# Patient Record
Sex: Female | Born: 1955 | Race: White | Hispanic: No | Marital: Married | Smoking: Former smoker
Health system: Southern US, Community
[De-identification: ages and names within clinical notes are randomized; demographics above are authoritative.]

## PROBLEM LIST (undated history)

## (undated) DIAGNOSIS — J309 Allergic rhinitis, unspecified: Secondary | ICD-10-CM

## (undated) DIAGNOSIS — J209 Acute bronchitis, unspecified: Secondary | ICD-10-CM

## (undated) DIAGNOSIS — I10 Essential (primary) hypertension: Secondary | ICD-10-CM

## (undated) DIAGNOSIS — F39 Unspecified mood [affective] disorder: Secondary | ICD-10-CM

## (undated) DIAGNOSIS — K430 Incisional hernia with obstruction, without gangrene: Secondary | ICD-10-CM

## (undated) DIAGNOSIS — L02219 Cutaneous abscess of trunk, unspecified: Secondary | ICD-10-CM

## (undated) DIAGNOSIS — R0602 Shortness of breath: Secondary | ICD-10-CM

## (undated) DIAGNOSIS — M79609 Pain in unspecified limb: Secondary | ICD-10-CM

## (undated) DIAGNOSIS — Z87891 Personal history of nicotine dependence: Secondary | ICD-10-CM

## (undated) DIAGNOSIS — E669 Obesity, unspecified: Secondary | ICD-10-CM

## (undated) DIAGNOSIS — G4700 Insomnia, unspecified: Secondary | ICD-10-CM

## (undated) DIAGNOSIS — K219 Gastro-esophageal reflux disease without esophagitis: Secondary | ICD-10-CM

## (undated) DIAGNOSIS — J019 Acute sinusitis, unspecified: Secondary | ICD-10-CM

## (undated) DIAGNOSIS — L03319 Cellulitis of trunk, unspecified: Secondary | ICD-10-CM

## (undated) DIAGNOSIS — R05 Cough: Secondary | ICD-10-CM

## (undated) HISTORY — DX: Unspecified mood (affective) disorder: F39

## (undated) HISTORY — DX: Personal history of nicotine dependence: Z87.891

## (undated) HISTORY — PX: CHOLECYSTECTOMY: SHX55

## (undated) HISTORY — DX: Cough: R05

## (undated) HISTORY — DX: Obesity, unspecified: E66.9

## (undated) HISTORY — DX: Gastro-esophageal reflux disease without esophagitis: K21.9

## (undated) HISTORY — DX: Acute bronchitis, unspecified: J20.9

## (undated) HISTORY — DX: Incisional hernia with obstruction, without gangrene: K43.0

## (undated) HISTORY — DX: Cellulitis of trunk, unspecified: L03.319

## (undated) HISTORY — DX: Shortness of breath: R06.02

## (undated) HISTORY — DX: Allergic rhinitis, unspecified: J30.9

## (undated) HISTORY — DX: Cutaneous abscess of trunk, unspecified: L02.219

## (undated) HISTORY — PX: ABDOMINAL HYSTERECTOMY: SHX81

## (undated) HISTORY — PX: ERCP: SHX60

## (undated) HISTORY — DX: Acute sinusitis, unspecified: J01.90

## (undated) HISTORY — DX: Essential (primary) hypertension: I10

## (undated) HISTORY — DX: Insomnia, unspecified: G47.00

## (undated) HISTORY — DX: Pain in unspecified limb: M79.609

---

## 1999-11-10 ENCOUNTER — Encounter (INDEPENDENT_AMBULATORY_CARE_PROVIDER_SITE_OTHER): Payer: Self-pay

## 1999-11-10 ENCOUNTER — Other Ambulatory Visit: Admission: RE | Admit: 1999-11-10 | Discharge: 1999-11-10 | Payer: Self-pay | Admitting: *Deleted

## 2001-07-18 ENCOUNTER — Encounter: Payer: Self-pay | Admitting: Obstetrics and Gynecology

## 2001-07-18 ENCOUNTER — Encounter: Admission: RE | Admit: 2001-07-18 | Discharge: 2001-07-18 | Payer: Self-pay | Admitting: Obstetrics and Gynecology

## 2004-08-24 ENCOUNTER — Ambulatory Visit: Payer: Self-pay | Admitting: Gastroenterology

## 2004-09-07 ENCOUNTER — Ambulatory Visit: Payer: Self-pay | Admitting: Gastroenterology

## 2004-09-10 ENCOUNTER — Ambulatory Visit: Payer: Self-pay | Admitting: Gastroenterology

## 2004-09-10 ENCOUNTER — Observation Stay (HOSPITAL_COMMUNITY): Admission: RE | Admit: 2004-09-10 | Discharge: 2004-09-11 | Payer: Self-pay | Admitting: Gastroenterology

## 2004-09-14 ENCOUNTER — Ambulatory Visit: Payer: Self-pay | Admitting: Gastroenterology

## 2004-09-14 ENCOUNTER — Ambulatory Visit (HOSPITAL_COMMUNITY): Admission: RE | Admit: 2004-09-14 | Discharge: 2004-09-14 | Payer: Self-pay | Admitting: Gastroenterology

## 2004-09-28 ENCOUNTER — Ambulatory Visit: Payer: Self-pay | Admitting: Gastroenterology

## 2004-11-30 ENCOUNTER — Inpatient Hospital Stay (HOSPITAL_COMMUNITY): Admission: EM | Admit: 2004-11-30 | Discharge: 2004-12-17 | Payer: Self-pay | Admitting: *Deleted

## 2004-11-30 ENCOUNTER — Ambulatory Visit (HOSPITAL_COMMUNITY): Admission: RE | Admit: 2004-11-30 | Discharge: 2004-11-30 | Payer: Self-pay | Admitting: Gastroenterology

## 2004-11-30 ENCOUNTER — Ambulatory Visit: Payer: Self-pay | Admitting: Gastroenterology

## 2004-12-04 ENCOUNTER — Ambulatory Visit: Payer: Self-pay | Admitting: Internal Medicine

## 2004-12-20 ENCOUNTER — Ambulatory Visit (HOSPITAL_COMMUNITY): Admission: RE | Admit: 2004-12-20 | Discharge: 2004-12-20 | Payer: Self-pay | Admitting: Gastroenterology

## 2004-12-22 ENCOUNTER — Ambulatory Visit: Payer: Self-pay | Admitting: Gastroenterology

## 2004-12-28 ENCOUNTER — Encounter: Admission: RE | Admit: 2004-12-28 | Discharge: 2004-12-28 | Payer: Self-pay | Admitting: Surgery

## 2005-01-05 ENCOUNTER — Inpatient Hospital Stay (HOSPITAL_COMMUNITY): Admission: EM | Admit: 2005-01-05 | Discharge: 2005-01-15 | Payer: Self-pay | Admitting: Emergency Medicine

## 2005-01-07 ENCOUNTER — Ambulatory Visit: Payer: Self-pay | Admitting: Internal Medicine

## 2005-01-19 ENCOUNTER — Ambulatory Visit (HOSPITAL_COMMUNITY): Admission: RE | Admit: 2005-01-19 | Discharge: 2005-01-19 | Payer: Self-pay

## 2005-01-21 ENCOUNTER — Ambulatory Visit (HOSPITAL_COMMUNITY): Admission: RE | Admit: 2005-01-21 | Discharge: 2005-01-21 | Payer: Self-pay

## 2005-01-28 ENCOUNTER — Ambulatory Visit (HOSPITAL_COMMUNITY): Admission: AD | Admit: 2005-01-28 | Discharge: 2005-01-29 | Payer: Self-pay | Admitting: *Deleted

## 2005-02-01 ENCOUNTER — Inpatient Hospital Stay (HOSPITAL_COMMUNITY): Admission: EM | Admit: 2005-02-01 | Discharge: 2005-02-10 | Payer: Self-pay | Admitting: Emergency Medicine

## 2005-02-01 ENCOUNTER — Ambulatory Visit: Payer: Self-pay | Admitting: Gastroenterology

## 2005-03-02 ENCOUNTER — Ambulatory Visit: Payer: Self-pay | Admitting: Gastroenterology

## 2005-03-18 ENCOUNTER — Ambulatory Visit: Payer: Self-pay | Admitting: Internal Medicine

## 2005-03-23 ENCOUNTER — Ambulatory Visit: Payer: Self-pay | Admitting: Internal Medicine

## 2005-03-25 ENCOUNTER — Ambulatory Visit: Payer: Self-pay | Admitting: Cardiology

## 2005-04-04 ENCOUNTER — Ambulatory Visit: Payer: Self-pay | Admitting: Gastroenterology

## 2005-04-06 ENCOUNTER — Ambulatory Visit: Payer: Self-pay | Admitting: Internal Medicine

## 2005-04-13 ENCOUNTER — Ambulatory Visit: Payer: Self-pay | Admitting: Internal Medicine

## 2005-04-18 ENCOUNTER — Ambulatory Visit (HOSPITAL_COMMUNITY): Admission: RE | Admit: 2005-04-18 | Discharge: 2005-04-19 | Payer: Self-pay | Admitting: *Deleted

## 2005-05-03 ENCOUNTER — Encounter: Admission: RE | Admit: 2005-05-03 | Discharge: 2005-05-03 | Payer: Self-pay | Admitting: General Surgery

## 2005-05-12 ENCOUNTER — Ambulatory Visit: Payer: Self-pay | Admitting: Gastroenterology

## 2005-06-13 ENCOUNTER — Ambulatory Visit: Payer: Self-pay | Admitting: Internal Medicine

## 2005-08-08 ENCOUNTER — Ambulatory Visit (HOSPITAL_COMMUNITY): Admission: RE | Admit: 2005-08-08 | Discharge: 2005-08-09 | Payer: Self-pay | Admitting: *Deleted

## 2005-09-08 ENCOUNTER — Encounter: Admission: RE | Admit: 2005-09-08 | Discharge: 2005-09-08 | Payer: Self-pay | Admitting: *Deleted

## 2005-09-09 ENCOUNTER — Ambulatory Visit (HOSPITAL_COMMUNITY): Admission: RE | Admit: 2005-09-09 | Discharge: 2005-09-09 | Payer: Self-pay | Admitting: *Deleted

## 2005-09-13 ENCOUNTER — Ambulatory Visit (HOSPITAL_COMMUNITY): Admission: RE | Admit: 2005-09-13 | Discharge: 2005-09-13 | Payer: Self-pay | Admitting: Interventional Radiology

## 2005-10-03 ENCOUNTER — Encounter: Admission: RE | Admit: 2005-10-03 | Discharge: 2005-10-03 | Payer: Self-pay | Admitting: *Deleted

## 2005-10-04 ENCOUNTER — Ambulatory Visit (HOSPITAL_COMMUNITY): Admission: RE | Admit: 2005-10-04 | Discharge: 2005-10-04 | Payer: Self-pay | Admitting: Interventional Radiology

## 2005-10-06 ENCOUNTER — Ambulatory Visit (HOSPITAL_COMMUNITY): Admission: RE | Admit: 2005-10-06 | Discharge: 2005-10-06 | Payer: Self-pay | Admitting: *Deleted

## 2005-10-06 IMAGING — CT CT ABDOMEN W/ CM
1 of 4 series · 13 of 32 positions shown, 18 images · IV contrast (omnipaque)
Comparison: 01/05/2005.

CLINICAL DATA: 48-year-old with abdominal abscess.    
ABDOMEN CT WITH CONTRAST:
TECHNIQUE: Multidetector CT imaging of the abdomen was performed following the standard protocol during bolus administration of intravenous contrast.
Contrast:  125 cc Omnipaque 300 and oral contrast.
TECHNIQUE: Multidetector CT imaging of the pelvis was performed following the standard protocol during bolus administration of intravenous contrast.  
The uterus is present.  There appears to have been incision and drainage of anterior abdominal wall collection which has now resolved.  Post-operative changes are seen in the lower abdominal midline.  Contiguous abscess in the right hemipelvis is now smaller, now measuring 2.6 x 7.5 cm.  Previously this measured 3.6 x 8.9 cm.  The uterus is present.  No adnexal mass identified.  Right abdominal and retroperitoneal abscess extend into the pelvis to the level of the symphysis.

[Series 2: abd_pel 5.0 b40f st · axial · 0.59mm/px · z∈[-420,-35]mm · 13 of 87 slices shown, 18 images]
[im 5/87  soft-tissue]
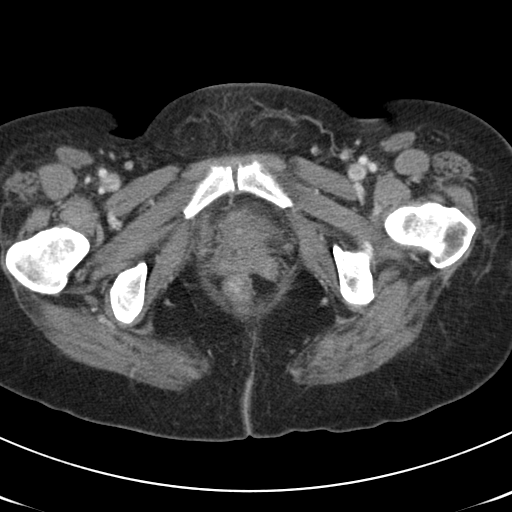
[im 5/87  bone]
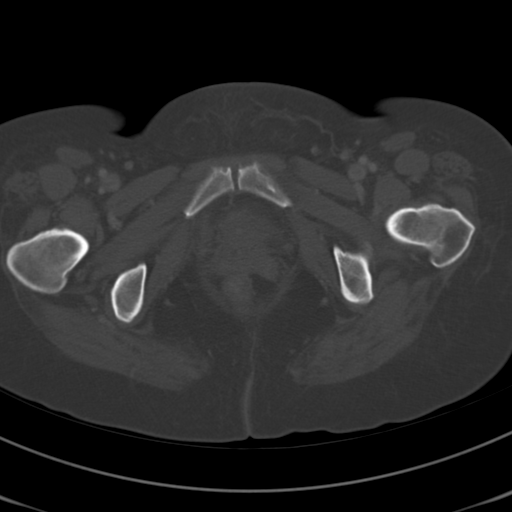
[im 15/87  soft-tissue]
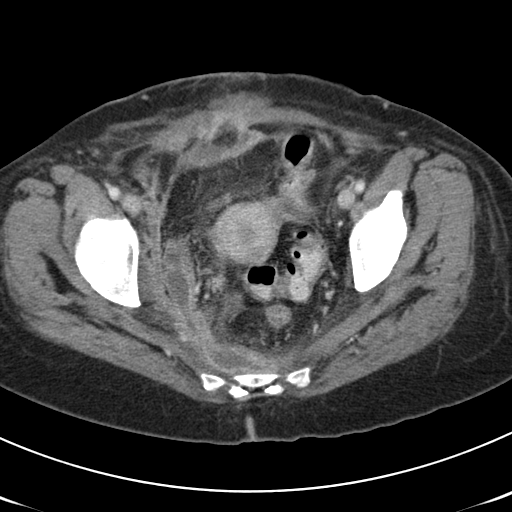
[im 20/87  soft-tissue]
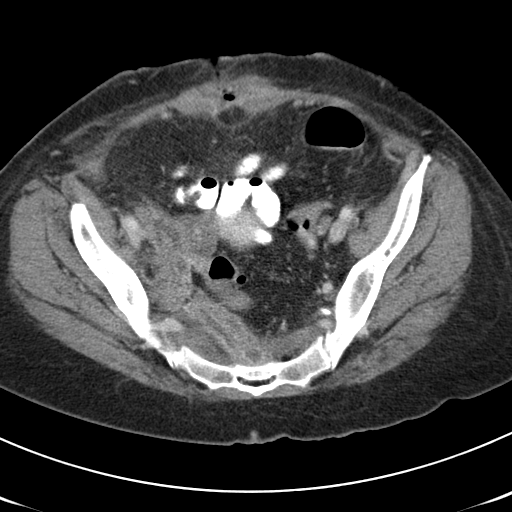
[im 24/87  soft-tissue]
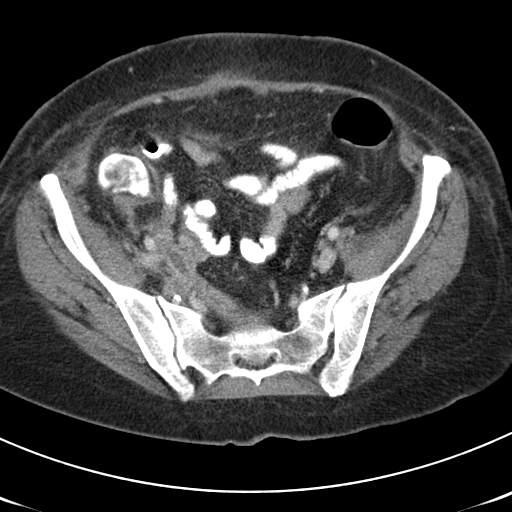
[im 34/87  soft-tissue]
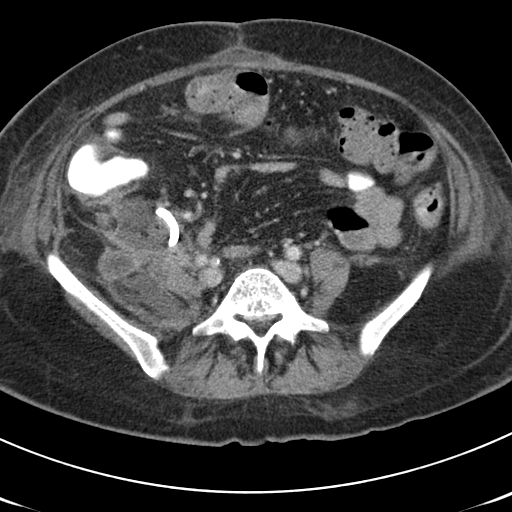
[im 39/87  soft-tissue]
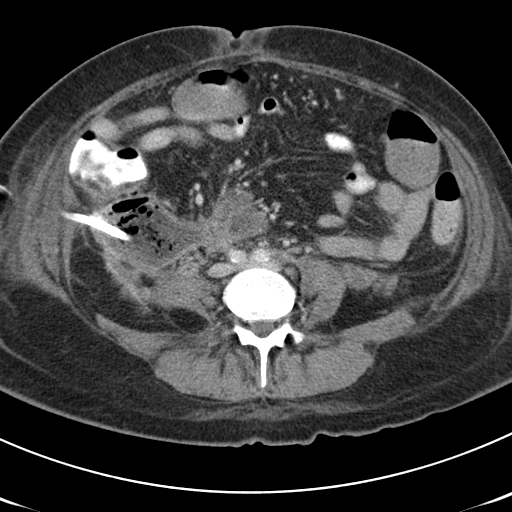
[im 48/87  soft-tissue]
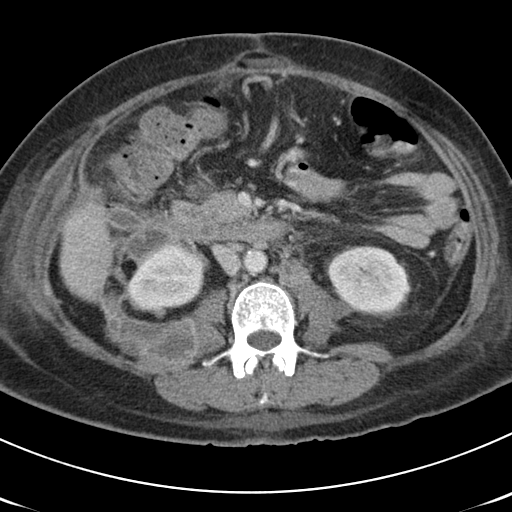
[im 53/87  soft-tissue]
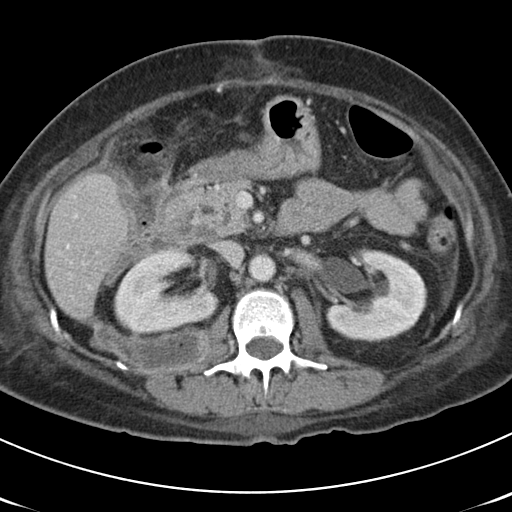
[im 63/87  soft-tissue]
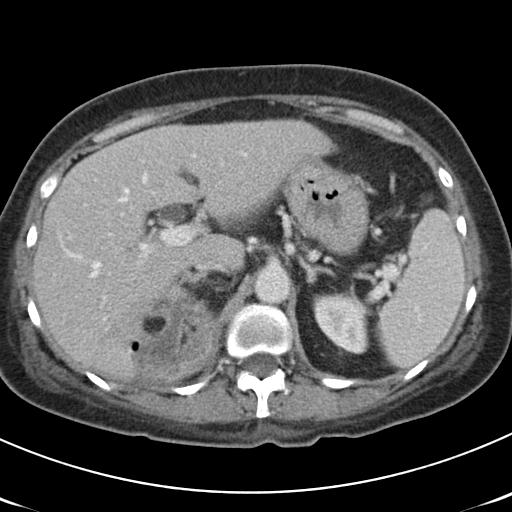
[im 63/87  bone]
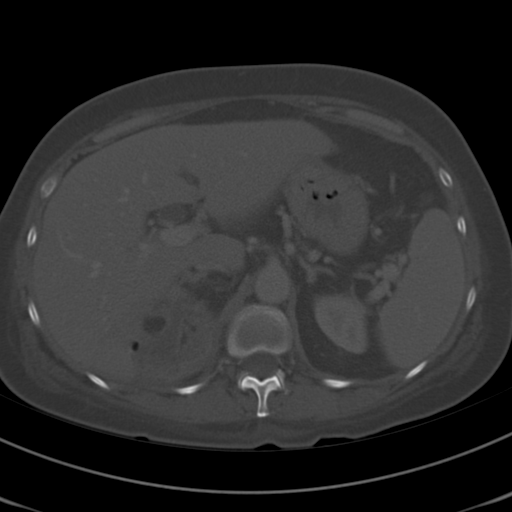
[im 67/87  soft-tissue]
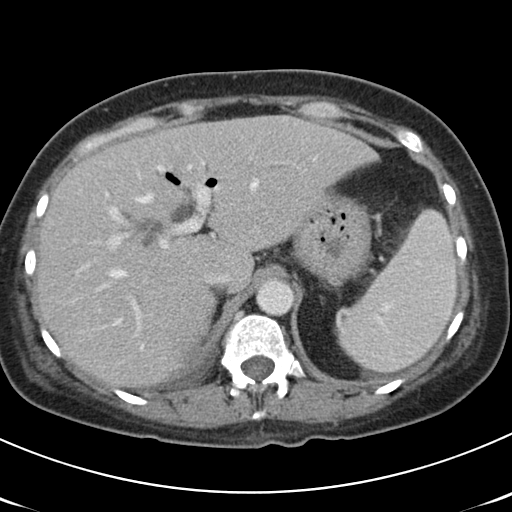
[im 67/87  lung]
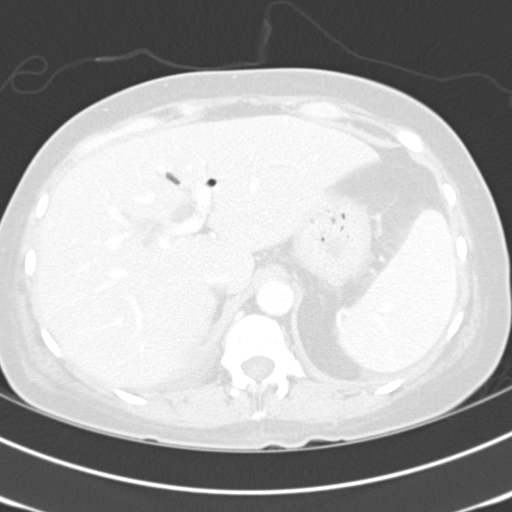
[im 72/87  soft-tissue]
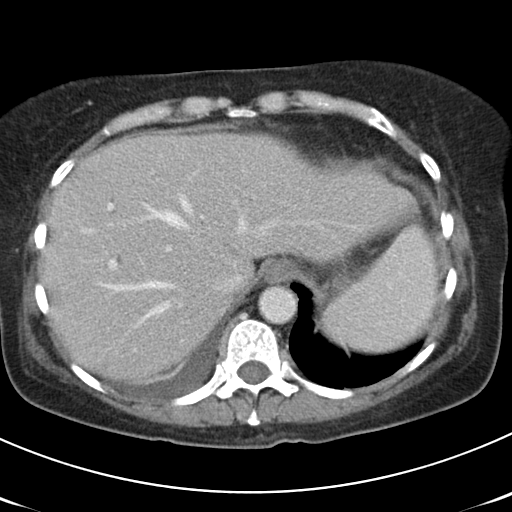
[im 72/87  lung]
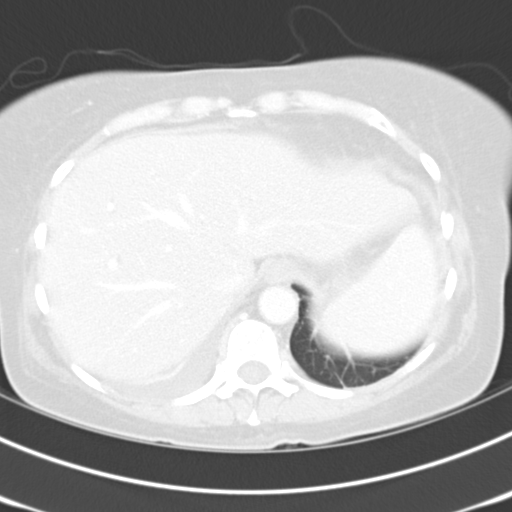
[im 77/87  lung]
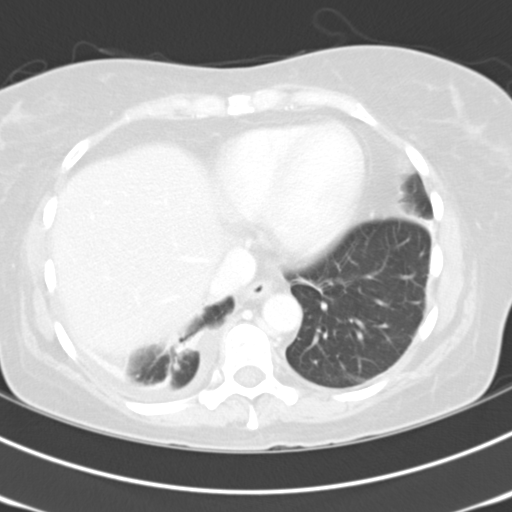
[im 82/87  soft-tissue]
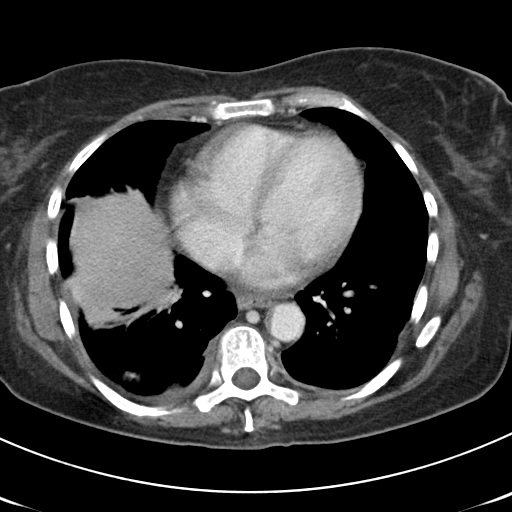
[im 82/87  lung]
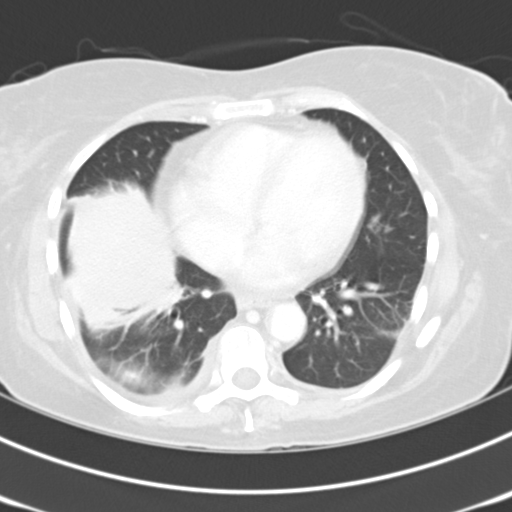

[13 of 32 positions shown; findings below may reference images not displayed]

Images of the lung bases show patchy areas of atelectasis bilaterally.  There continues to be dilatation of the common bile duct, status post cholecystectomy.  A small amount of air is seen in the biliary tract today.  There has been decrease in size of right mid-abdominal abscess.  This now measures 7.4 x 4.5 cm and contains a pigtail catheter.  There continues to be loculated air and fluid collection posterior to the right kidney, measuring a maximum diameter of 6.1 x 2.2 x 12 cm.  This is probably stable in size since the previous exam but does contain more air.  Scattered low density probable cysts are seen in the liver.   No focal abnormality is seen within the spleen, pancreas, or left adrenal gland.  The right adrenal gland is difficult to visualize.  Gas and fluid are seen within the gallbladder fossa region.
IMPRESSION: 1.  Air within the biliary tree, consistent with prior cholecystectomy.  
2.  Smaller size of right mid-abdominal abscess following placement of a pigtail catheter within this collection.  There continues to be significant retroperitoneal fluid, associated with gas especially posterior to the right kidney but also extending inferiorly into the right pelvis.   Some of these fluid collections are associated with more gas on the current study. 
PELVIS CT WITH CONTRAST:
IMPRESSION: Interval incision and drainage of abdominal wall fluid collection.  Post-operative changes are seen in this region.  There is a smaller pelvic component of retroperitoneal abscess.

## 2005-10-10 ENCOUNTER — Ambulatory Visit (HOSPITAL_COMMUNITY): Admission: RE | Admit: 2005-10-10 | Discharge: 2005-10-10 | Payer: Self-pay | Admitting: *Deleted

## 2005-11-01 ENCOUNTER — Ambulatory Visit (HOSPITAL_COMMUNITY): Admission: RE | Admit: 2005-11-01 | Discharge: 2005-11-01 | Payer: Self-pay | Admitting: *Deleted

## 2005-11-09 ENCOUNTER — Ambulatory Visit (HOSPITAL_COMMUNITY): Admission: RE | Admit: 2005-11-09 | Discharge: 2005-11-09 | Payer: Self-pay | Admitting: *Deleted

## 2005-12-07 ENCOUNTER — Encounter: Admission: RE | Admit: 2005-12-07 | Discharge: 2005-12-07 | Payer: Self-pay | Admitting: *Deleted

## 2006-02-22 ENCOUNTER — Ambulatory Visit (HOSPITAL_COMMUNITY): Admission: RE | Admit: 2006-02-22 | Discharge: 2006-02-23 | Payer: Self-pay | Admitting: *Deleted

## 2006-09-07 ENCOUNTER — Emergency Department (HOSPITAL_COMMUNITY): Admission: EM | Admit: 2006-09-07 | Discharge: 2006-09-07 | Payer: Self-pay | Admitting: Family Medicine

## 2006-10-16 ENCOUNTER — Ambulatory Visit: Payer: Self-pay | Admitting: Internal Medicine

## 2007-02-19 ENCOUNTER — Encounter: Payer: Self-pay | Admitting: Internal Medicine

## 2007-02-19 DIAGNOSIS — K430 Incisional hernia with obstruction, without gangrene: Secondary | ICD-10-CM | POA: Insufficient documentation

## 2007-02-19 DIAGNOSIS — L02219 Cutaneous abscess of trunk, unspecified: Secondary | ICD-10-CM

## 2007-02-19 DIAGNOSIS — K219 Gastro-esophageal reflux disease without esophagitis: Secondary | ICD-10-CM

## 2007-02-19 DIAGNOSIS — L03319 Cellulitis of trunk, unspecified: Secondary | ICD-10-CM | POA: Insufficient documentation

## 2007-02-19 HISTORY — DX: Cutaneous abscess of trunk, unspecified: L03.319

## 2007-02-19 HISTORY — DX: Incisional hernia with obstruction, without gangrene: K43.0

## 2007-02-19 HISTORY — DX: Gastro-esophageal reflux disease without esophagitis: K21.9

## 2007-02-19 HISTORY — DX: Cutaneous abscess of trunk, unspecified: L02.219

## 2008-02-15 ENCOUNTER — Ambulatory Visit: Payer: Self-pay | Admitting: Internal Medicine

## 2008-02-15 DIAGNOSIS — I10 Essential (primary) hypertension: Secondary | ICD-10-CM

## 2008-02-15 DIAGNOSIS — G4700 Insomnia, unspecified: Secondary | ICD-10-CM | POA: Insufficient documentation

## 2008-02-15 HISTORY — DX: Insomnia, unspecified: G47.00

## 2008-02-15 HISTORY — DX: Essential (primary) hypertension: I10

## 2008-02-19 LAB — CONVERTED CEMR LAB
ALT: 26 units/L (ref 0–35)
AST: 20 units/L (ref 0–37)
Albumin: 4.1 g/dL (ref 3.5–5.2)
Alkaline Phosphatase: 120 units/L — ABNORMAL HIGH (ref 39–117)
BUN: 16 mg/dL (ref 6–23)
Bilirubin Urine: NEGATIVE
Bilirubin, Direct: 0.1 mg/dL (ref 0.0–0.3)
CO2: 28 meq/L (ref 19–32)
Calcium: 9.7 mg/dL (ref 8.4–10.5)
Chloride: 109 meq/L (ref 96–112)
Creatinine, Ser: 0.8 mg/dL (ref 0.4–1.2)
GFR calc Af Amer: 98 mL/min
GFR calc non Af Amer: 81 mL/min
Glucose, Bld: 111 mg/dL — ABNORMAL HIGH (ref 70–99)
Ketones, ur: NEGATIVE mg/dL
Leukocytes, UA: NEGATIVE
Nitrite: NEGATIVE
Potassium: 3.8 meq/L (ref 3.5–5.1)
Sodium: 143 meq/L (ref 135–145)
Specific Gravity, Urine: 1.015 (ref 1.000–1.03)
TSH: 0.96 microintl units/mL (ref 0.35–5.50)
Total Bilirubin: 0.8 mg/dL (ref 0.3–1.2)
Total Protein, Urine: NEGATIVE mg/dL
Total Protein: 7.2 g/dL (ref 6.0–8.3)
Urine Glucose: NEGATIVE mg/dL
Urobilinogen, UA: 0.2 (ref 0.0–1.0)
Vitamin B-12: 346 pg/mL (ref 211–911)
pH: 6.5 (ref 5.0–8.0)

## 2008-02-21 LAB — CONVERTED CEMR LAB: Vit D, 1,25-Dihydroxy: 25 — ABNORMAL LOW (ref 30–89)

## 2008-06-13 ENCOUNTER — Ambulatory Visit: Payer: Self-pay | Admitting: Internal Medicine

## 2008-06-13 DIAGNOSIS — R059 Cough, unspecified: Secondary | ICD-10-CM | POA: Insufficient documentation

## 2008-06-13 DIAGNOSIS — J209 Acute bronchitis, unspecified: Secondary | ICD-10-CM

## 2008-06-13 DIAGNOSIS — R05 Cough: Secondary | ICD-10-CM

## 2008-06-13 HISTORY — DX: Cough, unspecified: R05.9

## 2008-06-13 HISTORY — DX: Acute bronchitis, unspecified: J20.9

## 2008-06-18 ENCOUNTER — Telehealth: Payer: Self-pay | Admitting: Internal Medicine

## 2008-07-02 ENCOUNTER — Telehealth (INDEPENDENT_AMBULATORY_CARE_PROVIDER_SITE_OTHER): Payer: Self-pay | Admitting: *Deleted

## 2008-07-09 ENCOUNTER — Telehealth: Payer: Self-pay | Admitting: Internal Medicine

## 2008-10-22 ENCOUNTER — Telehealth (INDEPENDENT_AMBULATORY_CARE_PROVIDER_SITE_OTHER): Payer: Self-pay | Admitting: *Deleted

## 2008-10-24 ENCOUNTER — Ambulatory Visit: Payer: Self-pay | Admitting: Internal Medicine

## 2008-10-24 DIAGNOSIS — J309 Allergic rhinitis, unspecified: Secondary | ICD-10-CM

## 2008-10-24 DIAGNOSIS — J019 Acute sinusitis, unspecified: Secondary | ICD-10-CM

## 2008-10-24 HISTORY — DX: Acute sinusitis, unspecified: J01.90

## 2008-10-24 HISTORY — DX: Allergic rhinitis, unspecified: J30.9

## 2008-10-29 ENCOUNTER — Telehealth: Payer: Self-pay | Admitting: Internal Medicine

## 2008-12-02 ENCOUNTER — Telehealth: Payer: Self-pay | Admitting: Internal Medicine

## 2008-12-17 ENCOUNTER — Telehealth: Payer: Self-pay | Admitting: Internal Medicine

## 2008-12-30 ENCOUNTER — Telehealth: Payer: Self-pay | Admitting: Internal Medicine

## 2009-02-13 ENCOUNTER — Ambulatory Visit: Payer: Self-pay | Admitting: Internal Medicine

## 2009-02-13 LAB — CONVERTED CEMR LAB
ALT: 19 units/L (ref 0–35)
AST: 21 units/L (ref 0–37)
Albumin: 4.1 g/dL (ref 3.5–5.2)
Alkaline Phosphatase: 106 units/L (ref 39–117)
BUN: 19 mg/dL (ref 6–23)
Bilirubin, Direct: 0.1 mg/dL (ref 0.0–0.3)
CO2: 27 meq/L (ref 19–32)
Calcium: 9.3 mg/dL (ref 8.4–10.5)
Chloride: 109 meq/L (ref 96–112)
Creatinine, Ser: 0.9 mg/dL (ref 0.4–1.2)
GFR calc non Af Amer: 69.89 mL/min (ref >60)
Glucose, Bld: 106 mg/dL — ABNORMAL HIGH (ref 70–99)
Potassium: 3.8 meq/L (ref 3.5–5.1)
Sodium: 144 meq/L (ref 135–145)
TSH: 1.55 microintl units/mL (ref 0.35–5.50)
Total Bilirubin: 0.8 mg/dL (ref 0.3–1.2)
Total Protein: 7.4 g/dL (ref 6.0–8.3)

## 2009-02-20 ENCOUNTER — Ambulatory Visit: Payer: Self-pay | Admitting: Internal Medicine

## 2009-02-20 DIAGNOSIS — E669 Obesity, unspecified: Secondary | ICD-10-CM

## 2009-02-20 DIAGNOSIS — F39 Unspecified mood [affective] disorder: Secondary | ICD-10-CM

## 2009-02-20 HISTORY — DX: Unspecified mood (affective) disorder: F39

## 2009-02-20 HISTORY — DX: Obesity, unspecified: E66.9

## 2009-09-07 ENCOUNTER — Ambulatory Visit: Payer: Self-pay | Admitting: Internal Medicine

## 2009-09-07 LAB — CONVERTED CEMR LAB
BUN: 20 mg/dL (ref 6–23)
CO2: 29 meq/L (ref 19–32)
Calcium: 9.7 mg/dL (ref 8.4–10.5)
Chloride: 103 meq/L (ref 96–112)
Creatinine, Ser: 0.9 mg/dL (ref 0.4–1.2)
GFR calc non Af Amer: 69.74 mL/min (ref >60)
Glucose, Bld: 101 mg/dL — ABNORMAL HIGH (ref 70–99)
Potassium: 3.7 meq/L (ref 3.5–5.1)
Sodium: 143 meq/L (ref 135–145)

## 2009-09-10 ENCOUNTER — Ambulatory Visit: Payer: Self-pay | Admitting: Internal Medicine

## 2009-09-10 DIAGNOSIS — Z87891 Personal history of nicotine dependence: Secondary | ICD-10-CM | POA: Insufficient documentation

## 2009-09-10 HISTORY — DX: Personal history of nicotine dependence: Z87.891

## 2009-10-29 ENCOUNTER — Telehealth: Payer: Self-pay | Admitting: Internal Medicine

## 2010-03-31 ENCOUNTER — Ambulatory Visit: Payer: Self-pay | Admitting: Internal Medicine

## 2010-03-31 LAB — CONVERTED CEMR LAB
ALT: 23 units/L (ref 0–35)
AST: 22 units/L (ref 0–37)
Albumin: 4.5 g/dL (ref 3.5–5.2)
Alkaline Phosphatase: 99 units/L (ref 39–117)
BUN: 17 mg/dL (ref 6–23)
Basophils Absolute: 0.1 10*3/uL (ref 0.0–0.1)
Basophils Relative: 0.8 % (ref 0.0–3.0)
Bilirubin Urine: NEGATIVE
Bilirubin, Direct: 0.1 mg/dL (ref 0.0–0.3)
CO2: 26 meq/L (ref 19–32)
Calcium: 10.1 mg/dL (ref 8.4–10.5)
Chloride: 105 meq/L (ref 96–112)
Creatinine, Ser: 0.9 mg/dL (ref 0.4–1.2)
Eosinophils Absolute: 0.4 10*3/uL (ref 0.0–0.7)
Eosinophils Relative: 3.6 % (ref 0.0–5.0)
GFR calc non Af Amer: 73.34 mL/min (ref >60)
Glucose, Bld: 94 mg/dL (ref 70–99)
HCT: 41.1 % (ref 36.0–46.0)
Hemoglobin: 14.2 g/dL (ref 12.0–15.0)
Ketones, ur: NEGATIVE mg/dL
Lymphocytes Relative: 27.3 % (ref 12.0–46.0)
Lymphs Abs: 2.7 10*3/uL (ref 0.7–4.0)
MCHC: 34.6 g/dL (ref 30.0–36.0)
MCV: 92.8 fL (ref 78.0–100.0)
Monocytes Absolute: 0.5 10*3/uL (ref 0.1–1.0)
Monocytes Relative: 5.3 % (ref 3.0–12.0)
Neutro Abs: 6.3 10*3/uL (ref 1.4–7.7)
Neutrophils Relative %: 63 % (ref 43.0–77.0)
Nitrite: NEGATIVE
Platelets: 329 10*3/uL (ref 150.0–400.0)
Potassium: 4.1 meq/L (ref 3.5–5.1)
RBC: 4.43 M/uL (ref 3.87–5.11)
RDW: 13.2 % (ref 11.5–14.6)
Sodium: 141 meq/L (ref 135–145)
Specific Gravity, Urine: 1.025 (ref 1.000–1.030)
TSH: 0.83 microintl units/mL (ref 0.35–5.50)
Total Bilirubin: 0.7 mg/dL (ref 0.3–1.2)
Total Protein, Urine: NEGATIVE mg/dL
Total Protein: 7.4 g/dL (ref 6.0–8.3)
Urine Glucose: NEGATIVE mg/dL
Urobilinogen, UA: 0.2 (ref 0.0–1.0)
WBC: 9.9 10*3/uL (ref 4.5–10.5)
pH: 6 (ref 5.0–8.0)

## 2010-04-02 ENCOUNTER — Telehealth: Payer: Self-pay | Admitting: Internal Medicine

## 2010-04-02 ENCOUNTER — Ambulatory Visit: Payer: Self-pay | Admitting: Internal Medicine

## 2010-04-02 DIAGNOSIS — M79609 Pain in unspecified limb: Secondary | ICD-10-CM

## 2010-04-02 HISTORY — DX: Pain in unspecified limb: M79.609

## 2010-04-12 ENCOUNTER — Telehealth: Payer: Self-pay | Admitting: Internal Medicine

## 2010-05-07 ENCOUNTER — Ambulatory Visit: Payer: Self-pay | Admitting: Internal Medicine

## 2010-05-07 DIAGNOSIS — R0602 Shortness of breath: Secondary | ICD-10-CM

## 2010-05-07 HISTORY — DX: Shortness of breath: R06.02

## 2010-06-08 ENCOUNTER — Ambulatory Visit: Payer: Self-pay | Admitting: Internal Medicine

## 2010-06-08 LAB — CONVERTED CEMR LAB
Cholesterol, target level: 200 mg/dL
HDL goal, serum: 40 mg/dL
LDL Goal: 160 mg/dL

## 2010-06-22 ENCOUNTER — Telehealth: Payer: Self-pay | Admitting: Internal Medicine

## 2010-06-23 ENCOUNTER — Telehealth: Payer: Self-pay | Admitting: Internal Medicine

## 2010-08-10 NOTE — Progress Notes (Signed)
Summary: ABX?  Phone Note Call from Patient Call back at Home Phone [phone]   Caller: Patient Summary of Call: pt called stating that she woke uo yesterday morning with severe ST and now has cough and ear pain. pt is requesting Rx for ABX, please advise Initial call taken by: Margaret Pyle, CMA,  October 29, 2009 9:57 AM  Follow-up for Phone Call        ok Zpac OV if sick Follow-up by: Tresa Garter MD,  October 29, 2009 1:11 PM  Additional Follow-up for Phone Call Additional follow up Details #1::        Pt informed  Additional Follow-up by: Lamar Sprinkles, CMA,  October 29, 2009 1:58 PM    New/Updated Medications: ZITHROMAX Z-PAK 250 MG TABS (AZITHROMYCIN) as dirrected Prescriptions: ZITHROMAX Z-PAK 250 MG TABS (AZITHROMYCIN) as dirrected  #1 x 0   Entered and Authorized by:   Tresa Garter MD   Signed by:   Lamar Sprinkles, CMA on 10/29/2009   Method used:   Electronically to        [facility] [id number] (retail)       [address]       [place], [place]  [address]       Ph: [phone]       Fax: [phone]   RxID:   [id number]

## 2010-08-10 NOTE — Progress Notes (Signed)
Summary: Throat problems  Phone Note Call from Patient Call back at Home Phone [phone]   Summary of Call: Pt c/o chills & body aches and pain around throat which increases w/coughing. She wants to know if this could be from bp med change. Or if from sinus infection. Initial call taken by: Lamar Sprinkles,  October 29, 2008 5:04 PM  Follow-up for Phone Call        It could be a left over from Benazepril or a URI. Use Ricola. Take Zpac if not better Follow-up by: Tresa Garter MD,  October 30, 2008 1:27 PM  Additional Follow-up for Phone Call Additional follow up Details #1::        Pt informed, She was previously on bp med which caused her to become depressed & teary. It seems that it was the same med that she is on now. C/o depressed mood and crying easily. Pt would like to change to different bp med.  Additional Follow-up by: Lamar Sprinkles,  October 30, 2008 5:49 PM   New Allergies: AMLODIPINE BESYLATE (AMLODIPINE BESYLATE) Additional Follow-up for Phone Call Additional follow up Details #2::    OK to stop Amlodipine Take spironolactone Follow-up by: Tresa Garter MD,  October 31, 2008 8:35 AM  Additional Follow-up for Phone Call Additional follow up Details #3:: Details for Additional Follow-up Action Taken: Pt informed  Additional Follow-up by: Lamar Sprinkles,  October 31, 2008 10:32 AM  New/Updated Medications: ZITHROMAX Z-PAK 250 MG  TABS (AZITHROMYCIN) Use as directed SPIRONOLACTONE 25 MG TABS (SPIRONOLACTONE) 1 by mouth qam New Allergies: AMLODIPINE BESYLATE (AMLODIPINE BESYLATE)  Prescriptions: SPIRONOLACTONE 25 MG TABS (SPIRONOLACTONE) 1 by mouth qam  #30 x 12   Entered and Authorized by:   Tresa Garter MD   Signed by:   Lamar Sprinkles on 10/31/2008   Method used:   Electronically to        [facility] [id number] (retail)       [address]       [place], [place]  [address]       Ph: [phone]       Fax: [phone]   RxID:   [id number]  ZITHROMAX Z-PAK 250 MG  TABS (AZITHROMYCIN) Use as directed  #1 x 0   Entered and Authorized by:   Tresa Garter MD   Signed by:   Lamar Sprinkles on 10/30/2008   Method used:   Electronically to        [facility] [id number] (retail)       [address]       [place], [place]  [address]       Ph: [phone]       Fax: [phone]   RxID:   [id number]

## 2010-08-10 NOTE — Progress Notes (Signed)
Summary: BP MEDS  Phone Note Call from Patient   Summary of Call: Pt wants to stop spironolactone and start the furosemide & potassium. BUT potassium is 78$, she is req cheaper rx.  Initial call taken by: Lamar Sprinkles,  December 30, 2008 2:17 PM  Follow-up for Phone Call        OK by me. Potass should be $4/mo at [facility] generic Follow-up by: Tresa Garter MD,  December 30, 2008 5:28 PM  Additional Follow-up for Phone Call Additional follow up Details #1::        Klorcon 10 is on [facility] list, EMR updated, this that rx ok to send? Additional Follow-up by: Lamar Sprinkles,  December 30, 2008 6:11 PM    Additional Follow-up for Phone Call Additional follow up Details #2::    pt rtc asking about the spironolatone med. making her have sob and heartburn, also that thefluid pill is 4 dollar at [facility], but the potassium is 68.00 and pt wanted to know what Dr. Posey Rea was going to do about her meds.  Follow-up by: Tora Perches,  December 31, 2008 12:14 PM  Additional Follow-up for Phone Call Additional follow up Details #3:: Details for Additional Follow-up Action Taken: Take OTC potassium 2 a day. Stop Spironolact. OV next months with BMET 401.1  Additional Follow-up by: Tresa Garter MD,  December 31, 2008 2:42 PM  New/Updated Medications: KLOR-CON 10 10 MEQ CR-TABS (POTASSIUM CHLORIDE) 1 once daily  Called pt back no ansew LMOM what md advise her to do. also entered labs in comp/LMB

## 2010-08-10 NOTE — Progress Notes (Signed)
Summary: Sinus Problem  Phone Note Call from Patient   Summary of Call: Pt c/o sinus congestion/pressure, worse in am. Patient is requesting to know what dr suggests. She would like rx.  Initial call taken by: Lamar Sprinkles,  October 22, 2008 5:15 PM  Follow-up for Phone Call        OV w/me or Dr Elbert Ewings Follow-up by: Tresa Garter MD,  October 23, 2008 9:28 AM    GAVE PT 10/24/08 @ 10A W/DR PLOTNIKOV-PHONE

## 2010-08-10 NOTE — Progress Notes (Signed)
Summary: ?Side effects  Phone Note Call from Patient Call back at Home Phone [phone]   Caller: Patient Call For: Tresa Garter MD Summary of Call: Pt called and left message c/o headaches and stomach cramps 30 minutes after taking the new BP medication Spironolactone 25mg  Dr. Posey Rea put her on 6 days ago. Wants to know if she needs to change medications or continue to take med and it will go away. Initial call taken by: Zackery Barefoot CMA,  Dec 02, 2008 2:07 PM  Follow-up for Phone Call        If it happened several times after she took her med - stop it and let me know - we will use smthg else Follow-up by: Tresa Garter MD,  Dec 02, 2008 5:14 PM  Additional Follow-up for Phone Call Additional follow up Details #1::        What else can she try? Additional Follow-up by: Lamar Sprinkles,  Dec 03, 2008 10:01 AM   New Allergies: SPIRONOLACTONE (SPIRONOLACTONE) Additional Follow-up for Phone Call Additional follow up Details #2::    Furosemide and KCL Follow-up by: Tresa Garter MD,  Dec 03, 2008 5:21 PM  Additional Follow-up for Phone Call Additional follow up Details #3:: Details for Additional Follow-up Action Taken: Patient is requesting to continue med. Says she feels better today. She will keep taking spironolactone. She is concerned about the side effects, especially one stating could cause tumors. Pt will change to new meds if symptoms persist or return......................Marland KitchenLamar Sprinkles  Dec 03, 2008 5:50 PM     Noted. Thx! Georgina Quint Khandi Kernes MD  Dec 04, 2008 7:37 AM   New/Updated Medications: FUROSEMIDE 20 MG TABS (FUROSEMIDE) Take 1 tab by mouth every day POTASSIUM CHLORIDE CR 10 MEQ  TBCR (POTASSIUM CHLORIDE) 1 by mouth qd New Allergies: SPIRONOLACTONE (SPIRONOLACTONE)  Prescriptions: POTASSIUM CHLORIDE CR 10 MEQ  TBCR (POTASSIUM CHLORIDE) 1 by mouth qd  #90 x 3   Entered and Authorized by:   Tresa Garter MD   Signed by:    Lamar Sprinkles on 12/03/2008   Method used:   Electronically to        [facility] [id number] (retail)       [address]       [place], [place]  [address]       Ph: [phone]       Fax: [phone]   RxID:   [id number] FUROSEMIDE 20 MG TABS (FUROSEMIDE) Take 1 tab by mouth every day  #90 x 3   Entered and Authorized by:   Tresa Garter MD   Signed by:   Lamar Sprinkles on 12/03/2008   Method used:   Electronically to        [facility] [id number] (retail)       [address]       [place], [place]  [address]       Ph: [phone]       Fax: [phone]   RxID:   [id number]

## 2010-08-10 NOTE — Progress Notes (Signed)
Summary: Spironolactone  Phone Note Call from Patient   Summary of Call: 1.Patient having side effects from Spironolactone 25mg : SOB, but this has stopped; Hacking cough daily; Hickups daily. Patient has had issues with this med before (read last phone note). Pls. advise   2.Patient also wants something cheaper than Rx for Potassium. Initial call taken by: Beola Cord, CMA,  December 17, 2008 10:56 AM  Follow-up for Phone Call        she needs to be seen for lytes, blood sugar, renal function testing Follow-up by: Etta Grandchild MD,  December 17, 2008 11:14 AM  Additional Follow-up for Phone Call Additional follow up Details #1::        Patient notified. Patient to schedule own appointment. Additional Follow-up by: Beola Cord, CMA,  December 17, 2008 3:49 PM

## 2010-08-10 NOTE — Progress Notes (Signed)
Summary: ALT BP MED  Phone Note Call from Patient Call back at Home Phone [phone]   Summary of Call: Patient can not use the [employer] discount card - she has medicare. She is req alt rx for generic med.  Initial call taken by: Lamar Sprinkles, CMA,  April 02, 2010 4:47 PM  Follow-up for Phone Call        ok Cozaar Follow-up by: Tresa Garter MD,  April 02, 2010 5:53 PM  Additional Follow-up for Phone Call Additional follow up Details #1::        Pt informed  Additional Follow-up by: Lamar Sprinkles, CMA,  April 02, 2010 6:32 PM    New/Updated Medications: COZAAR 100 MG TABS (LOSARTAN POTASSIUM) 1 by mouth qd Prescriptions: COZAAR 100 MG TABS (LOSARTAN POTASSIUM) 1 by mouth qd  #90 x 3   Entered and Authorized by:   Tresa Garter MD   Signed by:   Lamar Sprinkles, CMA on 04/02/2010   Method used:   Electronically to        [facility] [id number] (retail)       [address]       [place], [place]  [address]       Ph: [phone]       Fax: [phone]   RxID:   [id number]

## 2010-08-10 NOTE — Assessment & Plan Note (Signed)
Summary: 1 MO ROV /NWS  #   Vital Signs:  Patient profile:   55 year old female Height:      62 inches Weight:      154 pounds BMI:     28.27 Temp:     97.5 degrees F oral Pulse rate:   80 / minute Pulse rhythm:   regular Resp:     16 per minute BP sitting:   150 / 100  (left arm) Cuff size:   regular  Vitals Entered By: Lanier Prude, CMA(AAMA) (June 08, 2010 1:23 PM) CC: 1 mo f/u  c/o increased anxiety and irritability/mood swings, Lipid Management Is Patient Diabetic? No   CC:  1 mo f/u  c/o increased anxiety and irritability/mood swings and Lipid Management.  History of Present Illness: F/u HTN C/o anxiety, mood swings  Lipid Management History:      Positive NCEP/ATP III risk factors include hypertension.  Negative NCEP/ATP III risk factors include female age less than 62 years old and non-tobacco-user status.    Current Medications (verified): 1)  Vitamin D3 1000 Unit  Tabs (Cholecalciferol) .Marland Kitchen.. 1 By Mouth Daily 2)  Loratadine 10 Mg  Tabs (Loratadine) .... Once Daily As Needed Allergies 3)  Fluticasone Propionate 50 Mcg/act  Susp (Fluticasone Propionate) .... 2 Sprays Each Nostril Once Daily 4)  Klor-Con 10 10 Meq Cr-Tabs (Potassium Chloride) .Marland Kitchen.. 1 Once Daily 5)  Fluocinolone Acetonide 0.01 % Soln (Fluocinolone Acetonide) .... Use Once Daily On A Scalp  Rash 6)  Pennsaid 1.5 % Soln (Diclofenac Sodium) .... 2-3 Gtt On Skin Three Times A Day For Pain 7)  Cozaar 100 Mg Tabs (Losartan Potassium) .Marland Kitchen.. 1 By Mouth Qd 8)  Edarbi 80 Mg Tabs (Azilsartan Medoxomil) .Marland Kitchen.. 1 By Mouth Once Daily For Blood Pressure 9)  Furosemide 20 Mg Tabs (Furosemide) .... Take 1 Tab By Mouth Every Day 10)  Verapamil Hcl Cr 120 Mg Cr-Tabs (Verapamil Hcl) .Marland Kitchen.. 1 By Mouth Once Daily For Blood Pressure  Allergies (verified): 1)  ! Sulfa 2)  ! Hydrochlorothiazide 3)  Benazepril Hcl (Benazepril Hcl) 4)  Amlodipine Besylate (Amlodipine Besylate) 5)  Spironolactone (Spironolactone) 6)  *  Benicar 7)  Coreg (Carvedilol)  Past History:  Past Medical History: Last updated: 02/20/2009 GERD Hypertension Allergic rhinitis Psoriasis scalp  Social History: Last updated: 02/15/2008 Occupation: on disab. for incis. hernias Married Former Smoker Regular exercise-no  Physical Exam  General:  NAD Mouth:  Erythematous throat mucosa and intranasal erythema.  Lungs:  CTA Heart:  RRR Abdomen:  Bowel sounds positive,abdomen soft and non-tender without masses, organomegaly or hernias noted. Msk:  No deformity or scoliosis noted of thoracic or lumbar spine.   Neurologic:  No cranial nerve deficits noted. Station and gait are normal. Plantar reflexes are down-going bilaterally. DTRs are symmetrical throughout. Sensory, motor and coordinative functions appear intact. Skin:  Intact without suspicious lesions or rashes Psych:  Cognition and judgment appear intact. Alert and cooperative with normal attention span and concentration. No apparent delusions, illusions, hallucinations Not depressed   Impression & Recommendations:  Problem # 1:  HYPERTENSION (ICD-401.9) Assessment Improved  The following medications were removed from the medication list:    Cozaar 100 Mg Tabs (Losartan potassium) .Marland Kitchen... 1 by mouth qd    Verapamil Hcl Cr 120 Mg Cr-tabs (Verapamil hcl) .Marland Kitchen... 1 by mouth once daily for blood pressure Her updated medication list for this problem includes:    Edarbi 80 Mg Tabs (Azilsartan medoxomil) .Marland Kitchen... 1 by mouth once daily  for blood pressure    Furosemide 20 Mg Tabs (Furosemide) .Marland Kitchen... Take 1 tab by mouth every day not taking  Problem # 2:  MOOD DISORDER (ICD-296.90) Assessment: Deteriorated Try Fluoxetine  Complete Medication List: 1)  Vitamin D3 1000 Unit Tabs (Cholecalciferol) .Marland Kitchen.. 1 by mouth daily 2)  Loratadine 10 Mg Tabs (Loratadine) .... Once daily as needed allergies 3)  Fluticasone Propionate 50 Mcg/act Susp (Fluticasone propionate) .... 2 sprays each  nostril once daily 4)  Klor-con 10 10 Meq Cr-tabs (Potassium chloride) .Marland Kitchen.. 1 once daily 5)  Fluocinolone Acetonide 0.01 % Soln (Fluocinolone acetonide) .... Use once daily on a scalp  rash 6)  Pennsaid 1.5 % Soln (Diclofenac sodium) .... 2-3 gtt on skin three times a day for pain 7)  Edarbi 80 Mg Tabs (Azilsartan medoxomil) .Marland Kitchen.. 1 by mouth once daily for blood pressure 8)  Furosemide 20 Mg Tabs (Furosemide) .... Take 1 tab by mouth every day 9)  Fluoxetine Hcl 10 Mg Tabs (Fluoxetine hcl) .Marland Kitchen.. 1 by mouth once daily  Lipid Assessment/Plan:      Based on NCEP/ATP III, the patient's risk factor category is "0-1 risk factors".  The patient's lipid goals are as follows: Total cholesterol goal is 200; LDL cholesterol goal is 160; HDL cholesterol goal is 40; Triglyceride goal is 150.     Patient Instructions: 1)  Please schedule a follow-up appointment in 3 months. 2)  BMP prior to visit, ICD-9:401.1 Prescriptions: FLUOXETINE HCL 10 MG TABS (FLUOXETINE HCL) 1 by mouth once daily  #30 x 6   Entered and Authorized by:   Tresa Garter MD   Signed by:   Tresa Garter MD on 06/08/2010   Method used:   Print then Give to Patient   RxID:   [id number]    Orders Added: 1)  Est. Patient Level III [56213]

## 2010-08-10 NOTE — Progress Notes (Signed)
Summary: BP MED  Phone Note Call from Patient Call back at Home Phone [phone]   Summary of Call: pt called states new BP med is making her extremely tired so would like to know if you can pls put her on a different med....bc she can not function on current med Initial call taken by: Windell Norfolk,  July 02, 2008 4:03 PM  Follow-up for Phone Call        OK. Try  Benazepril Follow-up by: Tresa Garter MD,  July 02, 2008 5:08 PM  Additional Follow-up for Phone Call Additional follow up Details #1::        informed pt spoke with pt made aware rx was sent Additional Follow-up by: Shelbie Proctor,  July 03, 2008 8:49 AM   New Allergies: COREG (CARVEDILOL) New/Updated Medications: BENAZEPRIL HCL 5 MG TABS (BENAZEPRIL HCL) 1 tablet by mouth daily New Allergies: COREG (CARVEDILOL)  Prescriptions: BENAZEPRIL HCL 5 MG TABS (BENAZEPRIL HCL) 1 tablet by mouth daily  #30 x 12   Entered and Authorized by:   Tresa Garter MD   Signed by:   Shelbie Proctor on 07/03/2008   Method used:   Electronically to        [facility] [id number] (retail)       [address]       [place], [place]  [address]       Ph: [phone]       Fax: [phone]   RxID:   [id number]

## 2010-08-10 NOTE — Assessment & Plan Note (Signed)
Summary: 2wk f/u new bp med/SD   Vital Signs:  Patient profile:   55 year old female Height:      62 inches Weight:      154 pounds BMI:     28.27 Temp:     98.1 degrees F oral Pulse rate:   76 / minute Pulse rhythm:   regular Resp:     16 per minute BP sitting:   160 / 110  (left arm) Cuff size:   regular  Vitals Entered By: Lanier Prude, CMA(AAMA) (May 07, 2010 11:18 AM) CC: 2 wk f/u  c/o SOB since starting Cozaar Is Patient Diabetic? No Comments pt is not taking Loratadine, vit d, fluticasone, klor con, fluocinolone or pennsaid    CC:  2 wk f/u  c/o SOB since starting Cozaar.  History of Present Illness: F/u HTN - had side effects w/many meds C/o Losartan costing to much. She thinks it is giving her SOB  Current Medications (verified): 1)  Vitamin D3 1000 Unit  Tabs (Cholecalciferol) .Marland Kitchen.. 1 By Mouth Daily 2)  Loratadine 10 Mg  Tabs (Loratadine) .... Once Daily As Needed Allergies 3)  Fluticasone Propionate 50 Mcg/act  Susp (Fluticasone Propionate) .... 2 Sprays Each Nostril Once Daily 4)  Furosemide 20 Mg Tabs (Furosemide) .... Take 1 Tab By Mouth Every Day 5)  Klor-Con 10 10 Meq Cr-Tabs (Potassium Chloride) .Marland Kitchen.. 1 Once Daily 6)  Fluocinolone Acetonide 0.01 % Soln (Fluocinolone Acetonide) .... Use Once Daily On A Scalp  Rash 7)  Pennsaid 1.5 % Soln (Diclofenac Sodium) .... 2-3 Gtt On Skin Three Times A Day For Pain 8)  Cozaar 100 Mg Tabs (Losartan Potassium) .Marland Kitchen.. 1 By Mouth Qd  Allergies (verified): 1)  ! Sulfa 2)  ! Hydrochlorothiazide 3)  Benazepril Hcl (Benazepril Hcl) 4)  Amlodipine Besylate (Amlodipine Besylate) 5)  Spironolactone (Spironolactone) 6)  * Benicar 7)  Coreg (Carvedilol)  Past History:  Past Medical History: Last updated: 02/20/2009 GERD Hypertension Allergic rhinitis Psoriasis scalp  Social History: Last updated: 02/15/2008 Occupation: on disab. for incis. hernias Married Former Smoker Regular exercise-no  Review of  Systems       The patient complains of dyspnea on exertion.  The patient denies chest pain, prolonged cough, and abdominal pain.    Physical Exam  General:  NAD Nose:  Erythematous throat mucosa and intranasal erythema.  Mouth:  Erythematous throat mucosa and intranasal erythema.  Neck:  No deformities, masses, or tenderness noted. Lungs:  CTA Heart:  RRR Abdomen:  Bowel sounds positive,abdomen soft and non-tender without masses, organomegaly or hernias noted. Msk:  No deformity or scoliosis noted of thoracic or lumbar spine.   Neurologic:  No cranial nerve deficits noted. Station and gait are normal. Plantar reflexes are down-going bilaterally. DTRs are symmetrical throughout. Sensory, motor and coordinative functions appear intact. Skin:  Intact without suspicious lesions or rashes Psych:  Cognition and judgment appear intact. Alert and cooperative with normal attention span and concentration. No apparent delusions, illusions, hallucinations   Impression & Recommendations:  Problem # 1:  HYPERTENSION (ICD-401.9) Assessment Improved  See if Raynelle Chary is less expensive Her updated medication list for this problem includes:    Cozaar 100 Mg Tabs (Losartan potassium) .Marland Kitchen... 1 by mouth once daily $75    Edarbi 80 Mg Tabs (Azilsartan medoxomil) .Marland Kitchen... 1 by mouth once daily for blood pressure    Furosemide 20 Mg Tabs (Furosemide) .Marland Kitchen... Take 1 tab by mouth every day BP is better at home BP today:  160/110 Prior BP: 170/110 (04/02/2010)  Labs Reviewed: K+: 4.1 (03/31/2010) Creat: : 0.9 (03/31/2010)     Problem # 2:  DYSPNEA (ICD-786.05) - mild - poss due to Losartan Assessment: New Try 1/2 two times a day See "Patient Instructions".   Problem # 3:  OBESITY (ICD-278.00) Assessment: Unchanged Loose wt  Problem # 4:  ALLERGIC RHINITIS (ICD-477.9) Assessment: Unchanged  Her updated medication list for this problem includes:    Loratadine 10 Mg Tabs (Loratadine) ..... Once daily as  needed allergies    Fluticasone Propionate 50 Mcg/act Susp (Fluticasone propionate) .Marland Kitchen... 2 sprays each nostril once daily  Complete Medication List: 1)  Vitamin D3 1000 Unit Tabs (Cholecalciferol) .Marland Kitchen.. 1 by mouth daily 2)  Loratadine 10 Mg Tabs (Loratadine) .... Once daily as needed allergies 3)  Fluticasone Propionate 50 Mcg/act Susp (Fluticasone propionate) .... 2 sprays each nostril once daily 4)  Klor-con 10 10 Meq Cr-tabs (Potassium chloride) .Marland Kitchen.. 1 once daily 5)  Fluocinolone Acetonide 0.01 % Soln (Fluocinolone acetonide) .... Use once daily on a scalp  rash 6)  Pennsaid 1.5 % Soln (Diclofenac sodium) .... 2-3 gtt on skin three times a day for pain 7)  Cozaar 100 Mg Tabs (Losartan potassium) .Marland Kitchen.. 1 by mouth qd 8)  Edarbi 80 Mg Tabs (Azilsartan medoxomil) .Marland Kitchen.. 1 by mouth once daily for blood pressure 9)  Furosemide 20 Mg Tabs (Furosemide) .... Take 1 tab by mouth every day 10)  Verapamil Hcl Cr 120 Mg Cr-tabs (Verapamil hcl) .Marland Kitchen.. 1 by mouth once daily for blood pressure  Patient Instructions: 1)  See if you can get Edarbi for $25 a month 2)  Take either Edarbi or Losartan, not two of them 3)  Try to add Verapamil if BP is up >140/90 4)  Please schedule a follow-up appointment in 1 month. Prescriptions: VERAPAMIL HCL CR 120 MG CR-TABS (VERAPAMIL HCL) 1 by mouth once daily for blood pressure  #30 x 11   Entered and Authorized by:   Tresa Garter MD   Signed by:   Tresa Garter MD on 05/07/2010   Method used:   Print then Give to Patient   RxID:   [id number] EDARBI 80 MG TABS (AZILSARTAN MEDOXOMIL) 1 by mouth once daily for blood pressure  #30 x 11   Entered and Authorized by:   Tresa Garter MD   Signed by:   Tresa Garter MD on 05/07/2010   Method used:   Print then Give to Patient   RxID:   [id number]    Orders Added: 1)  Est. Patient Level IV [95284]

## 2010-08-10 NOTE — Assessment & Plan Note (Signed)
Summary: 6 MO ROV /NWS $50   Vital Signs:  Patient profile:   55 year old female Weight:      150 pounds Temp:     97.1 degrees F oral Pulse rate:   103 / minute BP sitting:   124 / 94  (left arm)  Vitals Entered By: Tora Perches (February 20, 2009 2:36 PM) CC: f/u Is Patient Diabetic? No   CC:  f/u.  History of Present Illness: The patient presents for a follow up of hypertension, moodineess, allergies   Current Medications (verified): 1)  Vitamin D3 1000 Unit  Tabs (Cholecalciferol) .Marland Kitchen.. 1 By Mouth Daily 2)  Loratadine 10 Mg  Tabs (Loratadine) .... Once Daily As Needed Allergies 3)  Fluticasone Propionate 50 Mcg/act  Susp (Fluticasone Propionate) .... 2 Sprays Each Nostril Once Daily 4)  Furosemide 20 Mg Tabs (Furosemide) .... Take 1 Tab By Mouth Every Day 5)  Klor-Con 10 10 Meq Cr-Tabs (Potassium Chloride) .Marland Kitchen.. 1 Once Daily  Allergies: 1)  ! Sulfa 2)  ! Hydrochlorothiazide 3)  Benazepril Hcl (Benazepril Hcl) 4)  Amlodipine Besylate (Amlodipine Besylate) 5)  Spironolactone (Spironolactone) 6)  Coreg (Carvedilol)  Past History:  Past Surgical History: Last updated: 02/19/2007 Cholecystectomy ERCP Hysterectomy  Family History: Last updated: 02/15/2008 Family History Hypertension  Social History: Last updated: 02/15/2008 Occupation: on disab. for incis. hernias Married Former Smoker Regular exercise-no  Past Medical History: GERD Hypertension Allergic rhinitis Psoriasis scalp  Physical Exam  General:  NAD Nose:  Erythematous throat mucosa and intranasal erythema.  Mouth:  Erythematous throat mucosa and intranasal erythema.  Neck:  No deformities, masses, or tenderness noted. Lungs:  CTA Heart:  RRR Abdomen:  Bowel sounds positive,abdomen soft and non-tender without masses, organomegaly or hernias noted. Msk:  No deformity or scoliosis noted of thoracic or lumbar spine.   Neurologic:  No cranial nerve deficits noted. Station and gait are normal.  Plantar reflexes are down-going bilaterally. DTRs are symmetrical throughout. Sensory, motor and coordinative functions appear intact. Skin:  Intact without suspicious lesions or rashes Psych:  Cognition and judgment appear intact. Alert and cooperative with normal attention span and concentration. No apparent delusions, illusions, hallucinations   Impression & Recommendations:  Problem # 1:  HYPERTENSION (ICD-401.9) Assessment Improved The labs were reviewed with the patient - nl Her updated medication list for this problem includes:    Furosemide 20 Mg Tabs (Furosemide) .Marland Kitchen... Take 1 tab by mouth every day  BP today: 124/94 Prior BP: 170/102 (10/24/2008)  Labs Reviewed: K+: 3.8 (02/13/2009) Creat: : 0.9 (02/13/2009)     Problem # 2:  OBESITY (ICD-278.00) Assessment: Unchanged The office visit took longer than 20 min with patient councelling for more than 50% of the 20 min    Problem # 3:  ALLERGIC RHINITIS (ICD-477.9) Assessment: Comment Only  Her updated medication list for this problem includes:    Loratadine 10 Mg Tabs (Loratadine) ..... Once daily as needed allergies    Fluticasone Propionate 50 Mcg/act Susp (Fluticasone propionate) .Marland Kitchen... 2 sprays each nostril once daily  Problem # 4:  MOOD DISORDER (ICD-296.90) Assessment: Improved  Complete Medication List: 1)  Vitamin D3 1000 Unit Tabs (Cholecalciferol) .Marland Kitchen.. 1 by mouth daily 2)  Loratadine 10 Mg Tabs (Loratadine) .... Once daily as needed allergies 3)  Fluticasone Propionate 50 Mcg/act Susp (Fluticasone propionate) .... 2 sprays each nostril once daily 4)  Furosemide 20 Mg Tabs (Furosemide) .... Take 1 tab by mouth every day 5)  Klor-con 10 10 Meq Cr-tabs (  Potassium chloride) .Marland Kitchen.. 1 once daily 6)  Fluocinolone Acetonide 0.01 % Soln (Fluocinolone acetonide) .... Use once daily on a scalp  rash  Patient Instructions: 1)  Start taking a yoga class 2)  Try to eat more raw plant food, fresh and dry fruit, raw almonds,  leafy vegetables, whole foods and less red meat, less animal fat. Poultry and fish is better for you than pork and beef. Avoid processed foods (canned soups, hot dogs, sausage, bacon , frozen dinners). Avoid corn syrup, high fructose syrup or aspartam and Splenda  containing drinks. Honey, Agave and Stevia are better sweeteners. Make your own  dressing with olive oil, wine vinegar, lemon juce, garlic etc. for your salads. 3)  Please schedule a follow-up appointment in 6 months. 4)  BMP prior to visit, ICD-9: 401.1 Prescriptions: FLUOCINOLONE ACETONIDE 0.01 % SOLN (FLUOCINOLONE ACETONIDE) use once daily on a scalp  rash  #120 ml x 3   Entered and Authorized by:   Tresa Garter MD   Signed by:   Tresa Garter MD on 02/20/2009   Method used:   Electronically to        [facility] [id number] (retail)       [address]       [place], [place]  [address]       Ph: [phone]       Fax: [phone]   RxID:   [id number]

## 2010-08-10 NOTE — Progress Notes (Signed)
Summary: pnuemonia ???  Phone Note Call from Patient Call back at Home Phone [phone]   Complaint: Abdominal Pain Summary of Call: pt called and stated she had pneumonia a couple weeks ago and the same symptoms are returning again wants to know can antibiotics be called in to pharm...Marland KitchenMarland KitchenMarland Kitchen so she does not have to ride to [facility] Initial call taken by: Windell Norfolk,  July 09, 2008 9:59 AM  Follow-up for Phone Call        called pt back and she stated she wants anything except doxycycline  because that makes her extremely  Follow-up by: Windell Norfolk,  July 09, 2008 9:59 AM  Additional Follow-up for Phone Call Additional follow up Details #1::        If the patient believes she is developing pneumonia again she needs to be seen here or at a location that she can get to. Additional Follow-up by: Jacques Navy MD,  July 09, 2008 10:09 AM    Additional Follow-up for Phone Call Additional follow up Details #2::    called pt and made her aware of physcian instructions.....she just said ok and wanted to get off of phone  Follow-up by: Windell Norfolk,  July 09, 2008 11:37 AM

## 2010-08-10 NOTE — Progress Notes (Signed)
Summary: Danielle [name]  Phone Note Call from Patient   Caller: [name] Summary of Call: PT STATES rx WAS GIVEN TO TAKE TO [facility]   med: Amolodipine---- BUT STATES MED IS TO EXSPENSIVE, THEREFORE SHE IS ASKING YOU TO CALL SOMETHING IN THAT IS CHEAPER, TO [facility] [place]. Initial call taken by: Tora Perches,  June 18, 2008 11:05 AM  Follow-up for Phone Call        Try Coreg Follow-up by: Tresa Garter MD,  June 18, 2008 11:26 AM    New/Updated Medications: COREG 6.25 MG TABS (CARVEDILOL) 1 by mouth bid   Prescriptions: COREG 6.25 MG TABS (CARVEDILOL) 1 by mouth bid  #60 x 12   Entered by:   Lamar Sprinkles   Authorized by:   Tresa Garter MD   Signed by:   Lamar Sprinkles on 06/19/2008   Method used:   Electronically to        [facility] [id number] (retail)       [address] [place], [place]  [address]       Ph: [phone]       Fax: [phone]   RxID:   [id number]

## 2010-08-10 NOTE — Assessment & Plan Note (Signed)
Summary: 6 mo rov /nws  #   Vital Signs:  Patient profile:   55 year old female Height:      62 inches Weight:      154 pounds BMI:     28.27 Temp:     97.3 degrees F oral Pulse rate:   72 / minute Pulse rhythm:   regular Resp:     16 per minute BP sitting:   170 / 110  (left arm) Cuff size:   regular  Vitals Entered By: Lanier Prude, CMA(AAMA) (April 02, 2010 11:12 AM) CC: 6 mo f/u  c/o Rt heel pain Is Patient Diabetic? No Comments pt is not taking Vit D, Loratadine, Fluticasone, Klor Con, Flucinolone or Benicar   CC:  6 mo f/u  c/o Rt heel pain.  History of Present Illness: C/o R heel pain x months F/u HTN  - she quit Benicar F/u allergies  Current Medications (verified): 1)  Vitamin D3 1000 Unit  Tabs (Cholecalciferol) .Marland Kitchen.. 1 By Mouth Daily 2)  Loratadine 10 Mg  Tabs (Loratadine) .... Once Daily As Needed Allergies 3)  Fluticasone Propionate 50 Mcg/act  Susp (Fluticasone Propionate) .... 2 Sprays Each Nostril Once Daily 4)  Furosemide 20 Mg Tabs (Furosemide) .... Take 1 Tab By Mouth Every Day 5)  Klor-Con 10 10 Meq Cr-Tabs (Potassium Chloride) .Marland Kitchen.. 1 Once Daily 6)  Fluocinolone Acetonide 0.01 % Soln (Fluocinolone Acetonide) .... Use Once Daily On A Scalp  Rash 7)  Benicar 20 Mg Tabs (Olmesartan Medoxomil) .Marland Kitchen.. 1 By Mouth Qd  Allergies (verified): 1)  ! Sulfa 2)  ! Hydrochlorothiazide 3)  Benazepril Hcl (Benazepril Hcl) 4)  Amlodipine Besylate (Amlodipine Besylate) 5)  Spironolactone (Spironolactone) 6)  * Benicar 7)  Coreg (Carvedilol)  Past History:  Past Medical History: Last updated: 02/20/2009 GERD Hypertension Allergic rhinitis Psoriasis scalp  Social History: Last updated: 02/15/2008 Occupation: on disab. for incis. hernias Married Former Smoker Regular exercise-no  Family History: Reviewed history from 02/15/2008 and no changes required. Family History Hypertension  Social History: Reviewed history from 02/15/2008 and no changes  required. Occupation: on disab. for incis. hernias Married Former Smoker Regular exercise-no  Physical Exam  General:  NAD Mouth:  Erythematous throat mucosa and intranasal erythema.  Neck:  No deformities, masses, or tenderness noted. Lungs:  CTA Heart:  RRR Abdomen:  Bowel sounds positive,abdomen soft and non-tender without masses, organomegaly or hernias noted. Msk:  No deformity or scoliosis noted of thoracic or lumbar spine.   Neurologic:  No cranial nerve deficits noted. Station and gait are normal. Plantar reflexes are down-going bilaterally. DTRs are symmetrical throughout. Sensory, motor and coordinative functions appear intact. Skin:  Intact without suspicious lesions or rashes Psych:  Cognition and judgment appear intact. Alert and cooperative with normal attention span and concentration. No apparent delusions, illusions, hallucinations   Impression & Recommendations:  Problem # 1:  FOOT PAIN (ICD-729.5) R plantar fasciitis Assessment New  Problem # 2:  HYPERTENSION (ICD-401.9) Assessment: Deteriorated Not taking meds. Risks of noncompliance with treatment discussed. Compliance encouraged.  The following medications were removed from the medication list:    Benicar 20 Mg Tabs (Olmesartan medoxomil) .Marland Kitchen... 1 by mouth qd Her updated medication list for this problem includes:    Furosemide 20 Mg Tabs (Furosemide) .Marland Kitchen... Take 1 tab by mouth every day    Diovan 160 Mg Tabs (Valsartan) .Marland Kitchen... 1 by mouth once daily for blood pressure  Problem # 3:  ALLERGIC RHINITIS (ICD-477.9) Assessment: Unchanged  Her  updated medication list for this problem includes:    Loratadine 10 Mg Tabs (Loratadine) ..... Once daily as needed allergies    Fluticasone Propionate 50 Mcg/act Susp (Fluticasone propionate) .Marland Kitchen... 2 sprays each nostril once daily  Problem # 4:  GERD (ICD-530.81) Assessment: Improved  Complete Medication List: 1)  Vitamin D3 1000 Unit Tabs (Cholecalciferol) .Marland Kitchen.. 1 by  mouth daily 2)  Loratadine 10 Mg Tabs (Loratadine) .... Once daily as needed allergies 3)  Fluticasone Propionate 50 Mcg/act Susp (Fluticasone propionate) .... 2 sprays each nostril once daily 4)  Furosemide 20 Mg Tabs (Furosemide) .... Take 1 tab by mouth every day 5)  Klor-con 10 10 Meq Cr-tabs (Potassium chloride) .Marland Kitchen.. 1 once daily 6)  Fluocinolone Acetonide 0.01 % Soln (Fluocinolone acetonide) .... Use once daily on a scalp  rash 7)  Diovan 160 Mg Tabs (Valsartan) .Marland Kitchen.. 1 by mouth once daily for blood pressure 8)  Pennsaid 1.5 % Soln (Diclofenac sodium) .... 2-3 gtt on skin three times a day for pain  Other Orders: Flu Vaccine 64yrs + MEDICARE PATIENTS (Z6109) Administration Flu vaccine - MCR (U0454)  Patient Instructions: 1)  Please schedule a follow-up appointment in 1 month. Prescriptions: PENNSAID 1.5 % SOLN (DICLOFENAC SODIUM) 2-3 gtt on skin three times a day for pain  #1 x 3   Entered and Authorized by:   Tresa Garter MD   Signed by:   Tresa Garter MD on 04/02/2010   Method used:   Print then Give to Patient   RxID:   [id number] DIOVAN 160 MG TABS (VALSARTAN) 1 by mouth once daily for blood pressure  #30 x 11   Entered and Authorized by:   Tresa Garter MD   Signed by:   Tresa Garter MD on 04/02/2010   Method used:   Print then Give to Patient   RxID:   [id number]    .[email or web address]   Flu Vaccine Consent Questions     Do you have a history of severe allergic reactions to this vaccine? no    Any prior history of allergic reactions to egg and/or gelatin? no    Do you have a sensitivity to the preservative Thimersol? no    Do you have a past history of Guillan-Barre Syndrome? no    Do you currently have an acute febrile illness? no    Have you ever had a severe reaction to latex? no    Vaccine information given and explained to patient? yes    Are you currently pregnant? no    Lot Number:[id number]   Exp Date:01/08/2011   Site Given  Left  Deltoid IM Lanier Prude, [facility])  April 02, 2010 2:46 PM

## 2010-08-10 NOTE — Progress Notes (Signed)
Summary: BP MED?   Phone Note Call from Patient Call back at [facility] Phone [phone]   Summary of Call: Pt was given generic rx, cozaar. She has been on med x 9 days. Bp yesterday and then this am was 136/96 and 157/111. Should she keep taking med? Change dose? or change back to benicar?  Initial call taken by: Lamar Sprinkles, CMA,  April 12, 2010 10:59 AM  Follow-up for Phone Call        keep taking Losartan Sch return office visit in 2 wks w/BP record Follow-up by: Tresa Garter MD,  April 12, 2010 1:24 PM  Additional Follow-up for Phone Call Additional follow up Details #1::        Pt informed, scheduled for f/u  Additional Follow-up by: Lamar Sprinkles, CMA,  April 12, 2010 1:35 PM

## 2010-08-10 NOTE — Assessment & Plan Note (Signed)
Summary: cough x 3 wks SD   Vital Signs:  Patient Profile:   55 Years Old Female Weight:      150 pounds Temp:     97 degrees F oral Pulse rate:   76 / minute BP sitting:   170 / 110  (left arm)  Vitals Entered By: Tora Perches (June 13, 2008 11:24 AM)                 Chief Complaint:  Multiple medical problems or concerns  (Pt states she is not taking any  of her meds and never got them..  History of Present Illness: The patient presents with complaints of sore throat, fever, cough, sinus congestion and drainge of 3 wks duration. Not better with OTC meds. Chest hurts with coughing. Can't sleep due to cough.  The mucus is colored.     Current Allergies (reviewed today): ! SULFA ! HYDROCHLOROTHIAZIDE  Past Medical History:    Reviewed history from 02/15/2008 and no changes required:       GERD       Hypertension   Family History:    Reviewed history from 02/15/2008 and no changes required:       Family History Hypertension  Social History:    Reviewed history from 02/15/2008 and no changes required:       Occupation: on disab. for incis. hernias       Married       Former Smoker       Regular exercise-no     Physical Exam  General:     NAD Eyes:     No corneal or conjunctival inflammation noted. EOMI. Perrla. Funduscopic exam benign, without hemorrhages, exudates or papilledema. Vision grossly normal. Ears:     External ear exam shows no significant lesions or deformities.  Otoscopic examination reveals clear canals, tympanic membranes are intact bilaterally without bulging, retraction, inflammation or discharge. Hearing is grossly normal bilaterally. Mouth:     Erythematous throat mucosa and intranasal erythema.  Neck:     No deformities, masses, or tenderness noted. Lungs:     L base rhonchi Heart:     RRR Abdomen:     Bowel sounds positive,abdomen soft and non-tender without masses, organomegaly or hernias noted.    Impression &  Recommendations:  Problem # 1:  COUGH (ICD-786.2) Assessment: New Prom/Cod Orders: T-2 View CXR, Same Day (71020.5TC)   Problem # 2:  BRONCHITIS, ACUTE (ICD-466.0) - poss LLL pneumonia Assessment: New  Her updated medication list for this problem includes:    Doxycycline Hyclate 100 Mg Caps (Doxycycline hyclate) .Marland Kitchen... Take 1 tab twice a day    Promethazine-codeine 6.25-10 Mg/42ml Syrp (Promethazine-codeine) .Marland Kitchen... Take 5-68mls by mouth every 4-6 hours as needed for cough   Problem # 3:  HYPERTENSION (ICD-401.9) Assessment: Unchanged  Her updated medication list for this problem includes:    Amlodipine Besylate 5 Mg Tabs (Amlodipine besylate) .Marland Kitchen... 1 by mouth once daily restart Risks of noncompliance with treatment discussed. Compliance encouraged.   Her updated medication list for this problem includes:    Amlodipine Besylate 5 Mg Tabs (Amlodipine besylate) .Marland Kitchen... 1 by mouth once daily   Complete Medication List: 1)  Amlodipine Besylate 5 Mg Tabs (Amlodipine besylate) .Marland Kitchen.. 1 by mouth once daily 2)  Zolpidem Tartrate 10 Mg Tabs (Zolpidem tartrate) .... 1/2 or 1 by mouth at hs prn 3)  Vitamin D3 1000 Unit Tabs (Cholecalciferol) .Marland Kitchen.. 1 by mouth daily 4)  Ranitidine Hcl 150 Mg  Caps (Ranitidine hcl) .... Two times a day prn 5)  Doxycycline Hyclate 100 Mg Caps (Doxycycline hyclate) .... Take 1 tab twice a day 6)  Promethazine-codeine 6.25-10 Mg/82ml Syrp (Promethazine-codeine) .... Take 5-82mls by mouth every 4-6 hours as needed for cough   Patient Instructions: 1)  Call if you are not better in a reasonable ammount of time or if worse. Go to ER if feeling really bad! 2)  Please schedule a follow-up appointment in 6 months.   Prescriptions: AMLODIPINE BESYLATE 5 MG  TABS (AMLODIPINE BESYLATE) 1 by mouth once daily  #30 x 12   Entered and Authorized by:   Tresa Garter MD   Signed by:   Tresa Garter MD on 06/13/2008   Method used:   Print then Give to Patient   RxID:    [id number] PROMETHAZINE-CODEINE 6.25-10 MG/5ML  SYRP (PROMETHAZINE-CODEINE) Take 5-24mls by mouth every 4-6 hours as needed for cough  #339mls x 0   Entered and Authorized by:   Tresa Garter MD   Signed by:   Tresa Garter MD on 06/13/2008   Method used:   Print then Give to Patient   RxID:   [id number] DOXYCYCLINE HYCLATE 100 MG CAPS (DOXYCYCLINE HYCLATE) Take 1 tab twice a day  #20 x 0   Entered and Authorized by:   Tresa Garter MD   Signed by:   Tresa Garter MD on 06/13/2008   Method used:   Print then Give to Patient   RxID:   [id number]  ]

## 2010-08-10 NOTE — Assessment & Plan Note (Signed)
Summary: FU ON MEDS/BP/ NWS $50   Vital Signs:  Patient Profile:   55 Years Old Female Weight:      150 pounds Temp:     98.6 degrees F oral Pulse rate:   101 / minute BP sitting:   129 / 94  (left arm)  Vitals Entered By: Tora Perches (February 15, 2008 2:50 PM)                 Chief Complaint:  Multiple medical problems or concerns.  History of Present Illness: The patient presents for a follow up of hypertension, GERD, insomnia. C/o neck pain     Current Allergies (reviewed today): ! SULFA ! HYDROCHLOROTHIAZIDE  Past Medical History:    Reviewed history from 02/19/2007 and no changes required:       GERD       Hypertension   Family History:    Family History Hypertension  Social History:    Occupation: on disab. for incis. hernias    Married    Former Smoker    Regular exercise-no   Risk Factors:  Tobacco use:  quit Exercise:  no   Review of Systems       The patient complains of weight gain.         The rest of the 18 point ROS is negative except for noted above.    Physical Exam  General:     overweight-appearing.   Head:     Normocephalic and atraumatic without obvious abnormalities. No apparent alopecia or balding. Ears:     External ear exam shows no significant lesions or deformities.  Otoscopic examination reveals clear canals, tympanic membranes are intact bilaterally without bulging, retraction, inflammation or discharge. Hearing is grossly normal bilaterally. Nose:     External nasal examination shows no deformity or inflammation. Nasal mucosa are pink and moist without lesions or exudates. Mouth:     Oral mucosa and oropharynx without lesions or exudates.  Teeth in good repair. Neck:     No deformities, masses, or tenderness noted. Lungs:     Normal respiratory effort, chest expands symmetrically. Lungs are clear to auscultation, no crackles or wheezes. Heart:     Normal rate and regular rhythm. S1 and S2 normal without gallop,  murmur, click, rub or other extra sounds. Abdomen:     Bowel sounds positive,abdomen soft and non-tender without masses, organomegaly or hernias noted. Msk:     No deformity or scoliosis noted of thoracic or lumbar spine.   Pulses:     R and L carotid,radial,femoral,dorsalis pedis and posterior tibial pulses are full and equal bilaterally Extremities:     No clubbing, cyanosis, edema, or deformity noted with normal full range of motion of all joints.   Neurologic:     No cranial nerve deficits noted. Station and gait are normal. Plantar reflexes are down-going bilaterally. DTRs are symmetrical throughout. Sensory, motor and coordinative functions appear intact. Skin:     Intact without suspicious lesions or rashes Psych:     Cognition and judgment appear intact. Alert and cooperative with normal attention span and concentration. No apparent delusions, illusions, hallucinations    Impression & Recommendations:  Problem # 1:  HYPERTENSION (ICD-401.9) Assessment: Improved  The following medications were removed from the medication list:    Spironolactone 50 Mg Tabs (Spironolactone) ..... Once daily  Her updated medication list for this problem includes:    Amlodipine Besylate 5 Mg Tabs (Amlodipine besylate) .Marland Kitchen... 1 by mouth once daily  Orders: TLB-BMP (Basic Metabolic Panel-BMET) (80048-METABOL) TLB-B12, Serum-Total ONLY (16109-U04) TLB-CBC Platelet - w/Differential (85025-CBCD) TLB-TSH (Thyroid Stimulating Hormone) (84443-TSH) T-Vitamin D (25-Hydroxy) (54098-11914) TLB-Udip ONLY (81003-UDIP) TLB-Hepatic/Liver Function Pnl (80076-HEPATIC)   Problem # 2:  GERD (ICD-530.81) Assessment: Improved  Her updated medication list for this problem includes:    Ranitidine Hcl 150 Mg Caps (Ranitidine hcl) .Marland Kitchen..Marland Kitchen Two times a day prn   Problem # 3:  INSOMNIA, PERSISTENT (ICD-307.42) Assessment: New Zolpidem  Problem # 4:  HERNIA, 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 1 by mouth once daily 2)  Zolpidem Tartrate 10 Mg Tabs (Zolpidem tartrate) .... 1/2 or 1 by mouth at hs prn 3)  Vitamin D3 1000 Unit Tabs (Cholecalciferol) .Marland Kitchen.. 1 by mouth daily 4)  Ranitidine Hcl 150 Mg Caps (Ranitidine hcl) .... Two times a day prn   Patient Instructions: 1)  Please schedule a follow-up appointment in 6 months. 2)  Melatonin or Valerian root for sleep 3)  Contour pillow   Prescriptions: RANITIDINE HCL 150 MG CAPS (RANITIDINE HCL) two times a day prn  #60 x 6   Entered and Authorized by:   Tresa Garter MD   Signed by:   Tresa Garter MD on 02/15/2008   Method used:   Print then Give to Patient   RxID:   [id number] ZOLPIDEM TARTRATE 10 MG  TABS (ZOLPIDEM TARTRATE) 1/2 or 1 by mouth at hs prn  #30 x 6   Entered and Authorized by:   Tresa Garter MD   Signed by:   Tresa Garter MD on 02/15/2008   Method used:   Print then Give to Patient   RxID:   [id number] AMLODIPINE BESYLATE 5 MG  TABS (AMLODIPINE BESYLATE) 1 by mouth once daily  #30 x 12   Entered and Authorized by:   Tresa Garter MD   Signed by:   Tresa Garter MD on 02/15/2008   Method used:   Print then Give to Patient   RxID:   [id number]  ]

## 2010-08-10 NOTE — Assessment & Plan Note (Signed)
Summary: 6 mo rov Natale Milch $50  RS'D PER PT /NWS   Vital Signs:  Patient profile:   55 year old female Weight:      153 pounds BMI:     28.09 Temp:     98.4 degrees F oral Pulse rate:   78 / minute BP sitting:   134 / 100  (left arm)  Vitals Entered By: Tora Perches (September 10, 2009 9:14 AM) CC: f/u Is Patient Diabetic? No   CC:  f/u.  History of Present Illness: F/u HTN, GERD, allergies  Preventive Screening-Counseling & Management  Alcohol-Tobacco     Smoking Status: quit  Current Medications (verified): 1)  Vitamin D3 1000 Unit  Tabs (Cholecalciferol) .Marland Kitchen.. 1 By Mouth Daily 2)  Loratadine 10 Mg  Tabs (Loratadine) .... Once Daily As Needed Allergies 3)  Fluticasone Propionate 50 Mcg/act  Susp (Fluticasone Propionate) .... 2 Sprays Each Nostril Once Daily 4)  Furosemide 20 Mg Tabs (Furosemide) .... Take 1 Tab By Mouth Every Day 5)  Klor-Con 10 10 Meq Cr-Tabs (Potassium Chloride) .Marland Kitchen.. 1 Once Daily 6)  Fluocinolone Acetonide 0.01 % Soln (Fluocinolone Acetonide) .... Use Once Daily On A Scalp  Rash  Allergies: 1)  ! Sulfa 2)  ! Hydrochlorothiazide 3)  Benazepril Hcl (Benazepril Hcl) 4)  Amlodipine Besylate (Amlodipine Besylate) 5)  Spironolactone (Spironolactone) 6)  Coreg (Carvedilol)  Past History:  Past Medical History: Last updated: 02/20/2009 GERD Hypertension Allergic rhinitis Psoriasis scalp  Social History: Last updated: 02/15/2008 Occupation: on disab. for incis. hernias Married Former Smoker Regular exercise-no  Review of Systems  The patient denies chest pain, syncope, and dyspnea on exertion.    Physical Exam  General:  NAD Nose:  Erythematous throat mucosa and intranasal erythema.  Mouth:  Erythematous throat mucosa and intranasal erythema.  Lungs:  CTA Heart:  RRR Abdomen:  Bowel sounds positive,abdomen soft and non-tender without masses, organomegaly or hernias noted. Msk:  No deformity or scoliosis noted of thoracic or lumbar spine.     Neurologic:  No cranial nerve deficits noted. Station and gait are normal. Plantar reflexes are down-going bilaterally. DTRs are symmetrical throughout. Sensory, motor and coordinative functions appear intact. Skin:  Intact without suspicious lesions or rashes Psych:  Cognition and judgment appear intact. Alert and cooperative with normal attention span and concentration. No apparent delusions, illusions, hallucinations   Impression & Recommendations:  Problem # 1:  HYPERTENSION (ICD-401.9) Assessment Deteriorated Not willing to try new meds Her updated medication list for this problem includes:    Furosemide 20 Mg Tabs (Furosemide) .Marland Kitchen... Take 1 tab by mouth every day    Benicar 20 Mg Tabs (Olmesartan medoxomil) .Marland Kitchen... 1 by mouth qd  Problem # 2:  OBESITY (ICD-278.00) Started Wt watchers  Problem # 3:  ALLERGIC RHINITIS (ICD-477.9) Assessment: Unchanged  Her updated medication list for this problem includes:    Loratadine 10 Mg Tabs (Loratadine) ..... Once daily as needed allergies    Fluticasone Propionate 50 Mcg/act Susp (Fluticasone propionate) .Marland Kitchen... 2 sprays each nostril once daily  Problem # 4:  GERD (ICD-530.81) Assessment: Unchanged  Complete Medication List: 1)  Vitamin D3 1000 Unit Tabs (Cholecalciferol) .Marland Kitchen.. 1 by mouth daily 2)  Loratadine 10 Mg Tabs (Loratadine) .... Once daily as needed allergies 3)  Fluticasone Propionate 50 Mcg/act Susp (Fluticasone propionate) .... 2 sprays each nostril once daily 4)  Furosemide 20 Mg Tabs (Furosemide) .... Take 1 tab by mouth every day 5)  Klor-con 10 10 Meq Cr-tabs (Potassium chloride) .Marland KitchenMarland KitchenMarland Kitchen  1 once daily 6)  Fluocinolone Acetonide 0.01 % Soln (Fluocinolone acetonide) .... Use once daily on a scalp  rash 7)  Benicar 20 Mg Tabs (Olmesartan medoxomil) .Marland Kitchen.. 1 by mouth qd  Other Orders: Tdap => 74yrs IM (29562) Admin 1st Vaccine (13086) Admin 1st Vaccine [facility]) [phone])  Patient Instructions: 1)  Please schedule a follow-up  appointment in 6 months. 2)  BMP prior to visit, ICD-9: 3)  Hepatic Panel prior to visit, ICD-9: 4)  TSH prior to visit, ICD-9: 5)  CBC w/ Diff prior to visit, ICD-9:401.1 995.20 6)  Urine-dip prior to visit, ICD-9:  Contraindications/Deferment of Procedures/Staging:    Test/Procedure: FLU VAX    Reason for deferment: patient declined  Prescriptions: BENICAR 20 MG TABS (OLMESARTAN MEDOXOMIL) 1 by mouth qd  #90 x 3   Entered and Authorized by:   Tresa Garter MD   Signed by:   Tresa Garter MD on 09/10/2009   Method used:   Print then Give to Patient   RxID:   [id number]    Tetanus/Td Vaccine    Vaccine Type: Tdap    Site: left deltoid    Mfr: GlaxoSmithKline    Dose: 0.5 ml    Route: IM    Given by: Tora Perches    Exp. Date: 10/03/2011    Lot #: [id number]    VIS given: 05/29/07 version given September 10, 2009.

## 2010-08-10 NOTE — Assessment & Plan Note (Signed)
Summary: PER PHONE NOTE SCHED-SINUS PROBLEM-PHONE-D/T-$50-STC   Vital Signs:  Patient profile:   55 year old female Height:      62 inches Weight:      150 pounds BMI:     27.53 Temp:     97.2 degrees F oral Pulse rate:   85 / minute BP sitting:   170 / 102  (right arm)  Vitals Entered By: Tora Perches (October 24, 2008 10:27 AM) CC: sinus problems Is Patient Diabetic? No   CC:  sinus problems.  History of Present Illness: The patient presents with complaints of sore throat, fever, cough, sinus congestion and drainge of several days duration. Not better with OTC meds.t.  The mucus is colored. F/u HTN  Current Medications (verified): 1)  Vitamin D3 1000 Unit  Tabs (Cholecalciferol) .Marland Kitchen.. 1 By Mouth Daily 2)  Benazepril Hcl 5 Mg Tabs (Benazepril Hcl) .Marland Kitchen.. 1 Tablet By Mouth Daily  Allergies: 1)  ! Sulfa 2)  ! Hydrochlorothiazide 3)  Benazepril Hcl (Benazepril Hcl) 4)  Coreg (Carvedilol)  Past History:  Past Medical History:    GERD    Hypertension    Allergic rhinitis PMH-FH-SH reviewed-no changes except otherwise noted  Family History:    Reviewed history from 02/15/2008 and no changes required:       Family History Hypertension  Social History:    Reviewed history from 02/15/2008 and no changes required:       Occupation: on disab. for incis. hernias       Married       Former Smoker       Regular exercise-no  Review of Systems       The patient complains of prolonged cough.    Physical Exam  General:  NAD Head:  Normocephalic and atraumatic without obvious abnormalities. No apparent alopecia or balding. Ears:  External ear exam shows no significant lesions or deformities.  Otoscopic examination reveals clear canals, tympanic membranes are intact bilaterally without bulging, retraction, inflammation or discharge. Hearing is grossly normal bilaterally. Nose:  Erythematous throat mucosa and intranasal erythema.  Lungs:  CTA Heart:  RRR Abdomen:  Bowel  sounds positive,abdomen soft and non-tender without masses, organomegaly or hernias noted.   Impression & Recommendations:  Problem # 1:  COUGH (ICD-786.2) Assessment Deteriorated D/c ACE  Problem # 2:  ALLERGIC RHINITIS (ICD-477.9) Assessment: Deteriorated  Her updated medication list for this problem includes:    Loratadine 10 Mg Tabs (Loratadine) ..... Once daily as needed allergies    Fluticasone Propionate 50 Mcg/act Susp (Fluticasone propionate) .Marland Kitchen... 2 sprays each nostril once daily  Problem # 3:  ACUTE SINUSITIS, UNSPECIFIED (ICD-461.9)  The following medications were removed from the medication list:    Doxycycline Hyclate 100 Mg Caps (Doxycycline hyclate) .Marland Kitchen... Take 1 tab twice a day Her updated medication list for this problem includes:    Fluticasone Propionate 50 Mcg/act Susp (Fluticasone propionate) .Marland Kitchen... 2 sprays each nostril once daily    Zithromax Z-pak 250 Mg Tabs (Azithromycin) ..... Use as directed  Problem # 4:  HYPERTENSION (ICD-401.9) Assessment: Comment Only  The following medications were removed from the medication list:    Benazepril Hcl 5 Mg Tabs (Benazepril hcl) .Marland Kitchen... 1 tablet by mouth daily Her updated medication list for this problem includes:    Amlodipine Besylate 5 Mg Tabs (Amlodipine besylate) .Marland Kitchen... 1 by mouth every day  Complete Medication List: 1)  Vitamin D3 1000 Unit Tabs (Cholecalciferol) .Marland Kitchen.. 1 by mouth daily 2)  Amlodipine Besylate 5  Mg Tabs (Amlodipine besylate) .Marland Kitchen.. 1 by mouth every day 3)  Loratadine 10 Mg Tabs (Loratadine) .... Once daily as needed allergies 4)  Fluticasone Propionate 50 Mcg/act Susp (Fluticasone propionate) .... 2 sprays each nostril once daily 5)  Zithromax Z-pak 250 Mg Tabs (Azithromycin) .... Use as directed  Patient Instructions: 1)  Please schedule a follow-up appointment in 2 months. 2)  BMP prior to visit, ICD-9: 3)  Hepatic Panel prior to visit, ICD-9:401.1  995.20 4)  TSH prior to visit, ICD-9:  Prescriptions: ZITHROMAX Z-PAK 250 MG  TABS (AZITHROMYCIN) Use as directed  #1 x 0   Entered and Authorized by:   Tresa Garter MD   Signed by:   Tresa Garter MD on 10/24/2008   Method used:   Print then Give to Patient   RxID:   [id number] FLUTICASONE PROPIONATE 50 MCG/ACT  SUSP (FLUTICASONE PROPIONATE) 2 sprays each nostril once daily  #1 vial x 3   Entered and Authorized by:   Tresa Garter MD   Signed by:   Tresa Garter MD on 10/24/2008   Method used:   Print then Give to Patient   RxID:   [id number] LORATADINE 10 MG  TABS (LORATADINE) once daily as needed allergies  #30 x 12   Entered and Authorized by:   Tresa Garter MD   Signed by:   Tresa Garter MD on 10/24/2008   Method used:   Print then Give to Patient   RxID:   [id number] AMLODIPINE BESYLATE 5 MG  TABS (AMLODIPINE BESYLATE) 1 by mouth every day  #30 x 12   Entered and Authorized by:   Tresa Garter MD   Signed by:   Tresa Garter MD on 10/24/2008   Method used:   Print then Give to Patient   RxID:   [id number]

## 2010-08-12 NOTE — Progress Notes (Signed)
Summary: ELEVATED BP  Phone Note Call from Patient Call back at [facility] Phone [phone]   Summary of Call: Pt's bp has been running 149/103. She was told to take new med if bp remained high. Should she start the verapamil 120mg ?  Initial call taken by: Lamar Sprinkles, CMA,  June 22, 2010 12:28 PM  Follow-up for Phone Call        yes, please Follow-up by: Tresa Garter MD,  June 22, 2010 4:15 PM  Additional Follow-up for Phone Call Additional follow up Details #1::        Pt informed  Additional Follow-up by: Lamar Sprinkles, CMA,  June 22, 2010 4:23 PM    New/Updated Medications: VERAPAMIL HCL CR 120 MG XR24H-CAP (VERAPAMIL HCL) 1 once daily

## 2010-08-12 NOTE — Progress Notes (Signed)
Summary: Verapamil  Phone Note Call from Patient Call back at [phone]   Summary of Call: Patient is requesting rx for verapamil to be changed from CR to reg. [facility] has veramamil 80mg  on 4$ list. Is this an option?  Initial call taken by: Lamar Sprinkles, CMA,  June 23, 2010 3:41 PM  Follow-up for Phone Call        OK Follow-up by: Tresa Garter MD,  June 23, 2010 5:33 PM  Additional Follow-up for Phone Call Additional follow up Details #1::        Pt informed  Additional Follow-up by: Lamar Sprinkles, CMA,  June 23, 2010 5:38 PM    New/Updated Medications: VERAPAMIL HCL 80 MG TABS (VERAPAMIL HCL) 1 by mouth bid Prescriptions: VERAPAMIL HCL 80 MG TABS (VERAPAMIL HCL) 1 by mouth bid  #180 x 3   Entered by:   Lamar Sprinkles, CMA   Authorized by:   Tresa Garter MD   Signed by:   Lamar Sprinkles, CMA on 06/23/2010   Method used:   Electronically to        [facility] [id number] (retail)       [address]       [place], [place]  [address]       Ph: [phone]       Fax: [phone]   RxID:   [id number]

## 2010-08-30 ENCOUNTER — Telehealth (INDEPENDENT_AMBULATORY_CARE_PROVIDER_SITE_OTHER): Payer: Self-pay | Admitting: *Deleted

## 2010-09-03 ENCOUNTER — Other Ambulatory Visit: Payer: Self-pay

## 2010-09-10 ENCOUNTER — Encounter: Payer: Self-pay | Admitting: Internal Medicine

## 2010-09-10 ENCOUNTER — Ambulatory Visit (INDEPENDENT_AMBULATORY_CARE_PROVIDER_SITE_OTHER): Payer: Medicare Other | Admitting: Internal Medicine

## 2010-09-10 ENCOUNTER — Other Ambulatory Visit: Payer: Medicare Other

## 2010-09-10 ENCOUNTER — Other Ambulatory Visit: Payer: Self-pay | Admitting: Internal Medicine

## 2010-09-10 DIAGNOSIS — E669 Obesity, unspecified: Secondary | ICD-10-CM

## 2010-09-10 DIAGNOSIS — R05 Cough: Secondary | ICD-10-CM

## 2010-09-10 DIAGNOSIS — R059 Cough, unspecified: Secondary | ICD-10-CM

## 2010-09-10 DIAGNOSIS — F39 Unspecified mood [affective] disorder: Secondary | ICD-10-CM

## 2010-09-10 DIAGNOSIS — I10 Essential (primary) hypertension: Secondary | ICD-10-CM

## 2010-09-10 LAB — BASIC METABOLIC PANEL
BUN: 23 mg/dL (ref 6–23)
CO2: 24 mEq/L (ref 19–32)
Calcium: 9.7 mg/dL (ref 8.4–10.5)
Chloride: 106 mEq/L (ref 96–112)
Creatinine, Ser: 0.9 mg/dL (ref 0.4–1.2)
GFR: 69.47 mL/min (ref >60.00)
Glucose, Bld: 117 mg/dL — ABNORMAL HIGH (ref 70–99)
Potassium: 4 mEq/L (ref 3.5–5.1)
Sodium: 140 mEq/L (ref 135–145)

## 2010-09-16 NOTE — Assessment & Plan Note (Signed)
Summary: 3 MO FU/NWS#   Vital Signs:  Patient profile:   55 year old female Height:      62 inches Weight:      154 pounds BMI:     28.27 Temp:     98.3 degrees F oral Pulse rate:   92 / minute Pulse rhythm:   regular Resp:     16 per minute BP sitting:   120 / 90  (left arm) Cuff size:   regular  Vitals Entered By: Lanier Prude, CMA(AAMA) (September 10, 2010 1:34 PM) CC: 3 mo f/u  Is Patient Diabetic? No   CC:  3 mo f/u .  History of Present Illness: F/u HTN, depression C/o wt gain and obesity  Current Medications (verified): 1)  Vitamin D3 1000 Unit  Tabs (Cholecalciferol) .Marland Kitchen.. 1 By Mouth Daily 2)  Loratadine 10 Mg  Tabs (Loratadine) .... Once Daily As Needed Allergies 3)  Fluticasone Propionate 50 Mcg/act  Susp (Fluticasone Propionate) .... 2 Sprays Each Nostril Once Daily 4)  Klor-Con 10 10 Meq Cr-Tabs (Potassium Chloride) .Marland Kitchen.. 1 Once Daily 5)  Fluocinolone Acetonide 0.01 % Soln (Fluocinolone Acetonide) .... Use Once Daily On A Scalp  Rash 6)  Pennsaid 1.5 % Soln (Diclofenac Sodium) .... 2-3 Gtt On Skin Three Times A Day For Pain 7)  Edarbi 80 Mg Tabs (Azilsartan Medoxomil) .Marland Kitchen.. 1 By Mouth Once Daily For Blood Pressure 8)  Furosemide 20 Mg Tabs (Furosemide) .... Take 1 Tab By Mouth Every Day 9)  Fluoxetine Hcl 10 Mg Tabs (Fluoxetine Hcl) .Marland Kitchen.. 1 By Mouth Once Daily 10)  Verapamil Hcl 80 Mg Tabs (Verapamil Hcl) .Marland Kitchen.. 1 By Mouth Bid  Allergies (verified): 1)  ! Sulfa 2)  ! Hydrochlorothiazide 3)  Benazepril Hcl (Benazepril Hcl) 4)  Amlodipine Besylate (Amlodipine Besylate) 5)  Spironolactone (Spironolactone) 6)  * Benicar 7)  Coreg (Carvedilol)  Physical Exam  General:  NAD Nose:  Erythematous throat mucosa and intranasal erythema.  Mouth:  Erythematous throat mucosa and intranasal erythema.  Neck:  No deformities, masses, or tenderness noted. Lungs:  CTA Heart:  RRR Abdomen:  Bowel sounds positive,abdomen soft and non-tender without masses, organomegaly or  hernias noted. Msk:  No deformity or scoliosis noted of thoracic or lumbar spine.   Neurologic:  No cranial nerve deficits noted. Station and gait are normal. Plantar reflexes are down-going bilaterally. DTRs are symmetrical throughout. Sensory, motor and coordinative functions appear intact. Skin:  Intact without suspicious lesions or rashes Psych:  Cognition and judgment appear intact. Alert and cooperative with normal attention span and concentration. No apparent delusions, illusions, hallucinations Not depressed   Impression & Recommendations:  Problem # 1:  HYPERTENSION (ICD-401.9)  Her updated medication list for this problem includes:    Edarbi 80 Mg Tabs (Azilsartan medoxomil) .Marland Kitchen... 1 by mouth once daily for blood pressure    Furosemide 20 Mg Tabs (Furosemide) .Marland Kitchen... Take 1 tab by mouth every day    Verapamil Hcl 80 Mg Tabs (Verapamil hcl) .Marland Kitchen... 1 by mouth bid  Orders: TLB-BMP (Basic Metabolic Panel-BMET) (80048-METABOL)  Problem # 2:  OBESITY (ICD-278.00) Assessment: Unchanged Risks vs benefits and controversies of a long term Phentermine use were discussed.   Problem # 3:  MOOD DISORDER (ICD-296.90)  Problem # 4:  INSOMNIA, PERSISTENT (ICD-307.42) Assessment: Improved  Problem # 5:  COUGH (ICD-786.2)/URI Assessment: Improved  Complete Medication List: 1)  Vitamin D3 1000 Unit Tabs (Cholecalciferol) .Marland Kitchen.. 1 by mouth daily 2)  Loratadine 10 Mg Tabs (Loratadine) .Marland KitchenMarland KitchenMarland Kitchen  Once daily as needed allergies 3)  Fluticasone Propionate 50 Mcg/act Susp (Fluticasone propionate) .... 2 sprays each nostril once daily 4)  Klor-con 10 10 Meq Cr-tabs (Potassium chloride) .Marland Kitchen.. 1 once daily 5)  Fluocinolone Acetonide 0.01 % Soln (Fluocinolone acetonide) .... Use once daily on a scalp  rash 6)  Pennsaid 1.5 % Soln (Diclofenac sodium) .... 2-3 gtt on skin three times a day for pain 7)  Edarbi 80 Mg Tabs (Azilsartan medoxomil) .Marland Kitchen.. 1 by mouth once daily for blood pressure 8)  Furosemide 20 Mg  Tabs (Furosemide) .... Take 1 tab by mouth every day 9)  Fluoxetine Hcl 10 Mg Tabs (Fluoxetine hcl) .Marland Kitchen.. 1 by mouth once daily 10)  Verapamil Hcl 80 Mg Tabs (Verapamil hcl) .Marland Kitchen.. 1 by mouth bid 11)  Phentermine Hcl 37.5 Mg Tabs (Phentermine hcl) .Marland Kitchen.. 1 by mouth qam  Patient Instructions: 1)  Please schedule a follow-up appointment in 4 months. 2)  Use over-the-counter medicines for "cold": Tylenol  650mg  or Advil 400mg  every 6 hours  for fever; Delsym or Robutussin for cough. Mucinex  for congestion. Ricola or Halls for sore throat. Office visit if not better or if worse.  Prescriptions: PHENTERMINE HCL 37.5 MG TABS (PHENTERMINE HCL) 1 by mouth qam  #30 x 2   Entered and Authorized by:   Tresa Garter MD   Signed by:   Tresa Garter MD on 09/10/2010   Method used:   Print then Give to Patient   RxID:   [id number]    Orders Added: 1)  TLB-BMP (Basic Metabolic Panel-BMET) [80048-METABOL] 2)  Est. Patient Level IV [14782]

## 2010-09-16 NOTE — Progress Notes (Signed)
Summary: WORK IN APT  Phone Note Call from Patient Call back at Home Phone [phone]   Summary of Call: Patient is requesting work in apt wed pm - has had a cough x 1 mth and wants MD to eval.  Initial call taken by: Lamar Sprinkles, CMA,  August 30, 2010 3:31 PM  Follow-up for Phone Call        ?? 11:15 am Thank you!  Follow-up by: Tresa Garter MD,  August 30, 2010 6:46 PM  Additional Follow-up for Phone Call Additional follow up Details #1::        Please help with above Additional Follow-up by: Lamar Sprinkles, CMA,  August 31, 2010 9:16 AM    Additional Follow-up for Phone Call Additional follow up Details #2::    appt made in [facility] for March 2nd Follow-up by: Verdell Face,  September 10, 2010 1:17 PM

## 2010-11-15 ENCOUNTER — Encounter: Payer: Self-pay | Admitting: Internal Medicine

## 2010-11-15 ENCOUNTER — Telehealth: Payer: Self-pay | Admitting: *Deleted

## 2010-11-15 NOTE — Telephone Encounter (Signed)
Pt c/o eye redness, swelling and yellow-milky drainage. She wanted advice - I transferred her to scheduler for OV w/ MD tomorrow (could not come in today) I advised her to stop using the abx drops that were her grandson's, use warm compresses to help remove crust/puss buildup, use otc saline eyedrops for lubrication and wash hands & pillowcases frequently to help not spread to her other eye or other people.

## 2010-11-16 ENCOUNTER — Encounter: Payer: Self-pay | Admitting: Internal Medicine

## 2010-11-16 ENCOUNTER — Ambulatory Visit (INDEPENDENT_AMBULATORY_CARE_PROVIDER_SITE_OTHER): Payer: Medicare Other | Admitting: Internal Medicine

## 2010-11-16 VITALS — BP 110/80 | HR 80 | Temp 98.6°F | Ht 62.0 in | Wt 146.4 lb

## 2010-11-16 DIAGNOSIS — J309 Allergic rhinitis, unspecified: Secondary | ICD-10-CM

## 2010-11-16 DIAGNOSIS — H109 Unspecified conjunctivitis: Secondary | ICD-10-CM | POA: Insufficient documentation

## 2010-11-16 DIAGNOSIS — I10 Essential (primary) hypertension: Secondary | ICD-10-CM

## 2010-11-16 MED ORDER — TOBRAMYCIN 0.3 % OP SOLN: 1.0000 [drp] | Freq: Four times a day (QID) | OPHTHALMIC | Status: AC

## 2010-11-16 NOTE — Assessment & Plan Note (Signed)
stable overall by hx and exam, most recent lab reviewed with pt, and pt to continue medical treatment as before le  BP Readings from Last 3 Encounters:  11/16/10 110/80  09/10/10 120/90  06/08/10 150/100

## 2010-11-16 NOTE — Assessment & Plan Note (Signed)
stable overall by hx and exam, most recent lab reviewed with pt, and pt to continue medical treatment as before 

## 2010-11-16 NOTE — Assessment & Plan Note (Signed)
Mild to mod, for antibx course,  to f/u any worsening symptoms or concerns 

## 2010-11-16 NOTE — Patient Instructions (Signed)
Take all new medications as prescribed Continue all other medications as before  

## 2010-11-16 NOTE — Progress Notes (Signed)
Subjective:    Patient ID: Danielle [name], female    DOB: 1955/08/08, 55 y.o.   MRN: [id number]  HPI  Here with 3 days acute onset fever, left eye redness and d/c, general weakness and malaise,  with slight ST, but little to no cough and Pt denies chest pain, increased sob or doe, wheezing, orthopnea, PND, increased LE swelling, palpitations, dizziness or syncope.  Pt denies new neurological symptoms such as new headache, or facial or extremity weakness or numbness   Pt denies polydipsia, polyuria.  Does have several wks ongoing nasal allergy symptoms with clear congestion, itch and sneeze, without fever, pain, ST, cough or wheezing, and has not been using her meds consistently Past Medical History  Diagnosis Date  . OBESITY 02/20/2009  . MOOD DISORDER 02/20/2009  . INSOMNIA, PERSISTENT 02/15/2008  . HYPERTENSION 02/15/2008  . Acute sinusitis, unspecified 10/24/2008  . BRONCHITIS, ACUTE 06/13/2008  . ALLERGIC RHINITIS 10/24/2008  . GERD 02/19/2007  . 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 INTRAABDOMINAL ABSCESS 02/19/2007  . FOOT PAIN 04/02/2010  . DYSPNEA 05/07/2010  . Cough 06/13/2008  . TOBACCO USE, QUIT 09/10/2009   Past Surgical History  Procedure Date  . Abdominal hysterectomy   . Cholecystectomy   . Ercp     reports that she has quit smoking. She does not have any smokeless tobacco history on file. Her alcohol and drug histories not on file. family history includes Hypertension in her other. Allergies  Allergen Reactions  . Amlodipine Besylate     REACTION: depressed and teary  . Benazepril Hcl     REACTION: cough  . Carvedilol     REACTION: fatigue  . Hydrochlorothiazide   . Olmesartan Medoxomil     REACTION: sinus congestion  . Spironolactone     REACTION: abd pain  . Sulfonamide Derivatives    Current Outpatient Prescriptions on File Prior to Visit  Medication Sig Dispense Refill  . Azilsartan Medoxomil (EDARBI) 80 MG TABS Take by mouth daily.        .  Cholecalciferol (VITAMIN D3) 1000 UNITS CAPS Take by mouth daily.        Marland Kitchen FLUoxetine (PROZAC) 10 MG tablet Take 10 mg by mouth daily.        . furosemide (LASIX) 20 MG tablet Take 20 mg by mouth daily.        . verapamil (CALAN) 80 MG tablet Take 80 mg by mouth 2 (two) times daily.        . Diclofenac Sodium 1.5 % SOLN 2-3 drops on skin three times a day for pain       . fluocinolone (VANOS) 0.01 % cream Use once daily on scalp rash       . fluticasone (FLONASE) 50 MCG/ACT nasal spray 2 sprays by Nasal route daily.        . Loratadine 10 MG CAPS Take by mouth daily.        . phentermine (ADIPEX-P) 37.5 MG tablet Take 37.5 mg by mouth daily before breakfast.        . Potassium Chloride (KLOR-CON 10 PO) Take by mouth daily.         Review of Systems All otherwise neg per pt     Objective:   Physical Exam BP 110/80  Pulse 80  Temp(Src) 98.6 F (37 C) (Oral)  Ht 5\' 2"  (1.575 m)  Wt 146 lb 6 oz (66.395 kg)  BMI 26.77 kg/m2  SpO2 93% Physical Exam  VS  noted Constitutional: Pt appears well-developed and well-nourished.  HENT: Head: Normocephalic.  Right Ear: External ear normal.  Left Ear: External ear normal.  Eyes: Conjunctivae and EOM are normal except for left eye conjuctival erythema and mild d/c. Pupils are equal, round, and reactive to light.  Neck: Normal range of motion. Neck supple.  Cardiovascular: Normal rate and regular rhythm.   Pulmonary/Chest: Effort normal and breath sounds normal.  Abd:  Soft, NT, non-distended, + BS Neurological: Pt is alert. No cranial nerve deficit.  Skin: Skin is warm. No erythema.  Psychiatric: Pt behavior is normal. Thought content normal. 1+ nervous        Assessment & Plan:

## 2010-11-26 NOTE — Op Note (Signed)
NAME:  Danielle [name], Danielle [name]                 ACCOUNT NO.:  [id number]   MEDICAL RECORD NO.:  [id number]          PATIENT TYPE:  AMB   LOCATION:  DAY                          FACILITY:  [facility]   PHYSICIAN:  Vikki Ports, MDDATE OF BIRTH:  20-Jul-1955   DATE OF PROCEDURE:  04/18/2005  DATE OF DISCHARGE:                                 OPERATIVE REPORT   PREOPERATIVE DIAGNOSIS:  Ventral hernia.   POSTOPERATIVE DIAGNOSIS:  Ventral hernia.   PROCEDURE:  Ventral hernia repair with mesh.   SURGEON:  Vikki Ports, M.D.   ANESTHESIA:  General.   DESCRIPTION OF PROCEDURE:  The patient was taken to the operating room and  placed in the supine position. After adequate general anesthesia was induced  using endotracheal tube, the abdomen was prepped and draped in the normal  sterile fashion. Using a lower vertical midline incision through the  previous scar, I dissected down into a hernia sac. Fascial edges were freed  up from the hernia sac and the hernia sac was resected. The bowel was  protected and after the entire fascial defect, which measured about 6 inches  in length, had been freed up, flaps were created anterior to this and the  fascial defect was closed with running #1 Novofil. A piece of 6 x 6 onlay  Parietex mesh was then placed over the closure and tacked in the periphery  under the flaps with a running 2-0 Prolene. A 19 Blake drain was placed over  the mesh and the skin was closed with staples. The patient tolerated the  procedure well and went to PACU in good condition.      Vikki Ports, MD  Electronically Signed     KRH/MEDQ  D:  04/18/2005  T:  04/18/2005  Job:  [id number]

## 2010-11-26 NOTE — Op Note (Signed)
NAME:  Danielle [name], Danielle [name]                 ACCOUNT NO.:  [id number]   MEDICAL RECORD NO.:  [id number]          PATIENT TYPE:  OBV   LOCATION:  0473                         FACILITY:  [facility]   PHYSICIAN:  Amy Esterwood, P.A.-C. LHCDATE OF BIRTH:  1956/04/28   DATE OF PROCEDURE:  DATE OF DISCHARGE:                                 OPERATIVE REPORT   CHIEF COMPLAINT:  Recent right upper quadrant pain.   HISTORY:  Danielle [name] is a pleasant 55 year old [name] female who was self referred  to Dr. Russella Dar for evaluation of apparent common bile duct stones.  The  patient is status post cholecystectomy which was done in 1994 and had a  recent episode in February of 2006 with severe right upper quadrant pain  associated with nausea and vomiting. She was evaluated at the [facility] emergency room, underwent abdominal ultrasound which showed  evidence for distal common bile duct stones and a dilated common bile duct  at 12 mm. Labs were unremarkable with normal LFT's and CBC.  When she was  seen by Dr. Russella Dar on February 28, she was really asymptomatic and at that  time set up for ERCP.  Prior to that time, she had been seen by Dr. Russella Dar  for reflux symptoms and had had one episode of epigastric pain I believe in  January 2006. At this time, she has undergone ERCP today which revealed five  distal common bile duct stones, sphincterotomy was done.  One stone was  extracted and the other larger stones were felt to be impacted and were not  able to be removed. Therefore a stent was placed which she tolerated well.  The plan at this time is for overnight observation and then repeat ERCP in  approximately four weeks with stent removal and stone extraction, possible  stone lithotripsy.   CURRENT MEDICATIONS:  Protonix 40 p.o. q.d.   ALLERGIES:  SULFA.   PAST MEDICAL HISTORY:  Pertinent for cholecystectomy in 1994 and recently  diagnosed hyperlipidemia which is being managed with diet.   FAMILY HISTORY:   Noncontributory.   SOCIAL HISTORY:  The patient is married, she does have children, she is an  exsmoker, no regular ETOH.   REVIEW OF SYMPTOMS:  CARDIOVASCULAR:  Denies any chest pain or anginal  symptoms. PULMONARY:  Negative for cough, shortness of breath or sputum  production. GENITOURINARY:  Negative for dysuria, urgency or frequency. GI:  As above.   PHYSICAL EXAMINATION:  GENERAL:  Well-developed [name] female in no acute  distress.  VITAL SIGNS:  Temperature is 98, blood pressure 144/90, pulse in the 80's.  HEENT:  Normocephalic, atraumatic. EOMI. PERRLA. Sclera anicteric.  CARDIOVASCULAR:  Regular rate and rhythm with S1 and S2.  PULMONARY:  Clear to A & P.  ABDOMEN:  Soft, basically nontender. There is no mass or hepatosplenomegaly.  Bowel sounds are active.  RECTAL:  Not done at this time.  EXTREMITIES:  Without cyanosis, clubbing or edema.  NEUROLOGIC:  Nonfocal.   IMPRESSION:  1.  A 55 year old [name] female with choledocholithiasis status post ERCP,      sphincterotomy,  partial stone extraction and stone placement today.  2.  Status post cholecystectomy in 1994.  3.  Hyperlipidemia.   PLAN:  The patient is admitted to the hospital for overnight observation  post ERCP. She will kept at bowel rest, placed on empiric antibiotics and  will plan to discharge her in the a.m. September 11, 2004 if she is pain free.  She will be scheduled for repeat ERCP in approximately four weeks. For  details, please see the orders.      AE/MEDQ  D:  09/10/2004  T:  09/10/2004  Job:  [id number]

## 2010-11-26 NOTE — Consult Note (Signed)
NAME:  Danielle [name], Danielle [name]                 ACCOUNT NO.:  [id number]   MEDICAL RECORD NO.:  [id number]          PATIENT TYPE:  INP   LOCATION:  0153                         [facility]AC[facility]   PHYSICIAN:  Vikki Ports, MDDATE OF BIRTH:  [birth year]   DATE OF CONSULTATION:  12/06/2004  DATE OF DISCHARGE:                                   CONSULTATION   REASON FOR CONSULTATION:  Abdominal pain.   HISTORY OF PRESENT ILLNESS:  The patient is a 55 year old Danielle [name] female who  underwent laparoscopic cholecystectomy in 1993.  The patient then presented  with primary common duct stones 6 weeks ago.  She underwent ERCP,  sphincterotomy, and placement of a stent at that time.  She was seen back 6  days ago for repeat ERCP, stone extraction, and removal of the stent.  Postoperatively, the patient went home and presented back with obvious  pancreatitis.  She has been watched here in the hospital, but began having  increasing tachypnea over the last 24-36 hours.  CT scan showed free fluid  in the peritoneum and in the pelvis, and more fluid in retroperitoneum  extending along the right perirenal space and the right pericolic gutter.  HIDA scan was negative for any evidence of biliary extravasation.  The  patient was moved to the intensive care unit today.   CURRENT MEDICATIONS:  1. Protonix.  2. Morphine.  3. Primaxin.     PAST MEDICAL HISTORY:  Significant only for above.   PAST SURGICAL HISTORY:  Significant for above and abdominal hysterectomy.   PHYSICAL EXAMINATION:  GENERAL:  She is an age-appropriate Danielle [name] female in  moderate distress.  She is somewhat tachypneic with a respiratory rate in  the low 30s.  She claims to be feeling no different than she has the last  couple or three days.  She did feel somewhat better yesterday.  VITAL SIGNS:  Her temperature is 100.4, blood pressure 122/70, heart rate  125.  HEENT:  Normocephalic and atraumatic.  There is no evidence of icterus.  LUNGS:  Clear to auscultation and percussion.  HEART:  Tachycardic but with regular rhythm.  ABDOMEN:  Slightly distended and tender in bilateral lower quadrants.  Minimally tender in the upper abdomen.   IMPRESSION:  History of pancreatitis, now with normal pancreatic enzymes,  stable Danielle [name] count of 14,000, and a slightly elevated alkaline phosphatase  of 144.  I feel this fluid may be secondary to pancreatitis.  Gastrografin  swallow and upper GI this morning shows no evidence of a leak from the  duodenum, and there is no air in the retroperitoneum.  This may all be  secondary to pancreatitis, but I think we need to examine the fluid that is  in the abdomen and make sure is it more consistent with pancreatic  sympathetic peritoneal effusion versus bile or succus.   PLAN:  CT-guided aspiration of the fluid tomorrow, and pending those  results, she may or may not need operative intervention.      KRH/MEDQ  D:  12/06/2004  T:  12/06/2004  Job:  [id number]  cc:   Judie Petit T. Russella Dar, M.D. [facility]

## 2010-11-26 NOTE — Discharge Summary (Signed)
NAME:  [name], Danielle                 ACCOUNT NO.:  [id number]   MEDICAL RECORD NO.:  [id number]          PATIENT TYPE:  INP   LOCATION:  1417                         FACILITY:  [facility]   PHYSICIAN:  Vikki Ports, MDDATE OF BIRTH:  06-16-1956   DATE OF ADMISSION:  02/01/2005  DATE OF DISCHARGE:  02/10/2005                                 DISCHARGE SUMMARY   ADMISSION DIAGNOSIS:  Abdominal abscess.   DISCHARGE DIAGNOSIS:  Abdominal abscess.   CONDITION ON DISCHARGE:  Good and improved.   FINAL DIAGNOSIS:  Intra-abdominal or retroperitoneal abscess.   Discharge to home. Follow up with me 1 week after discharge.   HOSPITAL COURSE:  The patient was admitted with nausea, vomiting and  evidence of intra-abdominal abscess. She was started on TNA and antibiotics.  CT scan showed recurrent retroperitoneal abscess. She was taken to the  operating room and after CT-guided placement of drain she had open drainage  in the operating room under general anesthesia. Over the next 2-3 days she  felt much better. Her hemoglobin did drop to 7.6 on postoperative day #3 and  she was transfused 2 units of blood. The wounds were draining nicely. She  fell much better, was tolerating p.o., and was discharged home.      Vikki Ports, MD  Electronically Signed     KRH/MEDQ  D:  02/17/2005  T:  02/17/2005  Job:  [id number]

## 2010-11-26 NOTE — Op Note (Signed)
NAME:  Danielle [name], Danielle [name]                 ACCOUNT NO.:  [id number]   MEDICAL RECORD NO.:  [id number]          PATIENT TYPE:  AMB   LOCATION:  DAY                          FACILITY:  [facility]   PHYSICIAN:  Alfonse Ras, MD   DATE OF BIRTH:  May 23, 1956   DATE OF PROCEDURE:  02/22/2006  DATE OF DISCHARGE:                                 OPERATIVE REPORT   PREOPERATIVE DIAGNOSIS:  Ventral hernia.   POSTOPERATIVE DIAGNOSIS:  Ventral hernia.   PROCEDURE:  Open ventral hernia repair with allograft MTS mesh.   SURGEON:  Alfonse Ras, MD   ASSISTANT:  Thornton Park. Daphine Deutscher, MD.   DESCRIPTION:  The patient was taken to the operating placed and in the  supine position.  After adequate general anesthesia was induced using  endotracheal tube, the abdomen was prepped and draped in a normal sterile  fashion.  Using the previous upper vertical midline incision, I dissected  down onto the hernia sac.  It was dissected off the fascial edges, which  were quite attenuated.  Because of the significant amount of stress on the  fascial tissue, I opted to place an intra-abdominal piece of allograft MTF  mesh sutured to the periphery of the fascia with horizontal mattress 0  Prolene sutures.  This was done all around the periphery of the incision  after small flaps were created.  The fascia was then minimally brought back  together over top of the mesh with interrupted 0 Prolene sutures.  Adequate  hemostasis was assured.  Subcutaneous tissue was closed with a few  interrupted 2-0 Vicryls.  Skin was closed with staples.  Abdominal binder  was placed.  The patient tolerated the procedure well, went to PACU in good  condition.      Alfonse Ras, MD  Electronically Signed     KRE/MEDQ  D:  02/22/2006  T:  02/22/2006  Job:  [id number]

## 2010-11-26 NOTE — Op Note (Signed)
NAME:  Rayos, Stephaney                 ACCOUNT NO.:  [id number]   MEDICAL RECORD NO.:  [id number]          PATIENT TYPE:  INP   LOCATION:  1429                         [facility]AC[facility]   PHYSICIAN:  Lorre Munroe., M.D.DATE OF BIRTH:  02/22/1956   DATE OF PROCEDURE:  01/05/2005  DATE OF DISCHARGE:                                 OPERATIVE REPORT   PREOPERATIVE DIAGNOSIS:  Abdominal and subcutaneous abscess lower abdomen.   POSTOPERATIVE DIAGNOSIS:  Abdominal and subcutaneous abscess lower abdomen.   OPERATION:  Incision and drainage of abscess/.   SURGEON:  Lebron Conners, M.D.   ANESTHESIA:  Local.   DESCRIPTION OF PROCEDURE:  After the patient received morphine and had  preparation and skin of the lower abdomen at the bedside, I thoroughly  anesthetized the skin over the tender mass.  I made a midline incision about  4 cm in length and went down through the subcutaneous tissues and into a  large abscess cavity containing large amount of pus.  I drained pus and  cleansed the cavity somewhat and reached in with my finger to break up  ramifications of the abscess.  I did that as much as the patient could  tolerate.  Bleeding was not a problem.  I packed the cavity deeply with  gauze and applied a bulky bandage.  She tolerated it well.       WB/MEDQ  D:  01/05/2005  T:  01/06/2005  Job:  [id number]

## 2010-11-26 NOTE — Discharge Summary (Signed)
NAME:  [name], Danielle                 ACCOUNT NO.:  [id number]   MEDICAL RECORD NO.:  [id number]          PATIENT TYPE:  INP   LOCATION:  1429                         FACILITY:  [facility]   PHYSICIAN:  Vikki Ports, MDDATE OF BIRTH:  08-31-1955   DATE OF ADMISSION:  01/05/2005  DATE OF DISCHARGE:  01/15/2005                                 DISCHARGE SUMMARY   ADMISSION DIAGNOSES:  1.  Recurrent fevers.  2.  Intra-abdominal abscess status exploratory laparotomy.   DISCHARGE DIAGNOSES:  1.  Recurrent fevers.  2.  Intra-abdominal abscess status exploratory laparotomy.   CONDITION ON DISCHARGE:  Good and improved. Follow-up is with me in 2 weeks.   HOSPITAL COURSE:  The patient was admitted, underwent incision and drainage  of an abdominal wall abscess by Dr. Orson Slick, was started on IV antibiotics.  Underwent CT scan which also showed a right retroperitoneal abscess which  was percutaneously drained. The patient was maintained on IV antibiotics for  coverage of Staphylococcus aureus which grew out of the wound. She had a  repeat CT scan and was continued on antibiotics. She continued to complain  of some nausea and vomiting, remained in the hospital. We repeated another  CT and placed ab additional drain on January 12, 2005. Those were both draining  well. She was starting to feel better and by July 8 she remained afebrile;  [place] count was 13,000. Her hypokalemia had been repleted and she was ready  to be discharged home.      Vikki Ports, MD  Electronically Signed     KRH/MEDQ  D:  02/17/2005  T:  02/17/2005  Job:  [id number]

## 2010-11-26 NOTE — H&P (Signed)
NAME:  Danielle [name], Danielle [name]                 ACCOUNT NO.:  [id number]   MEDICAL RECORD NO.:  [id number]          PATIENT TYPE:   LOCATION:                                 FACILITY:   PHYSICIAN:  Malcolm T. Russella Dar, M.D. [facility]  DATE OF BIRTH:   DATE OF ADMISSION:  02/01/2005  DATE OF DISCHARGE:                                HISTORY & PHYSICAL   CHIEF COMPLAINT:  Nausea and vomiting for 1-1/2 weeks.   HISTORY:  Patient is a very nice 55 year old [place] female known to the GI  service and surgical service with recent complicated course. She had  initially undergone ERCP and stone extraction with Dr. Russella Dar in March of  2006 as well as biliary stent placement secondary to large common bile duct  stones. She had second ERCP with Dr. Arlyce Dice in May of 2006 with removal of  the stent and stone extraction.  She developed of pain post-procedure and  was initially felt to have of post-ERCP pancreatitis, but with further  diagnostic evaluation found to have a duodenal perforation which required  laparotomy to seal reinforcement of the lateral wall of the duodenum,  duodenorrhaphy and drainage of intra-abdominal abscesses.   She was readmitted to the hospital January 05, 2005 with fever and abdominal  pain and found on CT scan to have extensive retroperitoneal fluid collection  on the right; this was percutaneously drained. She was discharged home with  the right lower quadrant drain in place. She relates onset of nausea and  vomiting while hospitalized during that last day; but was, at that time,  able to keep down some p.o.'s. It was felt that her of nausea and vomiting,  multifactorial with the abscess antibiotics, etcetera.  Now, over the past 1-  1/2 weeks she has not been on any antibiotics, has been on Diflucan for  oropharyngeal Candidiasis and at home says that she began having nausea and  vomiting and has been unable to keep anything down for the past several days  including liquids. She says  everything she eats comes back up.  She does not  have any appetite; says that she has a bad metallic taste in her mouth. She  also says that when she vomits she has a thick mucous-like discharge with  her emesis.  She has minimal complaints of abdominal pain, has not been  using any pain medication and is not currently on any antibiotics. She  denies any recent fever or chills; had not had any bowel movement for 3 days  prior to admission, did have a small bowel movement on the day of admission.  The patient had been seen by Dr. Luan Pulling last week and had undergone  upper GI series on 01/28/2005 which was negative for any evidence of gastric  outlet obstruction. She was found to have a low potassium at that time which  was replaced IV as an outpatient with 5 bags. She also had abdominal CT  scan abdomen and pelvis on 07/21 which showed a persistent right  retroperitoneal fluid collection, decrease in size from previous, but still  measuring 4.5  x 2 cm; felt to be complex with thick loculated material.  Posterior to the right kidney, there was another collection 3.6 x 1.4 cm. At  this time she is seen in the emergency room and noted to have a potassium of  2.2; is admitted to the hospital for hydration, IV potassium replacement,  surgical consultation, and also will proceed with EGD to further evaluate  esophagus and duodenum.   CURRENT MEDICATIONS:  1.  Diflucan 100 mg daily.  2.  Protonix 40 p.o. q.d.   ALLERGIES:  SULFA.   PAST HISTORY:  Benign. She is status post cholecystectomy in 1993 and a  hysterectomy. Otherwise as outlined above.   FAMILY HISTORY:  See old records.   SOCIAL HISTORY:  The patient is married.  No EtOH and no tobacco.   REVIEW OF SYSTEMS:  CARDIOVASCULAR:  Denies any chest pain or anginal  symptoms. PULMONARY:  Negative for cough, shortness of breath or sputum  production. GENITOURINARY:  Negative for discharge, urgency, or frequency.  GI:  As above.    LABORATORY STUDIES:  Show a WBC of 12.9, hemoglobin 11.7, hematocrit of  33.7.  Albumin 2.4, sodium 131, potassium 2.2, creatinine 0.2. Liver  function studies normal. KUB on admission shows a question of right lower  lobe atelectasis versus pneumonia; bowel gas pattern unremarkable.   PHYSICAL EXAMINATION:  GENERAL:  A well-developed [place] female in no acute  distress. She is ill appearing and thin.  VITAL SIGNS:  Temperature is 99.2, blood pressure 153/108, pulse is 110. Her  weight is down from usual of 145 to 113  HEENT:  Buccal mucosa is dry, whitish.  There is no definite oral thrush.  CARDIOVASCULAR:  Regular rhythm with S1-S2.  PULMONARY:  Clear to A&P.  ABDOMEN:  Multiple incisional scars and drain scars.  She is tender in the  right lower quadrant.  There is no mass. She does has a midabdominal  dressing from abdominal wall abscess which had been drained.  The dressing  is dry.  RECTAL:  Not done at this time.  EXTREMITIES:  No clubbing, cyanosis or edema.   IMPRESSION:  1.  A 55 year old female with refractory nausea, vomiting, rule out duodenal      stricture secondary to recent perforation rule out peptic ulcer disease,      rule out candidiasis.  2.  Persistent intra-abdominal abscesses question need for further drainage      and possibly contributing to above symptoms.  3.  Severe hypokalemia.  4.  Duodenal perforation 11/2004 with ERCP and stone extraction complicated      by abdominal sepsis and multiple other intra-abdominal abscesses.  5.  Oropharyngeal Candidiasis treated with Diflucan.   PLAN:  The patient is admitted to the service of Dr. Claudette Head for IV  fluid hydration, intravenous potassium replacement.  Will plan on upper  endoscopy today; and then depending on EGD will need further drainage of the  right retroperitoneal fluid collection.  Will ask for a surgical consultation. She will be placed on round-the-clock antiemetics.  For  further details  please see the orders.       AE/MEDQ  D:  02/02/2005  T:  02/02/2005  Job:  [id number]   cc:   Venita Lick. Russella Dar, M.D. [facility]   Vikki Ports, MD  [address]  [place]  [place] [address]

## 2010-11-26 NOTE — Assessment & Plan Note (Signed)
[facility]                                 ON-CALL NOTE   NAME:[name], Danielle                          MRN:          [id number]  DATE:09/22/2007                            DOB:          1955/09/03    Phone number [phone].  Primary:  Danielle Quint. Plotnikov, MD.   SUBJECTIVE:  Danielle [name] has been exposed to influenza type A.  She was  calling wondering whether she needs to be treated with a preventative  medication or vaccine.   ASSESSMENT/PLAN:  Discussed the fact that the vaccine at this point in  time would not be helpful.  In addition, type A is not covered by the  vaccine as far as we know.  She would be a candidate for Relenza, which  seems to be treating type A per the CDC, as opposed to Tamiflu.  We  discussed this medication and she states that she cannot afford an  expensive medication at this time.  She will call back if she begins to  have symptoms.  She will avoid her husband with diabetes to avoid him  getting sick.     Kerby Nora, MD  Electronically Signed    AB/MedQ  DD: 09/22/2007  DT: 09/23/2007  Job #: [id number]

## 2010-11-26 NOTE — Discharge Summary (Signed)
NAME:  Danielle [name], Danielle [name]                 ACCOUNT NO.:  [id number]   MEDICAL RECORD NO.:  [id number]          PATIENT TYPE:  INP   LOCATION:  1430                         FACILITY:  [facility]   PHYSICIAN:  Barbette Hair. Arlyce Dice, M.D. [facility] OF BIRTH:  [birth year]   DATE OF ADMISSION:  11/30/2004  DATE OF DISCHARGE:  12/17/2004                                 DISCHARGE SUMMARY   ADMISSION DIAGNOSES:  32.  A 55 year old [place] female with acute post ERCP pancreatitis plus/minus      retained common bile duct stone.  2.  Choledocholithiasis with multiple common bile duct stones status post      ERCP, stone extraction and stent placement March 2006 and second      procedure on the day of this admission with multiple common bile duct      stones extracted, sphincterotomy done and stent removed.  3.  Status post remote cholecystectomy.  4.  Status post hysterectomy.  5.  Gastroesophageal reflux disease.   DISCHARGE DIAGNOSES:  1.  Resolved post ERCP pancreatitis.  2.  Day #12 status post laparotomy for intraabdominal abscess and contained      duodenal perforation.  3.  Klebsiella intraabdominal abscess secondary to duodenal perforation.  4.  Anemia multifactorial, stable.  5.  Hypoalbuminemia.  6.  __________, resolved.  7.  Atelectasis improving.   CONSULTATIONS:  1.  Dr. Luan Pulling, surgery.  2.  Dr. Sherene Sires, pulmonary.   PROCEDURE:  1.  Dec 07, 2004 with exploratory laparotomy, drainage of intraabdominal      abscess, pulse lavage of the retroperitoneum and Tisseel reinforcement      of the lateral wall of the duodenum and duodenorrhaphy. JP placement x2.  2.  Percutaneous drainage of right retroperitoneal abscess Dec 07, 2004, Dr.      Kirtland Bouchard. Jearld Lesch, M.D.  3.  CT scan abdomen x3.   HISTORY:  Danielle [name] is a pleasant generally healthy 54 year old [place] female  known to Dr. Russella Dar who is status post remote cholecystectomy in 1993, had  undergone ERCP with Dr. Russella Dar in March of 2006 at which time she  was found  to have a common bile duct stone so one stone was removed and a stent was  placed with 4-5 residual stones unable to be retrieved due to size. She did  well after this procedure and then was scheduled for followup ERCP which was  arranged with Dr. Arlyce Dice on May 23. She tolerated the procedure well, had 4-  5 discreet fairly large stones removed, sphincterotomy was done and the old  stent was removed as well. She felt fine after the procedure and was allowed  discharge to home. However, apparently on her way home, she developed some  mild epigastric discomfort which then progressed throughout the afternoon.  She called our office at about 5:15 p.m., stated she was having pain about  6/10 and was advised that she may need to be admitted for pain control for  probable pancreatitis. At that time, she had wanted to try to stay home, was  called in some Vicodin and asked to stay  on clear liquids. Apparently around  9 p.m., her pain had progressed to the point that she could not tolerate it  at home and was brought to the emergency room by her husband, seen and  evaluated by Dr. Marina Goodell and was found to be hemodynamically stable but with  evidence for acute post ERCP pancreatitis and was admitted for bowel rest,  volume replacement, IV antibiotics, and pain control.   LABORATORY DATA:  On Nov 30, 2004, WBC was 18.9, hemoglobin 13.7, hematocrit  of 40.1, MCV of 87.5, platelets 280. Sodium 142, potassium 3.6, glucose 145,  BUN 15, creatinine 1.0, albumin of 3.9, pro time 14.6, INR of 1.2. Total  bilirubin 1.4, alkaline phosphatase 196, ALT 86, AST 202, amylase of 533 and  lipase of 932. The following day on May 24, lipase was 1522 amylase of 1268  and on May 25, lipase was 1290, amylase 2720 and on May 27 lipase 59,  amylase of 401. On May 29, lipase of 16, amylase of 72. On May 25, WBC was  16.3, hemoglobin 13.4, hematocrit of 39.5. Postoperatively on May 30, WBC of  11.9, hemoglobin 8.0,  hematocrit of 23.7. Her [place] count peaked on June 3  at 28.5, hemoglobin 11, hematocrit of 32.5. On June 6, WBC of 18.1,  hemoglobin 9.6, hematocrit of 28.3 and on June 9, WBC 16, hemoglobin 10.3,  hematocrit of 30.9. Liver enzymes had normalized by June 1 with an alkaline  phosphatase of 135, ALT of 17, AST of 22. Triglycerides were checked and  were normal. Prealbumin bottomed out at 1.8 on June 1, followup on June 5  was 3.8. Body fluid analysis on May 30, amylase was 2946, Gram stain  abundant Gram negative rods and abscess cultures grew abundant Klebsiella  oxytoca which was resistant to ampicillin and intermedial to Unasyn,  sensitive to cephalosporins. C Dif toxin was checked on several occasions  and was negative.   X-RAY STUDIES:  CT scan of the abdomen and pelvis on May 28 showed the  pancreas to appear normal, pancreatic duct normal with a large amount of  retroperitoneal fluid lateral to the duodenum and extending around the  perirenal space and into the perirenal space in the retroperitoneum on the  right extending all the way down to the pelvis, bladder displaced to the  left by the retroperitoneal fluid. Question injury to the bowel duct or  duodenum. No significant ascites in the abdomen, there is a moderately large  pocket of ascites in the pelvis. HIDA scan on May 28, no evidence of bile  leak. Chest x-ray on May 29, bibasilar atelectasis, no significant change,  Gastrografin upper GI in May 29. Gastrografin passed through the stomach and  duodenum with no extravasation seen. The stomach, duodenal bulb and  remainder of the duodenum appeared normal. CT guided abscess drainage was  done on May 30 per interventional radiology with placement of a 10 French  pigtail drain. Followup CT of the abdomen and pelvis was done on June 2.  Little change in the fluid throughout the peritoneal cavity and right retroperitoneum with scattered air bubbles throughout the peritoneal  cavity  probably due to perforated duodenum and postoperative changes, no discreet  abscess seen, drains in the right upper quadrant, air in the biliary tree,  increase in effusions, question fluid overload, coiled tip of the abscess  catheter not visible. KUB on June 2 showed no retained percutaneous pigtail  catheter fragment in the peritoneum.   HOSPITAL COURSE:  The patient was admitted to the service of Dr. Yancey Flemings  who was covering the hospital. She was admitted with what appeared to be a  post ERCP pancreatitis, was kept at bowel rest, hydrated, covered with IV  Unasyn and given Demerol and Phenergan as needed for pain. The following  day, she continued to have a significant amount of pain not well controlled  with the Demerol. She was started on a morphine PCA. We continued volume  replacement and she remained hemodynamically stable. By May 25, she was  complaining of fairly diffuse abdominal pain persistent, was a bit  tachycardic. Liver enzymes had improved but amylase and lipase were up as  was her Cas[place] count. She was having some difficulty urinating, a Foley  catheter was placed and plain abdominal films were obtained which were  negative. Chest x-ray showed bibasilar atelectasis. By May 26, she was  having evidence of respiratory insufficiency with slow drift in her O2  saturations, was requiring supplemental oxygen and was also noted to be  tachycardic. Rogoff count up to 21,000, sodium 128. Her fluids were changed.  She was seen in consultation by pulmonary, felt to have evidence of  __________  and was transferred to the step down unit for careful  monitoring. She had improved from a respiratory standpoint by May 27 and was  transferred back to the floor, felt to have resolving __________ but  remained somewhat dyspneic. She was started on TNA on the 27th. CT of the  abdomen and pelvis was obtained on the 28th which then raised the concern of  an injury of the duodenum  or bowel duct with a large fluid collection  posterior to the duodenum. A HIDA scan was subsequently obtained to rule out  bile leak and this was negative. A surgical consult was obtained and she was  transferred back to the step down unit. Upper GI was also obtained on the  29th with no evidence of extravasation. She was seen by Dr. Luan Pulling. CT  guided drainage was suggested and this was done per Dr. Miles Costain on the 30th  with purulent looking exudate. There were abundant Gram negative rods on the  initial Gram stain and therefore a decision was made that same day to  proceed with surgery for drainage of the abscess. She was taken to the OR by  Dr. Luan Pulling, underwent laparotomy, drainage of the abscess, lavage and  reinforcement of the duodenal injury with Tisseel. Slow recovery  postoperatively, was covered with Primaxin and continued on TNA. She  gradually improved from a respiratory standpoint, required transfusion on the 2nd for a slow drift in her hemoglobin. By June 2, the NG tube was  removed and she was allowed a few sips of liquids. She still had a  significantly elevated [place] count. Followup CT scan was done and there was  no evidence of any new fluid collection and her drains were in place. There  was some question of a fracture of the radiologically placed catheter. This  was discussed with Dr. Luan Pulling and she relayed that she had removed the  internal portion of the catheter at the time of surgery. By June 5, she was  showing continued improvement, allowed a clear liquid diet, ambulation was  begun and she was transferred to step down status. She did have some  problems with diarrhea, multiple stools for C Dif were obtained all of which  were negative. The decision was made to treat her empirically with Flagyl  and Reglan was discontinued. We were gradually thereafter able to advance  her diet. She was very anxious to be discharged from the hospital after a  prolonged  hospitalization. She was eating solid food by the 7th, her JP's  were removed on June 8 per Dr. Daphine Deutscher and she was felt to be stable for  discharge on the 9th. Plans were made for a followup CT scan on June 12 at  9:30 a.m. at [facility]. She was discharged on Cipro 500 mg b.i.d., Flagyl  250 q.i.d. x7 days and may need additional antibiotics. Protonix 40 mg  q.a.m., Vicodin 5/500, 1 p.o. q.6 h p.r.n., K-Dur 20 mEq b.i.d. x4 days and  Mycostatin oral suspension 5 mL q.i.d. She was hypokalemic on the day of  discharge with a potassium of 3 and was given potassium runs prior to  discharge. This will need to be followed up on an outpatient basis as well.  She has office followup with Dr. Arlyce Dice, Wednesday June 14 at 11:30 a.m. She  was advised to call for any problems in the interim with increased abdominal  pain, fever, nausea, vomiting, etc. She also has followup with Dr. Daphine Deutscher in  Dr. Wyonia Hough absence on Friday, June 16 at 3:50 p.m. for wound check.  She will have home health nurse come to the home for dry dressing changes on  a daily basis in the interim.   CONDITION ON DISCHARGE:  Improved.       AE/MEDQ  D:  12/17/2004  T:  12/17/2004  Job:  [id number]   cc:   Vikki Ports, MD  [address]  [place]  [place] [address]

## 2010-11-26 NOTE — H&P (Signed)
NAME:  Danielle [name], Danielle [name]                 ACCOUNT NO.:  [id number]   MEDICAL RECORD NO.:  [id number]          PATIENT TYPE:  INP   LOCATION:  0252                         FACILITY:  [facility]   PHYSICIAN:  Wilhemina Bonito. Marina Goodell, M.D. [facility] OF BIRTH:  1956/06/27   DATE OF ADMISSION:  11/30/2004  DATE OF DISCHARGE:                                HISTORY & PHYSICAL   CHIEF COMPLAINT:  Acute severe abdominal pain, post-endoscopic retrograde  cholangiopancreatography.   HISTORY:  Katrinna is a pleasant 55 year old [place] female known to Dr. Russella Dar  who has a history of GERD and also choledocholithiasis.  She is status post  remote cholecystectomy in 1993 and had undergone ERCP with Dr. Russella Dar in  March 2006 at which time she was found to have a common bile duct stone with  one stone removed and then stent placement with 4-5 residual stones unable  to be retrieved.  She did well after that procedure and was then scheduled  on Nov 30, 2004, actually with Dr. Arlyce Dice to undergo repeat ERCP stent  retrieval and stone extraction.  She tolerated the procedure well.  She had  4-5 discrete fairly large stones measuring 10-15 mm removed with a balloon  and the stent was removed.  Sphincterotomy was done as well.  She felt fine  after the procedure and after observation was allowed discharge to home.  However on her way home, she developed some mild epigastric pain which then  progressed through the afternoon.  She called the office at about 5:15 and  stated that she was having pain about 6/10 and was advised that she may need  to be admitted to the hospital for pain control and probable pancreatitis.  At that time, she wanted to try to stay home, called in some Vicodin and  asked to stay on clear liquids and advised that should her progress she may  come to the emergency room for evaluation.  Apparently around 9 p.m., the  pain had progressed to the point that she could not tolerate this at home  and came in through the  emergency room and was seen and evaluated by Dr.  Marina Goodell.  She was found to be hemodynamically stable but with evidence for  acute post-ERCP pancreatitis.  She is admitted for pain control, bowel rest  and IV fluid volume replacement and will be covered with IV antibiotics.   CURRENT MEDICATIONS:  Protonix 40 mg daily.   ALLERGIES:  Sulfa drugs.   PAST MEDICAL HISTORY:  Pertinent for remote cholecystectomy in 1993, ERCP  and stent placement in March 2006 with one stone in the common bile duct  removed.  Status post hysterectomy.   FAMILY HISTORY:  Negative for GI disease.   SOCIAL HISTORY:  The patient is married.  She is a nonsmoker, drinks alcohol  socially.   REVIEW OF SYSTEMS:  CARDIOVASCULAR:  Denied any chest pain or anginal  symptoms.  PULMONARY:  Denies any cough, shortness of breath or sputum production.  GENITOURINARY:  Denies currently.  GI:  As above.   PHYSICAL EXAMINATION:  GENERAL:  Per Dr.  Marina Goodell, well-developed [place] female  uncomfortable secondary to abdominal pain, alert and oriented x3.  Temperature on admission is 98.9, blood pressure 113/77, pulse 76,  respirations 20.  HEENT:  Nontraumatic, normocephalic, Pupils equal round and reactive to  light and accommodation;  Sclerae is anicteric.  NECK:  Supple without nodes.  CARDIOVASCULAR:  Regular rate and rhythm with S1, S2.  PULMONARY:  Clear to A&P.  ABDOMEN:  Soft.  She is tender across the upper abdomen and especially in  the epigastrium.  Bowel sounds are present but hypoactive.  RECTAL:  Not done at this time.  EXTREMITIES:  Without clubbing, cyanosis or edema.  NEUROLOGIC:  Grossly nonfocal.   LABORATORY DATA:  On admission, WBC 18.9, hemoglobin 13.7, hematocrit 40.1,  potassium 142.  Sodium 142, potassium 3.6, glucose 145, bilirubin 1.4.  Alk  phos 196, SGOT 202, SGPT 86.  Amylase 533, lipase 932.  KUB has a mild  increase in colonic gas.   IMPRESSION:  1.  A 55 year old [place] female with acute  post-ERCP pancreatitis +/-      retained common bile duct stones.  2.  Choledocholithiasis with multiple common bile duct stones, status post      ERCP stone extraction and stent placement on March 2006, and second      procedure today on the date of admission with multiple common bile duct      stones extracted and sphincterotomy as well as stent removal.  3.  Remote cholecystectomy.  4.  Hysterectomy.  5.  Gastroesophageal reflux disease.   PLAN:  The patient is admitted to the service of Dr. Claudette Head for IV  fluid hydration and pain control to be covered with IV Unasyn, kept n.p.o.  and will follow serial laboratories.  For further details, please see the  orders.      AE/MEDQ  D:  12/01/2004  T:  12/01/2004  Job:  [id number]

## 2010-11-26 NOTE — H&P (Signed)
NAME:  Danielle [name], Danielle [name]                 ACCOUNT NO.:  [id number]   MEDICAL RECORD NO.:  [id number]          PATIENT TYPE:  INP   LOCATION:  1429                         FACILITY:  [facility]   PHYSICIAN:  Lorre Munroe., M.D.DATE OF BIRTH:  [birth year]   DATE OF ADMISSION:  01/05/2005  DATE OF DISCHARGE:                                HISTORY & PHYSICAL   CHIEF COMPLAINT:  Fever and abdominal pain.   HISTORY OF PRESENT ILLNESS:  Danielle [name] is a 55 year old [place] female who has  had a lot of trouble following ERCP for common duct stones in late May of  this year. This was a second ERCP done for this problem, the first being  done in March. A stent had been in place in the interim. She developed  pancreatitis and possibly a duodenal perforation following this and  underwent exploratory laparotomy, duodenal repair and drainage by Dr.  Luan Pulling on May 30. She went home on June 9. She had had some diarrhea and  some problem with low potassium prior to her discharge. She has continued to  have some diarrhea. She had been followed up in the office with CT scans  showing some abnormalities of the wound and the lower anterior abdomen and  the right lower quadrant area where infection had been present previously.  Today she had fever and was found to have a tender mass in the lower abdomen  and I saw her in the emergency room and brought her in for reevaluation. She  also has a potassium of 2.2 presumably due to her diarrhea. She has not been  on any diuretics or any potassium replacement. The patient has not noticed  jaundice, dark urine, light stools. She had her cholecystectomy done in  1993. Danielle [name] count in the emergency department was 16,900.   PAST MEDICAL HISTORY:  The patient has had a hysterectomy. No other  abdominal operations except those mentioned above. She is on Protonix and  Diflucan. I believe the Diflucan was for a yeast urinary tract flexion. She  is allergic to SULFA DRUGS. She  denies heart disease, lung disease and other  serious chronic ailments.   SOCIAL HISTORY:  Nonsmoker, a social drinker.   REVIEW OF SYSTEMS:  Except as mentioned above was negative.   PHYSICAL EXAM:  VITAL SIGNS:  Temperature was 100 and vital signs  unremarkable except for mild tachycardia as measured by the nursing staff.  GENERAL:  No acute distress.  HEENT:  Head, neck times ears, nose, throat unremarkable with no enlargement  of thyroid. No thyroid mass. Mucous membranes moist. No neck mass.  CHEST:  Clear to auscultation. No chest wall tenderness.  HEART:  Rate rapid, rhythm regular. No murmur or gallop.  BREASTS:  No masses or skin change.  ABDOMEN:  Slightly tender in the upper abdomen but soft. Good bowel sounds.  Quite tender mass in the lower midline about 7-8 cm in size. No skin redness  or edema present. There is no wound drainage noted. No other masses were  noted.  RECTAL/PELVIC:  Not done at this time.  EXTREMITIES:  No edema. Good pulses. No skin problems. NEUROLOGICAL:  Grossly normal.  LYMPH NODES:  Not enlarged in the neck, axilla or groin.   IMPRESSION:  Abdominal pain and fever, possible deep subcutaneous and  possible intra-abdominal abscesses.   PLAN:  Her last CT was about a week ago and I reviewed that with Dr. Purcell Mouton.  We will repeat her scan with p.o. contrast to be sure there is no evidence  of fistula. Start her on Primaxin. I am correcting her hypokalemia. We will  put her on telemetry and will watch her carefully. I will notify the [facility] that she is readmitted.       WB/MEDQ  D:  01/05/2005  T:  01/05/2005  Job:  [id number]

## 2010-11-26 NOTE — Op Note (Signed)
NAME:  [name], Danielle                 ACCOUNT NO.:  [id number]   MEDICAL RECORD NO.:  [id number]          PATIENT TYPE:  INP   LOCATION:  1417                         [facility]ACILITY:  [facility]   PHYSICIAN:  Vikki Ports, MDDATE OF BIRTH:  Oct 12, 1955   DATE OF PROCEDURE:  02/03/2005  DATE OF DISCHARGE:                                 OPERATIVE REPORT   PREOPERATIVE DIAGNOSIS:  Right retroperitoneal abscesses.   POSTOPERATIVE DIAGNOSIS:  Right retroperitoneal abscesses.   PROCEDURE:  Incision and drainage and packing of right retroperitoneal  abscess x2.   ANESTHESIA:  General.   SURGEON:  Vikki Ports, M.D.   DESCRIPTION OF PROCEDURE:  The patient was taken to the operating room,  placed in the supine position. After adequate general anesthesia was  induced, the patient was placed in the prone position. The right flank was  prepped and draped in the normal sterile fashion. Using the previously  placed percutaneous drains, I made incisions over both sites, followed them  down to approximately 7-10 cm in depth and encountered loculated fluid  collections which were manually broken up and aspirated. Cultures were taken  and the wounds were copiously irrigated with pulse lavage. A Penrose drain  was placed and all cavities sutured to the skin with 2-0 silk sutures.  Sterile dressings were applied. The patient tolerated the procedure well and  went to PACU in good condition.       KRH/MEDQ  D:  02/03/2005  T:  02/03/2005  Job:  [id number]

## 2010-11-26 NOTE — Op Note (Signed)
NAME:  Danielle [name], Danielle [name]                 ACCOUNT NO.:  [id number]   MEDICAL RECORD NO.:  [id number]           PATIENT TYPE:   LOCATION:                                 FACILITY:   PHYSICIAN:  Alfonse Ras, MD        DATE OF BIRTH:   DATE OF PROCEDURE:  08/08/2005  DATE OF DISCHARGE:                                 OPERATIVE REPORT   PREOPERATIVE DIAGNOSIS:  Ventral hernia.   POSTOPERATIVE DIAGNOSIS:  Ventral hernia.   PROCEDURE:  Ventral hernia repair with mesh.   SURGEON:  Alfonse Ras, MD   ANESTHESIA:  General.   DESCRIPTION:  The patient was taken to the operating room, placed in the  supine position after adequate general anesthesia was induced using  endotracheal tube; the abdomen was prepped and draped in the normal sterile  fashion. Using an elliptical incision around the previous widened scar in  the upper abdomen, I excised the skin and subcutaneous tissue. The abdomen  was then entered; and the edges of fascia in all directions were freed up  and flaps were made anterior to it. The defect was closed, after adhesions  were taken down from the anterior abdominal wall. It was closed with  interrupted #1 Novofil's. A piece of proceed mesh was then placed over the  repair and tacked in the periphery using a running 2-0 Prolene suture. A 19-  Blake drain was placed over the mesh in the subcutaneous space. The  subcutaneous tissue was closed with a running 3-0 Vicryl; and skin was  closed staples. The patient tolerated the procedure well and went to PACU in  good condition.      Alfonse Ras, MD  Electronically Signed     KRE/MEDQ  D:  08/08/2005  T:  08/09/2005  Job:  [id number]

## 2010-11-26 NOTE — Op Note (Signed)
NAME:  Danielle [name], Danielle [name]                 ACCOUNT NO.:  [id number]   MEDICAL RECORD NO.:  [id number]          PATIENT TYPE:  INP   LOCATION:  0153                         [facility]   PHYSICIAN:  Vikki Ports, MDDATE OF BIRTH:  1955/11/14   DATE OF PROCEDURE:  12/07/2004  DATE OF DISCHARGE:                                 OPERATIVE REPORT   PREOPERATIVE DIAGNOSIS:  Intra-abdominal sepsis.   POSTOPERATIVE DIAGNOSIS:  1.  Intra-abdominal sepsis.  2.  Intra-abdominal abscess.  3.  Probable contained duodenal perforation.   PROCEDURES:  Exploratory laparotomy and drainage of intra-abdominal abscess.  Pulse lavage of the retroperitoneum and Tisseal reinforcement of the lateral  wall of the duodenum.  Duodenorrhaphy.   SURGEON:  Vikki Ports, MD   ASSISTANT:  Thornton Park. Daphine Deutscher, MD   ANESTHESIA:  General.   DESCRIPTION OF PROCEDURE:  The patient was taken to the operating room and  placed in the supine position.  After adequate general anesthesia was  induced using endotracheal tube, the abdomen was prepped and draped in the  normal sterile fashion.  Using a vertical midline incision, I dissected down  to the fascia.  The fascia was opened vertically.  There was some murky free  fluid on entering the peritoneum, which was suctioned out.  There was  significant amount of exudate and fluid in the peritoneum.  CT findings have  shown significant retroperitoneal edema, and therefore the right colon was  mobilized; large area of edema and fluid collection in the right pericolic  gutter and retroperitoneum was aspirated.   I then turned my attention to the upper GI tract.  The duodenum was adhered  to the liver with exudate, and this was mobilized.  The lateral portion of  the duodenum was mobilized.  Small venous bleeding was controlled with  suture ligatures.  There was significant amount of exudate on the posterior  part of the second portion of the duodenum,  consistent with a contained  perforation.  The stomach was instilled with 300 cc of methylene blue, until  I got significant distention of that in the duodenum.  There was no evidence  of ongoing leak.  The exudate was meticulously removed and there was still  no evidence of leak.  It was reinforced with 5 cc of the Tisseal.  The  abdomen was then pulse lavaged and copiously irrigated.  Adequate hemostasis  was ensured.  Drains were placed near the lateral portion of the duodenum  x2.  The fascia was closed with running #1 Novofil and interrupted #5  Ethibond retention sutures.  The skin was closed with interrupted staples  and packed with wicks.  I also placed in vertical mattress 3-0 nylon sutures  for a delayed primary closure.  Sterile dressing was applied.  The patient  tolerated the procedure well and went to the intensive care unit in serious  condition.       KRH/MEDQ  D:  12/08/2004  T:  12/08/2004  Job:  [id number]

## 2010-11-26 NOTE — Assessment & Plan Note (Signed)
[facility]                                 ON-CALL NOTE   NAME:[name], Danielle                          MRN:          [id number]  DATE:09/23/2007                            DOB:          26-Jan-1956    PHONE NUMBER:  [phone].   PRIMARY:  [place].   SUBJECTIVE:  Danielle [name] is calling in regards to following up with her  call yesterday.  She had been exposed to the Flu virus.  She is now  having some mild congestion in her nose as well as her chest.  She  denies fever, chills, body aches.  The symptoms have come on gradually  and not suddenly.  She has no shortness of breath.   ASSESSMENT AND PLAN:  I discussed with the patient that this may be a  run-of-the-mill viral URI as opposed to Influenza A.  Given her  exposure, I offered to call in Relenza treatment, given that she is  within the 48 hours of her symptoms.  She still states she cannot afford  this medication at this time.  Instead, she will use symptomatic care  which was discussed.     Kerby Nora, MD  Electronically Signed    AB/MedQ  DD: 09/23/2007  DT: 09/23/2007  Job #: [id number]

## 2011-01-18 ENCOUNTER — Ambulatory Visit: Payer: Medicare Other | Admitting: Internal Medicine

## 2011-03-05 ENCOUNTER — Other Ambulatory Visit: Payer: Self-pay | Admitting: Internal Medicine

## 2011-03-07 ENCOUNTER — Telehealth: Payer: Self-pay | Admitting: *Deleted

## 2011-03-07 NOTE — Telephone Encounter (Signed)
Last OV in May for a different issue - needs OV pls Thx

## 2011-03-07 NOTE — Telephone Encounter (Signed)
Pt says she was seen recently for psoriasis in the back of there head. RX given did not help and she is req alt med from MD to help w/itching.

## 2011-03-08 NOTE — Telephone Encounter (Signed)
Patient informed, she already has apt 9/7, offered earlier OV, pt declined

## 2011-03-18 ENCOUNTER — Other Ambulatory Visit (INDEPENDENT_AMBULATORY_CARE_PROVIDER_SITE_OTHER): Payer: Medicare Other

## 2011-03-18 ENCOUNTER — Encounter: Payer: Self-pay | Admitting: Internal Medicine

## 2011-03-18 ENCOUNTER — Ambulatory Visit (INDEPENDENT_AMBULATORY_CARE_PROVIDER_SITE_OTHER): Payer: Medicare Other | Admitting: Internal Medicine

## 2011-03-18 VITALS — BP 100/72 | HR 76 | Temp 98.4°F | Resp 16 | Wt 149.0 lb

## 2011-03-18 DIAGNOSIS — E669 Obesity, unspecified: Secondary | ICD-10-CM

## 2011-03-18 DIAGNOSIS — I10 Essential (primary) hypertension: Secondary | ICD-10-CM

## 2011-03-18 DIAGNOSIS — R7309 Other abnormal glucose: Secondary | ICD-10-CM

## 2011-03-18 DIAGNOSIS — Z23 Encounter for immunization: Secondary | ICD-10-CM

## 2011-03-18 DIAGNOSIS — R739 Hyperglycemia, unspecified: Secondary | ICD-10-CM

## 2011-03-18 DIAGNOSIS — K219 Gastro-esophageal reflux disease without esophagitis: Secondary | ICD-10-CM

## 2011-03-18 DIAGNOSIS — E876 Hypokalemia: Secondary | ICD-10-CM

## 2011-03-18 LAB — GLUCOSE, POCT (MANUAL RESULT ENTRY): POC Glucose: 170

## 2011-03-18 LAB — COMPREHENSIVE METABOLIC PANEL
ALT: 23 U/L (ref 0–35)
AST: 27 U/L (ref 0–37)
Albumin: 3.9 g/dL (ref 3.5–5.2)
Alkaline Phosphatase: 94 U/L (ref 39–117)
BUN: 15 mg/dL (ref 6–23)
CO2: 24 mEq/L (ref 19–32)
Calcium: 9.3 mg/dL (ref 8.4–10.5)
Chloride: 106 mEq/L (ref 96–112)
Creatinine, Ser: 0.8 mg/dL (ref 0.4–1.2)
GFR: 79.43 mL/min (ref >60.00)
Glucose, Bld: 138 mg/dL — ABNORMAL HIGH (ref 70–99)
Potassium: 3.2 mEq/L — ABNORMAL LOW (ref 3.5–5.1)
Sodium: 140 mEq/L (ref 135–145)
Total Bilirubin: 0.6 mg/dL (ref 0.3–1.2)
Total Protein: 7.2 g/dL (ref 6.0–8.3)

## 2011-03-18 LAB — TSH: TSH: 1.16 u[IU]/mL (ref 0.35–5.50)

## 2011-03-18 LAB — HEMOGLOBIN A1C: Hgb A1c MFr Bld: 5.7 % (ref 4.6–6.5)

## 2011-03-18 MED ORDER — MOMETASONE FUROATE 0.1 % EX SOLN: CUTANEOUS | Status: DC

## 2011-03-18 NOTE — Assessment & Plan Note (Signed)
Continue with current prescription therapy as reflected on the Med list.  

## 2011-03-18 NOTE — Progress Notes (Signed)
  Subjective:    Patient ID: Danielle [name], female    DOB: 1956/04/09, 56 y.o.   MRN: [id number]  HPI  F/u HTN, depression, elev. glucose C/o rash on scalp, chronic  Review of Systems  Constitutional: Negative for chills, activity change, appetite change, fatigue and unexpected weight change.  HENT: Negative for congestion, mouth sores and sinus pressure.   Eyes: Negative for visual disturbance.  Respiratory: Negative for cough and chest tightness.   Gastrointestinal: Negative for nausea and abdominal pain.  Genitourinary: Negative for frequency, difficulty urinating and vaginal pain.  Musculoskeletal: Negative for back pain and gait problem.  Skin: Positive for rash (on scalp). Negative for pallor.  Neurological: Negative for dizziness, tremors, weakness, numbness and headaches.  Psychiatric/Behavioral: Negative for confusion and sleep disturbance.       Objective:   Physical Exam  Constitutional: She appears well-developed. No distress.       Obese   HENT:  Head: Normocephalic.  Right Ear: External ear normal.  Left Ear: External ear normal.  Nose: Nose normal.  Mouth/Throat: Oropharynx is clear and moist.  Eyes: Conjunctivae are normal. Pupils are equal, round, and reactive to light. Right eye exhibits no discharge. Left eye exhibits no discharge.  Neck: Normal range of motion. Neck supple. No JVD present. No tracheal deviation present. No thyromegaly present.  Cardiovascular: Normal rate, regular rhythm and normal heart sounds.   Pulmonary/Chest: No stridor. No respiratory distress. She has no wheezes.  Abdominal: Soft. Bowel sounds are normal. She exhibits no distension and no mass. There is no tenderness. There is no rebound and no guarding.  Musculoskeletal: She exhibits no edema and no tenderness.  Lymphadenopathy:    She has no cervical adenopathy.  Neurological: She displays normal reflexes. No cranial nerve deficit. She exhibits normal muscle tone. Coordination  normal.  Skin: Rash (patches on scalp) noted. No erythema.  Psychiatric: She has a normal mood and affect. Her behavior is normal. Judgment and thought content normal.          Assessment & Plan:

## 2011-03-18 NOTE — Patient Instructions (Signed)
Wt Readings from Last 3 Encounters:  03/18/11 149 lb (67.586 kg)  11/16/10 146 lb 6 oz (66.395 kg)  09/10/10 154 lb (69.854 kg)

## 2011-03-19 DIAGNOSIS — E876 Hypokalemia: Secondary | ICD-10-CM | POA: Insufficient documentation

## 2011-03-19 NOTE — Assessment & Plan Note (Signed)
Diet discussed 

## 2011-03-19 NOTE — Assessment & Plan Note (Signed)
2011 - glucose intolerance Will watch - labs ok

## 2011-03-19 NOTE — Assessment & Plan Note (Signed)
Will make sure she is taking KCl

## 2011-03-21 ENCOUNTER — Telehealth: Payer: Self-pay | Admitting: *Deleted

## 2011-03-21 NOTE — Telephone Encounter (Signed)
Left mess to call office back.   

## 2011-03-21 NOTE — Telephone Encounter (Signed)
Message copied by Vernie Murders on Mon Mar 21, 2011  4:11 PM ------      Message from: Janeal Holmes      Created: Sat Mar 19, 2011  1:04 PM       Misty Stanley, please, inform patient that all labs are normal except for low potassium. Is she taking KCl? If not restart. If taking - double up the dose.            Pls close the chart            Thx

## 2011-03-22 ENCOUNTER — Other Ambulatory Visit: Payer: Self-pay | Admitting: *Deleted

## 2011-03-22 MED ORDER — POTASSIUM CHLORIDE ER 10 MEQ PO TBCR: 10.0000 meq | EXTENDED_RELEASE_TABLET | Freq: Two times a day (BID) | ORAL | Status: DC

## 2011-03-22 NOTE — Telephone Encounter (Signed)
Patient informed, she is not taking potassium RX sent in

## 2011-04-11 ENCOUNTER — Encounter: Payer: Self-pay | Admitting: Internal Medicine

## 2011-05-31 ENCOUNTER — Other Ambulatory Visit: Payer: Self-pay | Admitting: Internal Medicine

## 2011-06-17 ENCOUNTER — Ambulatory Visit: Payer: Medicare Other | Admitting: Internal Medicine

## 2011-07-12 ENCOUNTER — Other Ambulatory Visit: Payer: Self-pay | Admitting: Internal Medicine

## 2011-07-21 ENCOUNTER — Ambulatory Visit: Payer: Medicare Other | Admitting: Internal Medicine

## 2011-07-25 ENCOUNTER — Other Ambulatory Visit: Payer: Self-pay | Admitting: Internal Medicine

## 2011-08-15 ENCOUNTER — Telehealth: Payer: Self-pay | Admitting: *Deleted

## 2011-08-15 NOTE — Telephone Encounter (Signed)
Pt called back stating her insurance company suggests Irbesartan as an alternative

## 2011-08-15 NOTE — Telephone Encounter (Signed)
Pt left vm req callback about her BP med. I called pt-Left mess for patient to call back.

## 2011-08-17 NOTE — Telephone Encounter (Signed)
Please advise- ok to change current blood pressure med to Irbesartan?

## 2011-08-19 MED ORDER — LOSARTAN POTASSIUM 100 MG PO TABS: 100.0000 mg | ORAL_TABLET | Freq: Every day | ORAL | Status: DC

## 2011-08-19 NOTE — Telephone Encounter (Signed)
We can try but it is weaker than [place] Thx

## 2011-08-19 NOTE — Telephone Encounter (Signed)
Rx sent by [facility]- tried to advise pt- no answer/machine

## 2011-08-19 NOTE — Telephone Encounter (Signed)
Please advise- what strength/sig?

## 2011-08-20 NOTE — Telephone Encounter (Signed)
Tried calling pt again- no answer/machine.

## 2011-08-23 NOTE — Telephone Encounter (Signed)
Pt informed

## 2011-08-31 ENCOUNTER — Other Ambulatory Visit: Payer: Self-pay | Admitting: *Deleted

## 2011-08-31 ENCOUNTER — Other Ambulatory Visit (INDEPENDENT_AMBULATORY_CARE_PROVIDER_SITE_OTHER): Payer: Medicare Other

## 2011-08-31 DIAGNOSIS — R5383 Other fatigue: Secondary | ICD-10-CM

## 2011-08-31 DIAGNOSIS — R739 Hyperglycemia, unspecified: Secondary | ICD-10-CM

## 2011-08-31 DIAGNOSIS — R7309 Other abnormal glucose: Secondary | ICD-10-CM

## 2011-08-31 DIAGNOSIS — I10 Essential (primary) hypertension: Secondary | ICD-10-CM | POA: Diagnosis not present

## 2011-08-31 DIAGNOSIS — R5381 Other malaise: Secondary | ICD-10-CM | POA: Diagnosis not present

## 2011-08-31 LAB — COMPREHENSIVE METABOLIC PANEL
ALT: 18 U/L (ref 0–35)
AST: 17 U/L (ref 0–37)
Albumin: 4.2 g/dL (ref 3.5–5.2)
Alkaline Phosphatase: 92 U/L (ref 39–117)
BUN: 17 mg/dL (ref 6–23)
CO2: 25 mEq/L (ref 19–32)
Calcium: 9.7 mg/dL (ref 8.4–10.5)
Chloride: 107 mEq/L (ref 96–112)
Creatinine, Ser: 0.9 mg/dL (ref 0.4–1.2)
GFR: 71.03 mL/min (ref >60.00)
Glucose, Bld: 90 mg/dL (ref 70–99)
Potassium: 4.4 mEq/L (ref 3.5–5.1)
Sodium: 142 mEq/L (ref 135–145)
Total Bilirubin: 0.3 mg/dL (ref 0.3–1.2)
Total Protein: 7.5 g/dL (ref 6.0–8.3)

## 2011-08-31 LAB — HEMOGLOBIN A1C: Hgb A1c MFr Bld: 5.9 % (ref 4.6–6.5)

## 2011-08-31 LAB — TSH: TSH: 1.36 u[IU]/mL (ref 0.35–5.50)

## 2011-09-02 ENCOUNTER — Ambulatory Visit (INDEPENDENT_AMBULATORY_CARE_PROVIDER_SITE_OTHER): Payer: Medicare Other | Admitting: Internal Medicine

## 2011-09-02 ENCOUNTER — Encounter: Payer: Self-pay | Admitting: Internal Medicine

## 2011-09-02 VITALS — BP 130/98 | Temp 98.6°F | Wt 151.0 lb

## 2011-09-02 DIAGNOSIS — R7309 Other abnormal glucose: Secondary | ICD-10-CM

## 2011-09-02 DIAGNOSIS — E876 Hypokalemia: Secondary | ICD-10-CM | POA: Diagnosis not present

## 2011-09-02 DIAGNOSIS — I10 Essential (primary) hypertension: Secondary | ICD-10-CM | POA: Diagnosis not present

## 2011-09-02 DIAGNOSIS — F39 Unspecified mood [affective] disorder: Secondary | ICD-10-CM | POA: Diagnosis not present

## 2011-09-02 DIAGNOSIS — R739 Hyperglycemia, unspecified: Secondary | ICD-10-CM

## 2011-09-02 MED ORDER — VERAPAMIL HCL ER 180 MG PO TBCR: 180.0000 mg | EXTENDED_RELEASE_TABLET | Freq: Every day | ORAL | Status: DC

## 2011-09-02 NOTE — Progress Notes (Signed)
Patient ID: Danielle [name], female   DOB: 1956-04-09, 56 y.o.   MRN: [id number]  Subjective:    Patient ID: Danielle [name], female    DOB: [birth year], 56 y.o.   MRN: [id number]  HPI  F/u HTN, depression, elev. glucose C/o rash on scalp, chronic - better  Review of Systems  Constitutional: Negative for chills, activity change, appetite change, fatigue and unexpected weight change.  HENT: Negative for congestion, mouth sores and sinus pressure.   Eyes: Negative for visual disturbance.  Respiratory: Negative for cough and chest tightness.   Gastrointestinal: Negative for nausea and abdominal pain.  Genitourinary: Negative for frequency, difficulty urinating and vaginal pain.  Musculoskeletal: Negative for back pain and gait problem.  Skin: Positive for rash (on scalp). Negative for pallor.  Neurological: Negative for dizziness, tremors, weakness, numbness and headaches.  Psychiatric/Behavioral: Negative for confusion and sleep disturbance.   BP Readings from Last 3 Encounters:  09/02/11 130/98  03/18/11 100/72  11/16/10 110/80   Wt Readings from Last 3 Encounters:  09/02/11 151 lb (68.493 kg)  03/18/11 149 lb (67.586 kg)  11/16/10 146 lb 6 oz (66.395 kg)        Objective:   Physical Exam  Constitutional: She appears well-developed. No distress.       Obese   HENT:  Head: Normocephalic.  Right Ear: External ear normal.  Left Ear: External ear normal.  Nose: Nose normal.  Mouth/Throat: Oropharynx is clear and moist.  Eyes: Conjunctivae are normal. Pupils are equal, round, and reactive to light. Right eye exhibits no discharge. Left eye exhibits no discharge.  Neck: Normal range of motion. Neck supple. No JVD present. No tracheal deviation present. No thyromegaly present.  Cardiovascular: Normal rate, regular rhythm and normal heart sounds.   Pulmonary/Chest: No stridor. No respiratory distress. She has no wheezes.  Abdominal: Soft. Bowel sounds are normal. She exhibits no  distension and no mass. There is no tenderness. There is no rebound and no guarding.  Musculoskeletal: She exhibits no edema and no tenderness.  Lymphadenopathy:    She has no cervical adenopathy.  Neurological: She displays normal reflexes. No cranial nerve deficit. She exhibits normal muscle tone. Coordination normal.  Skin: Rash (patches on scalp) noted. No erythema.  Psychiatric: She has a normal mood and affect. Her behavior is normal. Judgment and thought content normal.    Lab Results  Component Value Date   WBC 9.9 03/31/2010   HGB 14.2 03/31/2010   HCT 41.1 03/31/2010   PLT 329.0 03/31/2010   GLUCOSE 90 08/31/2011   ALT 18 08/31/2011   AST 17 08/31/2011   NA 142 08/31/2011   K 4.4 08/31/2011   CL 107 08/31/2011   CREATININE 0.9 08/31/2011   BUN 17 08/31/2011   CO2 25 08/31/2011   TSH 1.36 08/31/2011   HGBA1C 5.9 08/31/2011         Assessment & Plan:

## 2011-09-02 NOTE — Assessment & Plan Note (Signed)
Continue with current prescription therapy as reflected on the Med list.  

## 2011-09-02 NOTE — Assessment & Plan Note (Signed)
Change to Verap SR

## 2011-09-02 NOTE — Assessment & Plan Note (Signed)
Off prozac - feeling better

## 2011-09-02 NOTE — Assessment & Plan Note (Signed)
Labs reviewed.

## 2011-09-04 ENCOUNTER — Encounter: Payer: Self-pay | Admitting: Internal Medicine

## 2011-09-29 ENCOUNTER — Telehealth: Payer: Self-pay

## 2011-09-29 MED ORDER — AMOXICILLIN 500 MG PO CAPS: 1000.0000 mg | ORAL_CAPSULE | Freq: Two times a day (BID) | ORAL | Status: DC

## 2011-09-29 NOTE — Telephone Encounter (Signed)
Ok Amoxicillin Thx 

## 2011-09-29 NOTE — Telephone Encounter (Signed)
Pt called c/o of sinus infection/cold x 1 week - HA, yellow nasal drainage, ST and productive cough. Pt is requesting Rx, please advise.

## 2011-09-29 NOTE — Telephone Encounter (Signed)
Pt informed of Rx/pharmacy 

## 2011-10-03 ENCOUNTER — Telehealth: Payer: Self-pay

## 2011-10-03 NOTE — Telephone Encounter (Signed)
Try Amoxicillin 1 po qid or tid Thx

## 2011-10-03 NOTE — Telephone Encounter (Signed)
Pt called stating that she D/C's Amoxicillin because it caused stomach pain. Pt is requesting advisement from MD.

## 2011-10-04 MED ORDER — CEFUROXIME AXETIL 250 MG PO TABS: 250.0000 mg | ORAL_TABLET | Freq: Two times a day (BID) | ORAL | Status: AC

## 2011-10-04 NOTE — Telephone Encounter (Signed)
Amoxicillin caused abdominal pain. Can she try another ABX?

## 2011-10-04 NOTE — Telephone Encounter (Signed)
Ok Ceftin OV if problems - any MD Thx

## 2011-10-05 NOTE — Telephone Encounter (Signed)
Pt advised of new ABX but declined to take it as she it feeling better.

## 2011-10-20 ENCOUNTER — Other Ambulatory Visit: Payer: Self-pay | Admitting: Internal Medicine

## 2011-10-22 ENCOUNTER — Other Ambulatory Visit: Payer: Self-pay | Admitting: Internal Medicine

## 2011-10-24 NOTE — Telephone Encounter (Signed)
Potassium refill previously sent to pharmacy on 10/20/11 #180 x 1 refill. Denial sent to pharmacy and to check refills on file.

## 2011-11-30 ENCOUNTER — Ambulatory Visit (INDEPENDENT_AMBULATORY_CARE_PROVIDER_SITE_OTHER): Payer: Medicare Other | Admitting: Internal Medicine

## 2011-11-30 ENCOUNTER — Encounter: Payer: Self-pay | Admitting: Internal Medicine

## 2011-11-30 VITALS — BP 110/80 | HR 72 | Temp 98.6°F | Resp 16 | Wt 153.0 lb

## 2011-11-30 DIAGNOSIS — E669 Obesity, unspecified: Secondary | ICD-10-CM | POA: Diagnosis not present

## 2011-11-30 DIAGNOSIS — I10 Essential (primary) hypertension: Secondary | ICD-10-CM

## 2011-11-30 DIAGNOSIS — R7309 Other abnormal glucose: Secondary | ICD-10-CM

## 2011-11-30 DIAGNOSIS — R739 Hyperglycemia, unspecified: Secondary | ICD-10-CM

## 2011-11-30 DIAGNOSIS — K219 Gastro-esophageal reflux disease without esophagitis: Secondary | ICD-10-CM

## 2011-11-30 NOTE — Assessment & Plan Note (Signed)
Continue with current prescription therapy as reflected on the Med list.  

## 2011-11-30 NOTE — Progress Notes (Signed)
Patient ID: Danielle [name], female   DOB: 06/22/1956, 56 y.o.   MRN: [id number] Patient ID: Danielle [name], female   DOB: [birth year], 56 y.o.   MRN: [id number]  Subjective:    Patient ID: Danielle [name], female    DOB: 26-Dec-1955, 56 y.o.   MRN: [id number]  HPI  F/u HTN, depression, elev. glucose C/o rash on scalp, chronic - better  Review of Systems  Constitutional: Negative for chills, activity change, appetite change, fatigue and unexpected weight change.  HENT: Negative for congestion, mouth sores and sinus pressure.   Eyes: Negative for visual disturbance.  Respiratory: Negative for cough and chest tightness.   Gastrointestinal: Negative for nausea.  Genitourinary: Negative for frequency, difficulty urinating and vaginal pain.  Musculoskeletal: Negative for back pain and gait problem.  Skin: Negative for pallor.  Neurological: Negative for dizziness, tremors, weakness and numbness.  Psychiatric/Behavioral: Negative for confusion and sleep disturbance.   BP Readings from Last 3 Encounters:  11/30/11 110/80  09/02/11 130/98  03/18/11 100/72   Wt Readings from Last 3 Encounters:  11/30/11 153 lb (69.4 kg)  09/02/11 151 lb (68.493 kg)  03/18/11 149 lb (67.586 kg)        Objective:   Physical Exam  Constitutional: She appears well-developed. No distress.       Obese   HENT:  Head: Normocephalic.  Right Ear: External ear normal.  Left Ear: External ear normal.  Nose: Nose normal.  Mouth/Throat: Oropharynx is clear and moist.  Eyes: Conjunctivae are normal. Pupils are equal, round, and reactive to light. Right eye exhibits no discharge. Left eye exhibits no discharge.  Neck: Normal range of motion. Neck supple. No JVD present. No tracheal deviation present. No thyromegaly present.  Cardiovascular: Normal rate, regular rhythm and normal heart sounds.   Pulmonary/Chest: No stridor. No respiratory distress. She has no wheezes.  Abdominal: Soft. Bowel sounds are normal. She  exhibits no distension and no mass. There is no tenderness. There is no rebound and no guarding.  Musculoskeletal: She exhibits no edema and no tenderness.  Lymphadenopathy:    She has no cervical adenopathy.  Neurological: She displays normal reflexes. No cranial nerve deficit. She exhibits normal muscle tone. Coordination normal.  Skin: No rash noted. No erythema.  Psychiatric: She has a normal mood and affect. Her behavior is normal. Judgment and thought content normal.    Lab Results  Component Value Date   WBC 9.9 03/31/2010   HGB 14.2 03/31/2010   HCT 41.1 03/31/2010   PLT 329.0 03/31/2010   GLUCOSE 90 08/31/2011   ALT 18 08/31/2011   AST 17 08/31/2011   NA 142 08/31/2011   K 4.4 08/31/2011   CL 107 08/31/2011   CREATININE 0.9 08/31/2011   BUN 17 08/31/2011   CO2 25 08/31/2011   TSH 1.36 08/31/2011   HGBA1C 5.9 08/31/2011         Assessment & Plan:

## 2011-11-30 NOTE — Patient Instructions (Signed)
BP Readings from Last 3 Encounters:  11/30/11 110/80  09/02/11 130/98  03/18/11 100/72   Wt Readings from Last 3 Encounters:  11/30/11 153 lb (69.4 kg)  09/02/11 151 lb (68.493 kg)  03/18/11 149 lb (67.586 kg)

## 2011-12-01 ENCOUNTER — Other Ambulatory Visit: Payer: Self-pay

## 2011-12-01 NOTE — Telephone Encounter (Signed)
Pt called requesting refill of an expired medication - triamcinolone Eucerin cream. Please advise.

## 2011-12-02 ENCOUNTER — Encounter: Payer: Self-pay | Admitting: Internal Medicine

## 2011-12-06 ENCOUNTER — Other Ambulatory Visit: Payer: Self-pay

## 2011-12-06 MED ORDER — TRIAMCINOLONE 0.1 % CREAM:EUCERIN CREAM 1:1: 1.0000 "application " | TOPICAL_CREAM | Freq: Two times a day (BID) | CUTANEOUS | Status: DC | PRN

## 2011-12-06 NOTE — Telephone Encounter (Signed)
A user error has taken place: encounter opened in error, closed for administrative reasons.

## 2011-12-07 ENCOUNTER — Telehealth: Payer: Self-pay | Admitting: *Deleted

## 2011-12-07 DIAGNOSIS — Z0000 Encounter for general adult medical examination without abnormal findings: Secondary | ICD-10-CM

## 2011-12-07 NOTE — Telephone Encounter (Signed)
Message copied by Merrilyn Puma on Wed Dec 07, 2011  9:43 AM ------      Message from: Etheleen Sia      Created: Wed Nov 30, 2011  4:14 PM      Regarding: PHYSICAL LABS       NOV 22 PHYSICAL

## 2011-12-07 NOTE — Telephone Encounter (Signed)
Pt is aware.  

## 2011-12-07 NOTE — Telephone Encounter (Signed)
Pt has Medicare. Labs can not be done prior to CPE.

## 2012-01-22 ENCOUNTER — Other Ambulatory Visit: Payer: Self-pay | Admitting: Internal Medicine

## 2012-01-25 ENCOUNTER — Ambulatory Visit (INDEPENDENT_AMBULATORY_CARE_PROVIDER_SITE_OTHER): Payer: Medicare Other | Admitting: Internal Medicine

## 2012-01-25 ENCOUNTER — Other Ambulatory Visit: Payer: Self-pay | Admitting: Internal Medicine

## 2012-01-25 VITALS — BP 130/90 | HR 80 | Temp 97.8°F | Resp 16 | Wt 151.0 lb

## 2012-01-25 DIAGNOSIS — D485 Neoplasm of uncertain behavior of skin: Secondary | ICD-10-CM | POA: Diagnosis not present

## 2012-01-25 DIAGNOSIS — D489 Neoplasm of uncertain behavior, unspecified: Secondary | ICD-10-CM

## 2012-01-25 DIAGNOSIS — I10 Essential (primary) hypertension: Secondary | ICD-10-CM | POA: Diagnosis not present

## 2012-01-25 NOTE — Assessment & Plan Note (Signed)
Options discussed Skin bx

## 2012-01-25 NOTE — Assessment & Plan Note (Signed)
Continue with current prescription therapy as reflected on the Med list.  

## 2012-01-25 NOTE — Progress Notes (Signed)
Subjective:    Patient ID: Danielle [name], female    DOB: 10-30-1955, 56 y.o.   MRN: [id number]  HPI C/o post L elbow new growth - dtr just noticed  F/u HTN, depression, elev. glucose   Review of Systems  Constitutional: Negative for chills, activity change, appetite change, fatigue and unexpected weight change.  HENT: Negative for congestion, mouth sores and sinus pressure.   Eyes: Negative for visual disturbance.  Respiratory: Negative for cough and chest tightness.   Gastrointestinal: Negative for nausea.  Genitourinary: Negative for frequency, difficulty urinating and vaginal pain.  Musculoskeletal: Negative for back pain and gait problem.  Skin: Negative for pallor.  Neurological: Negative for dizziness, tremors, weakness and numbness.  Psychiatric/Behavioral: Negative for confusion and disturbed wake/sleep cycle.   BP Readings from Last 3 Encounters:  01/25/12 130/90  11/30/11 110/80  09/02/11 130/98   Wt Readings from Last 3 Encounters:  01/25/12 151 lb (68.493 kg)  11/30/11 153 lb (69.4 kg)  09/02/11 151 lb (68.493 kg)        Objective:   Physical Exam  Constitutional: She appears well-developed. No distress.       Obese   HENT:  Head: Normocephalic.  Right Ear: External ear normal.  Left Ear: External ear normal.  Nose: Nose normal.  Mouth/Throat: Oropharynx is clear and moist.  Eyes: Conjunctivae are normal. Pupils are equal, round, and reactive to light. Right eye exhibits no discharge. Left eye exhibits no discharge.  Neck: Normal range of motion. Neck supple. No JVD present. No tracheal deviation present. No thyromegaly present.  Cardiovascular: Normal rate, regular rhythm and normal heart sounds.   Pulmonary/Chest: No stridor. No respiratory distress. She has no wheezes.  Abdominal: Soft. Bowel sounds are normal. She exhibits no distension and no mass. There is no tenderness. There is no rebound and no guarding.  Musculoskeletal: She exhibits no edema  and no tenderness.  Lymphadenopathy:    She has no cervical adenopathy.  Neurological: She displays normal reflexes. No cranial nerve deficit. She exhibits normal muscle tone. Coordination normal.  Skin: No rash noted. No erythema.  Psychiatric: She has a normal mood and affect. Her behavior is normal. Judgment and thought content normal.  Oval brown dry raised mole 4.3x2.1 cm w/dark hairs L post prox forearm  Lab Results  Component Value Date   WBC 9.9 03/31/2010   HGB 14.2 03/31/2010   HCT 41.1 03/31/2010   PLT 329.0 03/31/2010   GLUCOSE 90 08/31/2011   ALT 18 08/31/2011   AST 17 08/31/2011   NA 142 08/31/2011   K 4.4 08/31/2011   CL 107 08/31/2011   CREATININE 0.9 08/31/2011   BUN 17 08/31/2011   CO2 25 08/31/2011   TSH 1.36 08/31/2011   HGBA1C 5.9 08/31/2011    Procedure Note :     Procedure :  Skin biopsy   Indication:  Changing mole on L prox post forearm   Risks including unsuccessful procedure , bleeding, infection, bruising, scar, a need for another complete procedure and others were explained to the patient in detail as well as the benefits. Informed consent was obtained and signed.   The patient was placed in a decubitus position.  Lesion #1 on  L prox post forearm   measuring  4.3x2.1 cm   Skin over lesion #1  was prepped with Betadine and alcohol  and anesthetized with 1 cc of 2% lidocaine and epinephrine, using a 25-gauge 1 inch needle.  Shave biopsy of the radial  aspect of the mole 11x6 mm in size with a sterile Dermablade was carried out in the usual fashion. Hyfrecator was used for hemostasis. Band-Aid was applied with antibiotic ointment.  Tolerated well. Compl none.         Assessment & Plan:

## 2012-01-25 NOTE — Addendum Note (Signed)
Addended by: Merrilyn Puma on: 01/25/2012 02:35 PM   Modules accepted: Orders

## 2012-01-27 ENCOUNTER — Telehealth: Payer: Self-pay | Admitting: Internal Medicine

## 2012-01-27 NOTE — Telephone Encounter (Signed)
Pt informed

## 2012-01-27 NOTE — Telephone Encounter (Signed)
Stacey, please, inform patient that her bx was benign Thx  

## 2012-04-25 ENCOUNTER — Other Ambulatory Visit: Payer: Self-pay | Admitting: Internal Medicine

## 2012-05-25 ENCOUNTER — Telehealth: Payer: Self-pay | Admitting: Internal Medicine

## 2012-05-25 NOTE — Telephone Encounter (Signed)
Pt advised via home VM 

## 2012-05-25 NOTE — Telephone Encounter (Signed)
Patient Information:  Caller Name: Fran  Phone: [phone]  Patient: Danielle [name], Danielle [name]  Gender: Female  DOB: Nov 25, 1955  Age: 55 Years  PCP: Plotnikov, Alex (Adults only)  Pregnant: No   Symptoms  Reason For Call & Symptoms: Patient states she has a sinus infection since Saturday 05/19/12.  +cough /dry with discomfort. +sore throat.  Occasional runny nose but "sinus drainage".  Reviewed Health History In EMR: Yes  Reviewed Medications In EMR: Yes  Reviewed Allergies In EMR: Yes  Date of Onset of Symptoms: 05/19/2012  Treatments Tried: Treated with pseduophedrine/sinus and allergy with brief relief and then discomfort returns  Treatments Tried Worked: No OB:  LMP: Unknown  Guideline(s) Used:  Sore Throat  Sinus Pain and Congestion  Disposition Per Guideline:   Home Care  Reason For Disposition Reached:   Sinus congestion as part of a cold, present < 10 days  Advice Given:  Liquids:  Adequate liquid intake is important to prevent dehydration. Drink 6-8 glasses of water per day.  Soft Diet:   Cold drinks and milk shakes are especially good (Reason: swollen tonsils can make some foods hard to swallow).  Reassurance:   Sinus congestion is a normal part of a cold.  Usually home treatment with nasal washes can prevent an actual bacterial sinus infection.  Here is some care advice that should help.  For a Runny Nose With Profuse Discharge:  Nasal mucus and discharge helps to wash viruses and bacteria out of the nose and sinuses.  Blowing the nose is all that is needed.  For a Stuffy Nose - Use Nasal Washes:  Introduction  How it Helps:  Methods  Medicines for a Stuffy or Runny Nose:  If you have a very runny nose and you really think you need a medicine, you can try using a nasal decongestant for a couple days.  Caution - Nasal Decongestants:  Do not take these medications if you have high blood pressure, heart disease, prostate problems, or an overactive thyroid.  Pain and  Fever Medicines:  For pain or fever relief, take either acetaminophen or ibuprofen.  They are over-the-counter (OTC) drugs that help treat both fever and pain. You can buy them at the drugstore.  Treat fevers above 101 F (38.3 C). The goal of fever therapy is to bring the fever down to a comfortable level. Remember that fever medicine usually lowers fever 2 degrees F (1 - 1 1/2 degrees C).  Office Follow Up:  Does the office need to follow up with this patient?: Yes  Instructions For The Office: Patient would like physician to call her in medication for sinus drainage. Discussed speaking with pharmacist prior to purchase due to HTN. Advised i would forward request to the office  RN Note:  Patient would like physician to call her in something.  Advised i would forward request. Home care reviewed with patient. Understanding expressed.

## 2012-05-25 NOTE — Telephone Encounter (Signed)
Agree w/OTC meds - no need for abx - it should get better Thx

## 2012-06-01 ENCOUNTER — Other Ambulatory Visit (INDEPENDENT_AMBULATORY_CARE_PROVIDER_SITE_OTHER): Payer: Medicare Other

## 2012-06-01 ENCOUNTER — Encounter: Payer: Self-pay | Admitting: Internal Medicine

## 2012-06-01 ENCOUNTER — Ambulatory Visit (INDEPENDENT_AMBULATORY_CARE_PROVIDER_SITE_OTHER): Payer: Medicare Other | Admitting: Internal Medicine

## 2012-06-01 VITALS — BP 120/90 | HR 84 | Temp 97.7°F | Resp 16 | Wt 152.0 lb

## 2012-06-01 DIAGNOSIS — Z23 Encounter for immunization: Secondary | ICD-10-CM

## 2012-06-01 DIAGNOSIS — R7309 Other abnormal glucose: Secondary | ICD-10-CM

## 2012-06-01 DIAGNOSIS — M542 Cervicalgia: Secondary | ICD-10-CM

## 2012-06-01 DIAGNOSIS — I10 Essential (primary) hypertension: Secondary | ICD-10-CM

## 2012-06-01 DIAGNOSIS — F39 Unspecified mood [affective] disorder: Secondary | ICD-10-CM

## 2012-06-01 DIAGNOSIS — E785 Hyperlipidemia, unspecified: Secondary | ICD-10-CM

## 2012-06-01 DIAGNOSIS — Z0000 Encounter for general adult medical examination without abnormal findings: Secondary | ICD-10-CM | POA: Diagnosis not present

## 2012-06-01 DIAGNOSIS — R739 Hyperglycemia, unspecified: Secondary | ICD-10-CM

## 2012-06-01 LAB — CBC WITH DIFFERENTIAL/PLATELET
Basophils Absolute: 0 10*3/uL (ref 0.0–0.1)
Basophils Relative: 0.5 % (ref 0.0–3.0)
Eosinophils Absolute: 0.3 10*3/uL (ref 0.0–0.7)
Eosinophils Relative: 3.2 % (ref 0.0–5.0)
HCT: 42.4 % (ref 36.0–46.0)
Hemoglobin: 14.3 g/dL (ref 12.0–15.0)
Lymphocytes Relative: 30.3 % (ref 12.0–46.0)
Lymphs Abs: 2.6 10*3/uL (ref 0.7–4.0)
MCHC: 33.8 g/dL (ref 30.0–36.0)
MCV: 92 fl (ref 78.0–100.0)
Monocytes Absolute: 0.4 10*3/uL (ref 0.1–1.0)
Monocytes Relative: 4.9 % (ref 3.0–12.0)
Neutro Abs: 5.3 10*3/uL (ref 1.4–7.7)
Neutrophils Relative %: 61.1 % (ref 43.0–77.0)
Platelets: 362 10*3/uL (ref 150.0–400.0)
RBC: 4.61 Mil/uL (ref 3.87–5.11)
RDW: 13.4 % (ref 11.5–14.6)
WBC: 8.7 10*3/uL (ref 4.5–10.5)

## 2012-06-01 LAB — URINALYSIS, ROUTINE W REFLEX MICROSCOPIC
Bilirubin Urine: NEGATIVE
Ketones, ur: NEGATIVE
Leukocytes, UA: NEGATIVE
Nitrite: NEGATIVE
Specific Gravity, Urine: >=1.03 (ref 1.000–1.030)
Total Protein, Urine: NEGATIVE
Urine Glucose: NEGATIVE
Urobilinogen, UA: 0.2 (ref 0.0–1.0)
pH: 6 (ref 5.0–8.0)

## 2012-06-01 LAB — HEPATIC FUNCTION PANEL
ALT: 21 U/L (ref 0–35)
AST: 18 U/L (ref 0–37)
Albumin: 4.5 g/dL (ref 3.5–5.2)
Alkaline Phosphatase: 112 U/L (ref 39–117)
Bilirubin, Direct: 0.1 mg/dL (ref 0.0–0.3)
Total Bilirubin: 0.9 mg/dL (ref 0.3–1.2)
Total Protein: 8.1 g/dL (ref 6.0–8.3)

## 2012-06-01 LAB — LIPID PANEL
Cholesterol: 280 mg/dL — ABNORMAL HIGH (ref 0–200)
HDL: 42.9 mg/dL (ref >39.00)
Total CHOL/HDL Ratio: 7
Triglycerides: 203 mg/dL — ABNORMAL HIGH (ref 0.0–149.0)
VLDL: 40.6 mg/dL — ABNORMAL HIGH (ref 0.0–40.0)

## 2012-06-01 LAB — TSH: TSH: 1.16 u[IU]/mL (ref 0.35–5.50)

## 2012-06-01 LAB — BASIC METABOLIC PANEL
BUN: 22 mg/dL (ref 6–23)
CO2: 24 mEq/L (ref 19–32)
Calcium: 9.9 mg/dL (ref 8.4–10.5)
Chloride: 103 mEq/L (ref 96–112)
Creatinine, Ser: 0.9 mg/dL (ref 0.4–1.2)
GFR: 67.3 mL/min (ref >60.00)
Glucose, Bld: 100 mg/dL — ABNORMAL HIGH (ref 70–99)
Potassium: 3.7 mEq/L (ref 3.5–5.1)
Sodium: 136 mEq/L (ref 135–145)

## 2012-06-01 LAB — LDL CHOLESTEROL, DIRECT: Direct LDL: 213.9 mg/dL

## 2012-06-01 MED ORDER — BUPROPION HCL ER (XL) 150 MG PO TB24: 150.0000 mg | ORAL_TABLET | Freq: Every day | ORAL | Status: DC

## 2012-06-01 NOTE — Progress Notes (Signed)
   Subjective:    Patient ID: Danielle [name], female    DOB: Nov 06, 1955, 56 y.o.   MRN: [id number]  HPI  The patient is here for a wellness exam. The patient has been doing well overall without major physical or psychological issues going on lately. The patient needs to address  chronic hypertension that has been well controlled with medicines; to address chronic  hyperlipidemia controlled with medicines as well; and to address type 2 chronic diabetes, controlled with medical treatment and diet.   F/u HTN, depression, elev. glucose   Review of Systems  Constitutional: Negative for chills, activity change, appetite change, fatigue and unexpected weight change.  HENT: Negative for congestion, mouth sores and sinus pressure.   Eyes: Negative for visual disturbance.  Respiratory: Negative for cough and chest tightness.   Gastrointestinal: Negative for nausea.  Genitourinary: Negative for frequency, difficulty urinating and vaginal pain.  Musculoskeletal: Negative for back pain and gait problem.  Skin: Negative for pallor.  Neurological: Negative for dizziness, tremors, weakness and numbness.  Psychiatric/Behavioral: Negative for confusion and sleep disturbance.   BP Readings from Last 3 Encounters:  06/01/12 120/90  01/25/12 130/90  11/30/11 110/80   Wt Readings from Last 3 Encounters:  06/01/12 152 lb (68.947 kg)  01/25/12 151 lb (68.493 kg)  11/30/11 153 lb (69.4 kg)        Objective:   Physical Exam  Constitutional: She appears well-developed. No distress.       Obese   HENT:  Head: Normocephalic.  Right Ear: External ear normal.  Left Ear: External ear normal.  Nose: Nose normal.  Mouth/Throat: Oropharynx is clear and moist.  Eyes: Conjunctivae normal are normal. Pupils are equal, round, and reactive to light. Right eye exhibits no discharge. Left eye exhibits no discharge.  Neck: Normal range of motion. Neck supple. No JVD present. No tracheal deviation present. No  thyromegaly present.  Cardiovascular: Normal rate, regular rhythm and normal heart sounds.   Pulmonary/Chest: No stridor. No respiratory distress. She has no wheezes.  Abdominal: Soft. Bowel sounds are normal. She exhibits no distension and no mass. There is no tenderness. There is no rebound and no guarding.  Musculoskeletal: She exhibits no edema and no tenderness.  Lymphadenopathy:    She has no cervical adenopathy.  Neurological: She displays normal reflexes. No cranial nerve deficit. She exhibits normal muscle tone. Coordination normal.  Skin: No rash noted. No erythema.  Psychiatric: She has a normal mood and affect. Her behavior is normal. Judgment and thought content normal.    Lab Results  Component Value Date   WBC 9.9 03/31/2010   HGB 14.2 03/31/2010   HCT 41.1 03/31/2010   PLT 329.0 03/31/2010   GLUCOSE 90 08/31/2011   ALT 18 08/31/2011   AST 17 08/31/2011   NA 142 08/31/2011   K 4.4 08/31/2011   CL 107 08/31/2011   CREATININE 0.9 08/31/2011   BUN 17 08/31/2011   CO2 25 08/31/2011   TSH 1.36 08/31/2011   HGBA1C 5.9 08/31/2011           Assessment & Plan:

## 2012-06-01 NOTE — Assessment & Plan Note (Signed)
Continue with current prescription therapy as reflected on the Med list.  

## 2012-06-01 NOTE — Assessment & Plan Note (Addendum)
Start Wellbutrin XL 

## 2012-06-01 NOTE — Assessment & Plan Note (Signed)

## 2012-06-02 ENCOUNTER — Telehealth: Payer: Self-pay | Admitting: Internal Medicine

## 2012-06-02 ENCOUNTER — Encounter: Payer: Self-pay | Admitting: Internal Medicine

## 2012-06-02 DIAGNOSIS — E785 Hyperlipidemia, unspecified: Secondary | ICD-10-CM | POA: Insufficient documentation

## 2012-06-02 NOTE — Assessment & Plan Note (Signed)
Ordered lipids. Will discuss rx if needed

## 2012-06-02 NOTE — Telephone Encounter (Signed)
Danielle [name], please, inform patient that all labs are normal except for elev chol. We can start on Rx - is it ok? Thx

## 2012-06-02 NOTE — Assessment & Plan Note (Signed)
Watching labs 

## 2012-06-02 NOTE — Assessment & Plan Note (Signed)
11/13 chronic - due to large breasts - 38D We discussed a potential for breast reduction surgery

## 2012-06-04 NOTE — Telephone Encounter (Signed)
No answer-- unable to leave message.

## 2012-06-06 NOTE — Telephone Encounter (Signed)
Multiple unsuccessful attempts to contact pt. Will mail letter.

## 2012-06-06 NOTE — Telephone Encounter (Signed)
Called pt- no answer/unable to leave vm.  

## 2012-09-04 ENCOUNTER — Encounter: Payer: Medicare Other | Admitting: Internal Medicine

## 2012-09-15 ENCOUNTER — Other Ambulatory Visit: Payer: Self-pay | Admitting: Internal Medicine

## 2012-10-19 ENCOUNTER — Other Ambulatory Visit: Payer: Self-pay | Admitting: Internal Medicine

## 2012-10-20 ENCOUNTER — Other Ambulatory Visit: Payer: Self-pay | Admitting: Internal Medicine

## 2012-11-12 ENCOUNTER — Encounter: Payer: Medicare Other | Admitting: Internal Medicine

## 2012-11-25 ENCOUNTER — Other Ambulatory Visit: Payer: Self-pay | Admitting: Internal Medicine

## 2012-11-26 ENCOUNTER — Encounter: Payer: Medicare Other | Admitting: Internal Medicine

## 2012-12-11 ENCOUNTER — Encounter: Payer: Medicare Other | Admitting: Internal Medicine

## 2013-01-06 ENCOUNTER — Other Ambulatory Visit: Payer: Self-pay | Admitting: Internal Medicine

## 2013-01-20 ENCOUNTER — Other Ambulatory Visit: Payer: Self-pay | Admitting: Internal Medicine

## 2013-02-25 ENCOUNTER — Encounter: Payer: Medicare Other | Admitting: Internal Medicine

## 2013-03-27 ENCOUNTER — Encounter: Payer: Medicare Other | Admitting: Internal Medicine

## 2013-04-24 ENCOUNTER — Other Ambulatory Visit (INDEPENDENT_AMBULATORY_CARE_PROVIDER_SITE_OTHER): Payer: Medicare Other

## 2013-04-24 ENCOUNTER — Ambulatory Visit (INDEPENDENT_AMBULATORY_CARE_PROVIDER_SITE_OTHER): Payer: Medicare Other | Admitting: Internal Medicine

## 2013-04-24 ENCOUNTER — Encounter: Payer: Self-pay | Admitting: Internal Medicine

## 2013-04-24 VITALS — BP 130/100 | HR 76 | Temp 98.3°F | Resp 16 | Ht 62.0 in | Wt 157.0 lb

## 2013-04-24 DIAGNOSIS — I10 Essential (primary) hypertension: Secondary | ICD-10-CM

## 2013-04-24 DIAGNOSIS — M546 Pain in thoracic spine: Secondary | ICD-10-CM | POA: Diagnosis not present

## 2013-04-24 DIAGNOSIS — M2560 Stiffness of unspecified joint, not elsewhere classified: Secondary | ICD-10-CM

## 2013-04-24 DIAGNOSIS — Z136 Encounter for screening for cardiovascular disorders: Secondary | ICD-10-CM

## 2013-04-24 DIAGNOSIS — Z23 Encounter for immunization: Secondary | ICD-10-CM

## 2013-04-24 DIAGNOSIS — Z0000 Encounter for general adult medical examination without abnormal findings: Secondary | ICD-10-CM | POA: Diagnosis not present

## 2013-04-24 DIAGNOSIS — R0683 Snoring: Secondary | ICD-10-CM

## 2013-04-24 DIAGNOSIS — R0989 Other specified symptoms and signs involving the circulatory and respiratory systems: Secondary | ICD-10-CM

## 2013-04-24 DIAGNOSIS — R0609 Other forms of dyspnea: Secondary | ICD-10-CM | POA: Diagnosis not present

## 2013-04-24 LAB — URINALYSIS
Bilirubin Urine: NEGATIVE
Ketones, ur: NEGATIVE
Leukocytes, UA: NEGATIVE
Nitrite: NEGATIVE
Specific Gravity, Urine: 1.015 (ref 1.000–1.030)
Total Protein, Urine: NEGATIVE
Urine Glucose: NEGATIVE
Urobilinogen, UA: 0.2 (ref 0.0–1.0)
pH: 6 (ref 5.0–8.0)

## 2013-04-24 LAB — CBC WITH DIFFERENTIAL/PLATELET
Basophils Absolute: 0.1 10*3/uL (ref 0.0–0.1)
Basophils Relative: 0.7 % (ref 0.0–3.0)
Eosinophils Absolute: 0.3 10*3/uL (ref 0.0–0.7)
Eosinophils Relative: 4.6 % (ref 0.0–5.0)
HCT: 40.4 % (ref 36.0–46.0)
Hemoglobin: 14.1 g/dL (ref 12.0–15.0)
Lymphocytes Relative: 30.5 % (ref 12.0–46.0)
Lymphs Abs: 2.3 10*3/uL (ref 0.7–4.0)
MCHC: 34.8 g/dL (ref 30.0–36.0)
MCV: 90.3 fl (ref 78.0–100.0)
Monocytes Absolute: 0.5 10*3/uL (ref 0.1–1.0)
Monocytes Relative: 6.3 % (ref 3.0–12.0)
Neutro Abs: 4.3 10*3/uL (ref 1.4–7.7)
Neutrophils Relative %: 57.9 % (ref 43.0–77.0)
Platelets: 276 10*3/uL (ref 150.0–400.0)
RBC: 4.48 Mil/uL (ref 3.87–5.11)
RDW: 13 % (ref 11.5–14.6)
WBC: 7.4 10*3/uL (ref 4.5–10.5)

## 2013-04-24 LAB — BASIC METABOLIC PANEL
BUN: 30 mg/dL — ABNORMAL HIGH (ref 6–23)
CO2: 24 mEq/L (ref 19–32)
Calcium: 9.7 mg/dL (ref 8.4–10.5)
Chloride: 106 mEq/L (ref 96–112)
Creatinine, Ser: 1 mg/dL (ref 0.4–1.2)
GFR: 58.88 mL/min — ABNORMAL LOW (ref >60.00)
Glucose, Bld: 106 mg/dL — ABNORMAL HIGH (ref 70–99)
Potassium: 4.1 mEq/L (ref 3.5–5.1)
Sodium: 139 mEq/L (ref 135–145)

## 2013-04-24 LAB — LIPID PANEL
Cholesterol: 292 mg/dL — ABNORMAL HIGH (ref 0–200)
HDL: 50.1 mg/dL (ref >39.00)
Total CHOL/HDL Ratio: 6
Triglycerides: 198 mg/dL — ABNORMAL HIGH (ref 0.0–149.0)
VLDL: 39.6 mg/dL (ref 0.0–40.0)

## 2013-04-24 LAB — HEPATIC FUNCTION PANEL
ALT: 22 U/L (ref 0–35)
AST: 18 U/L (ref 0–37)
Albumin: 4.5 g/dL (ref 3.5–5.2)
Alkaline Phosphatase: 96 U/L (ref 39–117)
Bilirubin, Direct: 0.1 mg/dL (ref 0.0–0.3)
Total Bilirubin: 0.6 mg/dL (ref 0.3–1.2)
Total Protein: 7.9 g/dL (ref 6.0–8.3)

## 2013-04-24 LAB — RHEUMATOID FACTOR: Rhuematoid fact SerPl-aCnc: <10 IU/mL (ref <=14)

## 2013-04-24 LAB — LDL CHOLESTEROL, DIRECT: Direct LDL: 216.8 mg/dL

## 2013-04-24 LAB — TSH: TSH: 1.66 u[IU]/mL (ref 0.35–5.50)

## 2013-04-24 LAB — SEDIMENTATION RATE: Sed Rate: 19 mm/hr (ref 0–22)

## 2013-04-24 NOTE — Assessment & Plan Note (Signed)
2014 - chronic due to large breasts (40 DD) Discussed options

## 2013-04-24 NOTE — Patient Instructions (Signed)
Contour pillow  Gluten free trial (no wheat products) for 4-6 weeks. OK to use gluten-free bread and gluten-free pasta.  Milk free trial (no milk, ice cream, cheese and yogurt) for 4-6 weeks. OK to use almond, coconut, rice or soy milk. "Almond breeze" brand tastes good.   Wt Readings from Last 3 Encounters:  04/24/13 157 lb (71.215 kg)  06/01/12 152 lb (68.947 kg)  01/25/12 151 lb (68.493 kg)

## 2013-04-24 NOTE — Assessment & Plan Note (Signed)
Labs

## 2013-04-24 NOTE — Progress Notes (Signed)
   Subjective:    Patient ID: Danielle [name], female    DOB: [birth year], 56 y.o.   MRN: [id number]  HPI  The patient is here for a wellness exam. The patient has been doing well overall without major physical or psychological issues going on lately, except for morning HAs and snoring. The patient needs to address  chronic hypertension that has been well controlled with medicines; to address chronic  hyperlipidemia controlled with medicines as well; and to address type 2 chronic diabetes, controlled with medical treatment and diet.   F/u HTN, depression, elev. glucose   Review of Systems  Constitutional: Negative for chills, activity change, appetite change, fatigue and unexpected weight change.  HENT: Negative for congestion, mouth sores and sinus pressure.   Eyes: Negative for visual disturbance.  Respiratory: Negative for cough and chest tightness.   Gastrointestinal: Negative for nausea.  Genitourinary: Negative for frequency, difficulty urinating and vaginal pain.  Musculoskeletal: Negative for back pain and gait problem.  Skin: Negative for pallor.  Neurological: Negative for dizziness, tremors, weakness and numbness.  Psychiatric/Behavioral: Negative for confusion and sleep disturbance.   BP Readings from Last 3 Encounters:  04/24/13 130/100  06/01/12 120/90  01/25/12 130/90   Wt Readings from Last 3 Encounters:  04/24/13 157 lb (71.215 kg)  06/01/12 152 lb (68.947 kg)  01/25/12 151 lb (68.493 kg)        Objective:   Physical Exam  Constitutional: She appears well-developed. No distress.  Obese   HENT:  Head: Normocephalic.  Right Ear: External ear normal.  Left Ear: External ear normal.  Nose: Nose normal.  Mouth/Throat: Oropharynx is clear and moist.  Eyes: Conjunctivae are normal. Pupils are equal, round, and reactive to light. Right eye exhibits no discharge. Left eye exhibits no discharge.  Neck: Normal range of motion. Neck supple. No JVD present. No  tracheal deviation present. No thyromegaly present.  Cardiovascular: Normal rate, regular rhythm and normal heart sounds.   Pulmonary/Chest: No stridor. No respiratory distress. She has no wheezes.  Abdominal: Soft. Bowel sounds are normal. She exhibits no distension and no mass. There is no tenderness. There is no rebound and no guarding.  Musculoskeletal: She exhibits no edema and no tenderness.  Lymphadenopathy:    She has no cervical adenopathy.  Neurological: She displays normal reflexes. No cranial nerve deficit. She exhibits normal muscle tone. Coordination normal.  Skin: No rash noted. No erythema.  Psychiatric: She has a normal mood and affect. Her behavior is normal. Judgment and thought content normal.    Lab Results  Component Value Date   WBC 8.7 06/01/2012   HGB 14.3 06/01/2012   HCT 42.4 06/01/2012   PLT 362.0 06/01/2012   GLUCOSE 100* 06/01/2012   CHOL 280* 06/01/2012   TRIG 203.0* 06/01/2012   HDL 42.90 06/01/2012   LDLDIRECT 213.9 06/01/2012   ALT 21 06/01/2012   AST 18 06/01/2012   NA 136 06/01/2012   K 3.7 06/01/2012   CL 103 06/01/2012   CREATININE 0.9 06/01/2012   BUN 22 06/01/2012   CO2 24 06/01/2012   TSH 1.16 06/01/2012   HGBA1C 5.9 08/31/2011           Assessment & Plan:

## 2013-04-24 NOTE — Assessment & Plan Note (Signed)

## 2013-04-24 NOTE — Assessment & Plan Note (Signed)
R/o OSA 10/14 Sleep test

## 2013-04-24 NOTE — Assessment & Plan Note (Signed)
Continue with current prescription therapy as reflected on the Med list.  

## 2013-05-17 ENCOUNTER — Other Ambulatory Visit: Payer: Self-pay | Admitting: Internal Medicine

## 2013-05-21 ENCOUNTER — Telehealth: Payer: Self-pay | Admitting: *Deleted

## 2013-05-21 NOTE — Telephone Encounter (Signed)
Pls go on Mediterranean diet (find it on [facility] website) and take Red Rice yeast extract daily (herbal pills) Thx

## 2013-05-21 NOTE — Telephone Encounter (Signed)
Spoke with pt advised of MDs message 

## 2013-05-21 NOTE — Telephone Encounter (Signed)
Pt called states she was to believe she was going to be prescribed Lipitor but was not.  However pt does not want to take due to the side effects.  Pt is requesting a diet plan to try until appoint in 4.2015.  Please advise

## 2013-05-29 ENCOUNTER — Ambulatory Visit (HOSPITAL_BASED_OUTPATIENT_CLINIC_OR_DEPARTMENT_OTHER): Payer: Medicare Other | Attending: Internal Medicine | Admitting: Radiology

## 2013-05-29 DIAGNOSIS — R0683 Snoring: Secondary | ICD-10-CM

## 2013-05-29 DIAGNOSIS — G4733 Obstructive sleep apnea (adult) (pediatric): Secondary | ICD-10-CM | POA: Insufficient documentation

## 2013-05-29 DIAGNOSIS — M7989 Other specified soft tissue disorders: Secondary | ICD-10-CM | POA: Insufficient documentation

## 2013-05-29 DIAGNOSIS — I10 Essential (primary) hypertension: Secondary | ICD-10-CM | POA: Diagnosis not present

## 2013-05-29 DIAGNOSIS — R5381 Other malaise: Secondary | ICD-10-CM

## 2013-05-29 NOTE — Sleep Study (Signed)
THE PATIENT ARRIVED TO THE SLEEP DISORDERS CENTER TO OBTAIN THE OUT OF CENTER SLEEP TEST DEVICE.THE PATIENT DID NOT APPEAR TO BE IN ANY NOTED DISTRESS.THE [facility] DEMONSTRATED THE APPLICATION OF THE DEVICE TO THE PATIENT. THE PATIENT PRESENTED CLEAR UNDERSTANDING OF THE APPLICATION. THE PATIENT WAS INSTRUCTED TO RETURN THE DEVICE TO THE SEEP CENTER ON THE NEXT BUSINESS DAY...VLB

## 2013-06-05 ENCOUNTER — Other Ambulatory Visit: Payer: Self-pay | Admitting: Internal Medicine

## 2013-06-10 ENCOUNTER — Ambulatory Visit (HOSPITAL_BASED_OUTPATIENT_CLINIC_OR_DEPARTMENT_OTHER): Payer: Medicare Other | Attending: Internal Medicine | Admitting: Radiology

## 2013-06-10 DIAGNOSIS — G4733 Obstructive sleep apnea (adult) (pediatric): Secondary | ICD-10-CM

## 2013-06-15 DIAGNOSIS — G4733 Obstructive sleep apnea (adult) (pediatric): Secondary | ICD-10-CM | POA: Diagnosis not present

## 2013-06-15 NOTE — Sleep Study (Signed)
[facility]   NAME: Danielle [name] DATE OF BIRTH:  September 02, 1955 MEDICAL RECORD NUMBER [id number]  LOCATION: [facility]  PHYSICIAN: Mishell Donalson D  DATE OF STUDY: 06/10/2013  SLEEP STUDY TYPE: Out of Center Sleep Test- unattended                REFERRING PHYSICIAN: Plotnikov, Georgina Quint, MD  INDICATION FOR STUDY: Hypersomnia with sleep apnea  EPWORTH SLEEPINESS SCORE:   HEIGHT:   5'2" WEIGHT:   156 pounds NECK SIZE:   14.5  in.  MEDICATIONS: Charted for review  IMPRESSION:  1) mild obstructive sleep apnea/hypoxia syndrome, AHI 13.2 per hour 2) snoring with oxygen desaturation to a nadir of 70% and mean oxygen saturation through the study of 95% on room air 3) regular cardiac rhythm with mean heart rate 69.6 per minute    RECOMMENDATION:  Scores in this range are usually addressed first with CPAP trial. Dedicated CPAP titration can be scheduled through the sleep disorders Center if appropriate. On an individual basis, fitting for an oral appliance or other approaches may be appropriate.   signed Jehu Mccauslin D. Tristyn Demarest M.D. Waymon Budge Diplomate, American Board of Sleep Medicine  ELECTRONICALLY SIGNED ON:  06/15/2013, 9:10 AM [facility] PH: [phone]   FX: [phone] ACCREDITED BY THE AMERICAN ACADEMY OF SLEEP MEDICINE

## 2013-07-06 ENCOUNTER — Other Ambulatory Visit: Payer: Self-pay | Admitting: Internal Medicine

## 2013-07-25 ENCOUNTER — Telehealth: Payer: Self-pay | Admitting: *Deleted

## 2013-07-25 MED ORDER — CIPROFLOXACIN HCL 250 MG PO TABS: 250.0000 mg | ORAL_TABLET | Freq: Two times a day (BID) | ORAL | Status: DC

## 2013-07-25 NOTE — Telephone Encounter (Signed)
Ok Cipro Thx 

## 2013-07-25 NOTE — Telephone Encounter (Signed)
Patient phoned with cystitis sxs---burning after urination.  States she can't come for an OV until 1/23.. Is there anything that can be called in? Please advise.  Last OV with PCP 04/24/13  CB# [phone]

## 2013-07-25 NOTE — Telephone Encounter (Signed)
Pt informed

## 2013-07-29 ENCOUNTER — Telehealth: Payer: Self-pay | Admitting: *Deleted

## 2013-07-29 NOTE — Telephone Encounter (Signed)
Needs OV and a UA - any provider pls Thx

## 2013-07-29 NOTE — Telephone Encounter (Signed)
Patient phoned stating that the script ordered for bladder infection last week had not cleared up her infection, that she continues to have sxs (burning with urination).  Wishes to know if you want to prescribe something else.  Please advise.   CB# [phone]

## 2013-07-29 NOTE — Telephone Encounter (Signed)
Notified patient of MD's response & recommendations. Stated that since our conversation this morning, she was feeling better.  States that if she isn't completely well in a couple of days, she will call & schedule f/u.

## 2013-08-01 ENCOUNTER — Other Ambulatory Visit (INDEPENDENT_AMBULATORY_CARE_PROVIDER_SITE_OTHER): Payer: Medicare Other

## 2013-08-01 ENCOUNTER — Encounter: Payer: Self-pay | Admitting: Internal Medicine

## 2013-08-01 ENCOUNTER — Ambulatory Visit (INDEPENDENT_AMBULATORY_CARE_PROVIDER_SITE_OTHER): Payer: Medicare Other | Admitting: Internal Medicine

## 2013-08-01 VITALS — BP 160/100 | HR 80 | Temp 97.4°F | Resp 16 | Wt 160.0 lb

## 2013-08-01 DIAGNOSIS — R300 Dysuria: Secondary | ICD-10-CM

## 2013-08-01 DIAGNOSIS — R7309 Other abnormal glucose: Secondary | ICD-10-CM

## 2013-08-01 DIAGNOSIS — R739 Hyperglycemia, unspecified: Secondary | ICD-10-CM

## 2013-08-01 DIAGNOSIS — E669 Obesity, unspecified: Secondary | ICD-10-CM

## 2013-08-01 DIAGNOSIS — I10 Essential (primary) hypertension: Secondary | ICD-10-CM | POA: Diagnosis not present

## 2013-08-01 DIAGNOSIS — E785 Hyperlipidemia, unspecified: Secondary | ICD-10-CM

## 2013-08-01 LAB — URINALYSIS
Bilirubin Urine: NEGATIVE
Hgb urine dipstick: NEGATIVE
Ketones, ur: NEGATIVE
Leukocytes, UA: NEGATIVE
Nitrite: NEGATIVE
Specific Gravity, Urine: <=1.005 — AB (ref 1.000–1.030)
Total Protein, Urine: NEGATIVE
Urine Glucose: NEGATIVE
Urobilinogen, UA: 0.2 (ref 0.0–1.0)
pH: 6.5 (ref 5.0–8.0)

## 2013-08-01 MED ORDER — URIBEL 118 MG PO CAPS: ORAL_CAPSULE | ORAL | Status: DC

## 2013-08-01 MED ORDER — CIPROFLOXACIN HCL 250 MG PO TABS: 250.0000 mg | ORAL_TABLET | Freq: Two times a day (BID) | ORAL | Status: DC

## 2013-08-01 NOTE — Assessment & Plan Note (Addendum)
Continue with current prescription therapy as reflected on the Med list. Discussed - NAS diet; pt is consuming too much salt.Marland KitchenMarland Kitchen

## 2013-08-01 NOTE — Assessment & Plan Note (Addendum)
UA, cx Uribel prn Cipro if needed

## 2013-08-01 NOTE — Patient Instructions (Signed)
Uribel 1 2-3 times a day as needed for bladder symptoms

## 2013-08-01 NOTE — Assessment & Plan Note (Signed)
Discussed wt loss/diet

## 2013-08-01 NOTE — Progress Notes (Signed)
Pre visit review using our clinic review tool, if applicable. No additional management support is needed unless otherwise documented below in the visit note. 

## 2013-08-01 NOTE — Progress Notes (Signed)
   Subjective:    HPI   The patient needs to address  chronic hypertension that has not been well controlled with medicines (very salty foods); to address chronic  hyperlipidemia (declined statins)  F/u HTN, depression, elev. glucose   Review of Systems  Constitutional: Negative for chills, activity change, appetite change, fatigue and unexpected weight change.  HENT: Negative for congestion, mouth sores and sinus pressure.   Eyes: Negative for visual disturbance.  Respiratory: Negative for cough and chest tightness.   Gastrointestinal: Negative for nausea.  Genitourinary: Negative for frequency, difficulty urinating and vaginal pain.  Musculoskeletal: Negative for back pain and gait problem.  Skin: Negative for pallor.  Neurological: Negative for dizziness, tremors, weakness and numbness.  Psychiatric/Behavioral: Negative for confusion and sleep disturbance.   BP Readings from Last 3 Encounters:  08/01/13 160/100  04/24/13 130/100  06/01/12 120/90   Wt Readings from Last 3 Encounters:  08/01/13 160 lb (72.576 kg)  04/24/13 157 lb (71.215 kg)  06/01/12 152 lb (68.947 kg)        Objective:   Physical Exam  Constitutional: She appears well-developed. No distress.  Obese   HENT:  Head: Normocephalic.  Right Ear: External ear normal.  Left Ear: External ear normal.  Nose: Nose normal.  Mouth/Throat: Oropharynx is clear and moist.  Eyes: Conjunctivae are normal. Pupils are equal, round, and reactive to light. Right eye exhibits no discharge. Left eye exhibits no discharge.  Neck: Normal range of motion. Neck supple. No JVD present. No tracheal deviation present. No thyromegaly present.  Cardiovascular: Normal rate, regular rhythm and normal heart sounds.   Pulmonary/Chest: No stridor. No respiratory distress. She has no wheezes.  Abdominal: Soft. Bowel sounds are normal. She exhibits no distension and no mass. There is no tenderness. There is no rebound and no guarding.   Musculoskeletal: She exhibits no edema and no tenderness.  Lymphadenopathy:    She has no cervical adenopathy.  Neurological: She displays normal reflexes. No cranial nerve deficit. She exhibits normal muscle tone. Coordination normal.  Skin: No rash noted. No erythema.  Psychiatric: She has a normal mood and affect. Her behavior is normal. Judgment and thought content normal.    Lab Results  Component Value Date   WBC 7.4 04/24/2013   HGB 14.1 04/24/2013   HCT 40.4 04/24/2013   PLT 276.0 04/24/2013   GLUCOSE 106* 04/24/2013   CHOL 292* 04/24/2013   TRIG 198.0* 04/24/2013   HDL 50.10 04/24/2013   LDLDIRECT 216.8 04/24/2013   ALT 22 04/24/2013   AST 18 04/24/2013   NA 139 04/24/2013   K 4.1 04/24/2013   CL 106 04/24/2013   CREATININE 1.0 04/24/2013   BUN 30* 04/24/2013   CO2 24 04/24/2013   TSH 1.66 04/24/2013   HGBA1C 5.9 08/31/2011           Assessment & Plan:

## 2013-08-01 NOTE — Assessment & Plan Note (Signed)
Diet, wt loss 

## 2013-08-01 NOTE — Assessment & Plan Note (Addendum)
Join [employer] Stop using fat back Better diet

## 2013-08-02 LAB — CULTURE, URINE COMPREHENSIVE
Colony Count: NO GROWTH
Organism ID, Bacteria: NO GROWTH

## 2013-09-19 ENCOUNTER — Telehealth: Payer: Self-pay | Admitting: *Deleted

## 2013-09-19 ENCOUNTER — Other Ambulatory Visit: Payer: Self-pay | Admitting: Internal Medicine

## 2013-09-19 NOTE — Telephone Encounter (Signed)
Completed Uribel PA sent to plan. Will wait for insurance's decision.

## 2013-09-25 ENCOUNTER — Telehealth: Payer: Self-pay | Admitting: Internal Medicine

## 2013-09-25 NOTE — Telephone Encounter (Signed)
Patient states that she has had a chest cold for the past 3-4 days and is asking for a recommendation for something OTC she can try unless something can be prescribed without OV. Says that Mucinex is not working for her. Please advise.

## 2013-09-26 NOTE — Telephone Encounter (Signed)
Use over-the-counter  "cold" medicines  such as  "Afrin" nasal spray for nasal congestion as directed instead. Use" Delsym" or" Robitussin" cough syrup varietis for cough.  You can use plain "Tylenol" or "Advi"l for fever, chills and achyness.   "Common cold" symptoms are usually triggered by a virus.  The antibiotics are usually not necessary. On average, a" viral cold" illness would take 4-7 days to resolve. Please, make an appointment if you are not better or if you're worse.  

## 2013-09-26 NOTE — Telephone Encounter (Signed)
Pt.notified

## 2013-10-23 ENCOUNTER — Encounter: Payer: Self-pay | Admitting: Internal Medicine

## 2013-10-23 ENCOUNTER — Ambulatory Visit (INDEPENDENT_AMBULATORY_CARE_PROVIDER_SITE_OTHER): Payer: Medicare Other | Admitting: Internal Medicine

## 2013-10-23 VITALS — BP 140/90 | HR 72 | Temp 98.2°F | Resp 16 | Wt 161.0 lb

## 2013-10-23 DIAGNOSIS — E669 Obesity, unspecified: Secondary | ICD-10-CM

## 2013-10-23 DIAGNOSIS — I10 Essential (primary) hypertension: Secondary | ICD-10-CM

## 2013-10-23 MED ORDER — PHENTERMINE HCL 37.5 MG PO TABS: 37.5000 mg | ORAL_TABLET | Freq: Every day | ORAL | Status: DC

## 2013-10-23 NOTE — Assessment & Plan Note (Signed)
Continue with current prescription therapy as reflected on the Med list.  

## 2013-10-23 NOTE — Progress Notes (Signed)
Pre visit review using our clinic review tool, if applicable. No additional management support is needed unless otherwise documented below in the visit note. 

## 2013-10-23 NOTE — Progress Notes (Signed)
   Subjective:    HPI   The patient needs to address  chronic hypertension that has not been well controlled with medicines (very salty foods); to address chronic  hyperlipidemia (declined statins)  F/u HTN, depression, elev. Glucose. On a low salt diet. Wants to use Phentermine to help her loose wt   Review of Systems  Constitutional: Negative for chills, activity change, appetite change, fatigue and unexpected weight change.  HENT: Negative for congestion, mouth sores and sinus pressure.   Eyes: Negative for visual disturbance.  Respiratory: Negative for cough and chest tightness.   Gastrointestinal: Negative for nausea.  Genitourinary: Negative for frequency, difficulty urinating and vaginal pain.  Musculoskeletal: Negative for back pain and gait problem.  Skin: Negative for pallor.  Neurological: Negative for dizziness, tremors, weakness and numbness.  Psychiatric/Behavioral: Negative for confusion and sleep disturbance.   BP Readings from Last 3 Encounters:  10/23/13 140/90  08/01/13 160/100  04/24/13 130/100   Wt Readings from Last 3 Encounters:  10/23/13 161 lb (73.029 kg)  08/01/13 160 lb (72.576 kg)  04/24/13 157 lb (71.215 kg)        Objective:   Physical Exam  Constitutional: She appears well-developed. No distress.  Obese   HENT:  Head: Normocephalic.  Right Ear: External ear normal.  Left Ear: External ear normal.  Nose: Nose normal.  Mouth/Throat: Oropharynx is clear and moist.  Eyes: Conjunctivae are normal. Pupils are equal, round, and reactive to light. Right eye exhibits no discharge. Left eye exhibits no discharge.  Neck: Normal range of motion. Neck supple. No JVD present. No tracheal deviation present. No thyromegaly present.  Cardiovascular: Normal rate, regular rhythm and normal heart sounds.   Pulmonary/Chest: No stridor. No respiratory distress. She has no wheezes.  Abdominal: Soft. Bowel sounds are normal. She exhibits no distension and no  mass. There is no tenderness. There is no rebound and no guarding.  Musculoskeletal: She exhibits no edema and no tenderness.  Lymphadenopathy:    She has no cervical adenopathy.  Neurological: She displays normal reflexes. No cranial nerve deficit. She exhibits normal muscle tone. Coordination normal.  Skin: No rash noted. No erythema.  Psychiatric: She has a normal mood and affect. Her behavior is normal. Judgment and thought content normal.    Lab Results  Component Value Date   WBC 7.4 04/24/2013   HGB 14.1 04/24/2013   HCT 40.4 04/24/2013   PLT 276.0 04/24/2013   GLUCOSE 106* 04/24/2013   CHOL 292* 04/24/2013   TRIG 198.0* 04/24/2013   HDL 50.10 04/24/2013   LDLDIRECT 216.8 04/24/2013   ALT 22 04/24/2013   AST 18 04/24/2013   NA 139 04/24/2013   K 4.1 04/24/2013   CL 106 04/24/2013   CREATININE 1.0 04/24/2013   BUN 30* 04/24/2013   CO2 24 04/24/2013   TSH 1.66 04/24/2013   HGBA1C 5.9 08/31/2011           Assessment & Plan:

## 2013-10-23 NOTE — Assessment & Plan Note (Signed)
Wt Readings from Last 3 Encounters:  10/23/13 161 lb (73.029 kg)  08/01/13 160 lb (72.576 kg)  04/24/13 157 lb (71.215 kg)  Will try Phentermine. Monitor BP at home  Potential benefits of a long term phentermine  use as well as potential risks  and complications were explained to the patient and were aknowledged.

## 2013-11-21 ENCOUNTER — Telehealth: Payer: Self-pay | Admitting: Internal Medicine

## 2013-11-21 NOTE — Telephone Encounter (Signed)
Patient Information:  Caller Name: Caylin  Phone: [phone]  Patient: Danielle [name], Danielle [name]  Gender: Female  DOB: Jul 08, 1956  Age: 58 Years  PCP: Plotnikov, Alex (Adults only)  Office Follow Up:  Does the office need to follow up with this patient?: Yes  Instructions For The Office: Plan of care regarding increase in thirst and voiding  RN Note:  Patient calling c/o thirst, chapped lips, and burning sensation on tongue.  States she discussed with Provider at last office visit 1 week agao, but symptoms persist.   Denies swelling, bleeding, or ulcers.  No blisters or Trippett patches noted. Admits to thirst and increase voiding stating "This is normal for me because I'm on a fluid pill."  Admits to borderline Diabetes.  Symptoms  Reason For Call & Symptoms: salty taste in mouth and burn  Reviewed Health History In EMR: Yes  Reviewed Medications In EMR: Yes  Reviewed Allergies In EMR: Yes  Reviewed Surgeries / Procedures: Yes  Date of Onset of Symptoms: 11/14/2013  Guideline(s) Used:  Quay Burow  No Protocol Available - Sick Adult  Disposition Per Guideline:   Discuss with PCP and Callback by Nurse Today  Reason For Disposition Reached:   Nursing judgment  Advice Given:  Call Back If:  New symptoms develop  Patient Will Follow Care Advice:  YES

## 2013-11-22 NOTE — Telephone Encounter (Signed)
appt with Dr. Camila Li on 11/27/13.

## 2013-11-22 NOTE — Telephone Encounter (Signed)
OV - any MD  pls Thx

## 2013-11-22 NOTE — Telephone Encounter (Signed)
Pt called to check up on this problem that she is experiencing. Please advise.

## 2013-11-27 ENCOUNTER — Encounter: Payer: Self-pay | Admitting: Internal Medicine

## 2013-11-27 ENCOUNTER — Ambulatory Visit (INDEPENDENT_AMBULATORY_CARE_PROVIDER_SITE_OTHER): Payer: Medicare Other | Admitting: Internal Medicine

## 2013-11-27 ENCOUNTER — Other Ambulatory Visit (INDEPENDENT_AMBULATORY_CARE_PROVIDER_SITE_OTHER): Payer: Medicare Other

## 2013-11-27 VITALS — BP 112/80 | HR 86 | Temp 98.1°F | Resp 16 | Ht 62.0 in | Wt 153.0 lb

## 2013-11-27 DIAGNOSIS — R7309 Other abnormal glucose: Secondary | ICD-10-CM

## 2013-11-27 DIAGNOSIS — Z79899 Other long term (current) drug therapy: Secondary | ICD-10-CM | POA: Diagnosis not present

## 2013-11-27 DIAGNOSIS — R209 Unspecified disturbances of skin sensation: Secondary | ICD-10-CM

## 2013-11-27 DIAGNOSIS — R439 Unspecified disturbances of smell and taste: Secondary | ICD-10-CM

## 2013-11-27 DIAGNOSIS — G4733 Obstructive sleep apnea (adult) (pediatric): Secondary | ICD-10-CM | POA: Diagnosis not present

## 2013-11-27 DIAGNOSIS — I10 Essential (primary) hypertension: Secondary | ICD-10-CM | POA: Diagnosis not present

## 2013-11-27 DIAGNOSIS — R202 Paresthesia of skin: Secondary | ICD-10-CM

## 2013-11-27 DIAGNOSIS — R739 Hyperglycemia, unspecified: Secondary | ICD-10-CM

## 2013-11-27 LAB — CBC WITH DIFFERENTIAL/PLATELET
Basophils Absolute: 0 10*3/uL (ref 0.0–0.1)
Basophils Relative: 0.4 % (ref 0.0–3.0)
Eosinophils Absolute: 0.3 10*3/uL (ref 0.0–0.7)
Eosinophils Relative: 3.3 % (ref 0.0–5.0)
HCT: 41.7 % (ref 36.0–46.0)
Hemoglobin: 14.4 g/dL (ref 12.0–15.0)
Lymphocytes Relative: 27.6 % (ref 12.0–46.0)
Lymphs Abs: 2.5 10*3/uL (ref 0.7–4.0)
MCHC: 34.5 g/dL (ref 30.0–36.0)
MCV: 90.7 fl (ref 78.0–100.0)
Monocytes Absolute: 0.5 10*3/uL (ref 0.1–1.0)
Monocytes Relative: 5.8 % (ref 3.0–12.0)
Neutro Abs: 5.6 10*3/uL (ref 1.4–7.7)
Neutrophils Relative %: 62.9 % (ref 43.0–77.0)
Platelets: 320 10*3/uL (ref 150.0–400.0)
RBC: 4.59 Mil/uL (ref 3.87–5.11)
RDW: 13.4 % (ref 11.5–15.5)
WBC: 9 10*3/uL (ref 4.0–10.5)

## 2013-11-27 LAB — GLUCOSE, POCT (MANUAL RESULT ENTRY): POC Glucose: 103 mg/dl — AB (ref 70–99)

## 2013-11-27 NOTE — Assessment & Plan Note (Signed)
IMPRESSION:  1) mild obstructive sleep apnea/hypoxia syndrome, AHI 13.2 per hour  2) snoring with oxygen desaturation to a nadir of 70% and mean oxygen saturation through the study of 95% on room air  3) regular cardiac rhythm with mean heart rate 69.6 per minute  RECOMMENDATION:  Scores in this range are usually addressed first with CPAP trial. Dedicated CPAP titration can be scheduled through the sleep disorders Center if appropriate. On an individual basis, fitting for an oral appliance or other approaches may be appropriate.  signed Clinton D. Young M.D.  Deneise Lever  Diplomate, [employer] Board of Sleep Medicine  Pulm referral

## 2013-11-27 NOTE — Patient Instructions (Signed)
Stop one medicine at the time for 1 week. If no better - re-start that medicine and stop another one.Marland KitchenMarland Kitchen

## 2013-11-27 NOTE — Assessment & Plan Note (Signed)
meds reviewed

## 2013-11-27 NOTE — Assessment & Plan Note (Addendum)
Stop one medicine at the time for 1 week. If no better - re-start that medicine and stop another one... Ent ref Brain MRI if not better

## 2013-11-27 NOTE — Assessment & Plan Note (Signed)
Labs

## 2013-11-27 NOTE — Progress Notes (Signed)
Pre visit review using our clinic review tool, if applicable. No additional management support is needed unless otherwise documented below in the visit note. 

## 2013-11-28 LAB — BASIC METABOLIC PANEL
BUN: 14 mg/dL (ref 6–23)
CO2: 26 mEq/L (ref 19–32)
Calcium: 9.9 mg/dL (ref 8.4–10.5)
Chloride: 107 mEq/L (ref 96–112)
Creatinine, Ser: 1.3 mg/dL — ABNORMAL HIGH (ref 0.4–1.2)
GFR: 46.99 mL/min — ABNORMAL LOW (ref >60.00)
Glucose, Bld: 90 mg/dL (ref 70–99)
Potassium: 4.1 mEq/L (ref 3.5–5.1)
Sodium: 142 mEq/L (ref 135–145)

## 2013-11-28 LAB — SEDIMENTATION RATE: Sed Rate: 29 mm/hr — ABNORMAL HIGH (ref 0–22)

## 2013-11-28 LAB — HEPATIC FUNCTION PANEL
ALT: 26 U/L (ref 0–35)
AST: 24 U/L (ref 0–37)
Albumin: 4.3 g/dL (ref 3.5–5.2)
Alkaline Phosphatase: 97 U/L (ref 39–117)
Bilirubin, Direct: 0 mg/dL (ref 0.0–0.3)
Total Bilirubin: 0.4 mg/dL (ref 0.2–1.2)
Total Protein: 7.7 g/dL (ref 6.0–8.3)

## 2013-11-28 LAB — VITAMIN B12: Vitamin B-12: 262 pg/mL (ref 211–911)

## 2013-11-28 LAB — TSH: TSH: 1.06 u[IU]/mL (ref 0.35–4.50)

## 2013-12-02 ENCOUNTER — Other Ambulatory Visit: Payer: Self-pay | Admitting: Internal Medicine

## 2013-12-02 MED ORDER — VITAMIN B-12 1000 MCG SL SUBL
1.0000 | SUBLINGUAL_TABLET | Freq: Every day | SUBLINGUAL | Status: DC
Start: 2013-12-02 — End: 2013-12-03

## 2013-12-02 NOTE — Progress Notes (Signed)
   Subjective:    HPI  C/o taste disorder, burning x 1-2 mo. Nl sense of smell. No pain. No new meds  The patient needs to address  chronic hypertension that has not been well controlled with medicines (very salty foods); to address chronic  hyperlipidemia (declined statins)  F/u HTN, depression, elev. Glucose. On a low salt diet. Wants to use Phentermine to help her loose wt   Review of Systems  Constitutional: Negative for chills, activity change, appetite change, fatigue and unexpected weight change.  HENT: Negative for congestion, dental problem, drooling, ear pain, mouth sores, postnasal drip, rhinorrhea, sinus pressure, sneezing, sore throat, trouble swallowing and voice change.   Eyes: Negative for visual disturbance.  Respiratory: Negative for cough and chest tightness.   Gastrointestinal: Negative for nausea.  Genitourinary: Negative for frequency, difficulty urinating and vaginal pain.  Musculoskeletal: Negative for back pain and gait problem.  Skin: Negative for pallor.  Neurological: Negative for dizziness, tremors, weakness and numbness.  Psychiatric/Behavioral: Negative for confusion and sleep disturbance.   BP Readings from Last 3 Encounters:  11/27/13 112/80  10/23/13 140/90  08/01/13 160/100   Wt Readings from Last 3 Encounters:  11/27/13 153 lb (69.4 kg)  10/23/13 161 lb (73.029 kg)  08/01/13 160 lb (72.576 kg)        Objective:   Physical Exam  Constitutional: She appears well-developed. No distress.  Obese   HENT:  Head: Normocephalic.  Right Ear: External ear normal.  Left Ear: External ear normal.  Nose: Nose normal.  Mouth/Throat: Oropharynx is clear and moist.  Eyes: Conjunctivae are normal. Pupils are equal, round, and reactive to light. Right eye exhibits no discharge. Left eye exhibits no discharge.  Neck: Normal range of motion. Neck supple. No JVD present. No tracheal deviation present. No thyromegaly present.  Cardiovascular: Normal  rate, regular rhythm and normal heart sounds.   Pulmonary/Chest: No stridor. No respiratory distress. She has no wheezes.  Abdominal: Soft. Bowel sounds are normal. She exhibits no distension and no mass. There is no tenderness. There is no rebound and no guarding.  Musculoskeletal: She exhibits no edema and no tenderness.  Lymphadenopathy:    She has no cervical adenopathy.  Neurological: She displays normal reflexes. No cranial nerve deficit. She exhibits normal muscle tone. Coordination normal.  Skin: No rash noted. No erythema.  Psychiatric: She has a normal mood and affect. Her behavior is normal. Judgment and thought content normal.    Lab Results  Component Value Date   WBC 9.0 11/27/2013   HGB 14.4 11/27/2013   HCT 41.7 11/27/2013   PLT 320.0 11/27/2013   GLUCOSE 90 11/27/2013   CHOL 292* 04/24/2013   TRIG 198.0* 04/24/2013   HDL 50.10 04/24/2013   LDLDIRECT 216.8 04/24/2013   ALT 26 11/27/2013   AST 24 11/27/2013   NA 142 11/27/2013   K 4.1 11/27/2013   CL 107 11/27/2013   CREATININE 1.3* 11/27/2013   BUN 14 11/27/2013   CO2 26 11/27/2013   TSH 1.06 11/27/2013   HGBA1C 5.9 08/31/2011           Assessment & Plan:

## 2013-12-03 ENCOUNTER — Telehealth: Payer: Self-pay | Admitting: *Deleted

## 2013-12-03 ENCOUNTER — Other Ambulatory Visit: Payer: Self-pay | Admitting: *Deleted

## 2013-12-03 MED ORDER — VITAMIN B-12 1000 MCG SL SUBL: 1.0000 | SUBLINGUAL_TABLET | Freq: Every day | SUBLINGUAL | Status: DC

## 2013-12-03 NOTE — Telephone Encounter (Signed)
No need to cont samples med (Dexilant) Thx

## 2013-12-03 NOTE — Telephone Encounter (Signed)
Pt wants to know if she is to continue the stomach med that you gave her samples of? If so, she needs a Rx. Please advise.

## 2013-12-03 NOTE — Telephone Encounter (Signed)
Pt informed of below. Please advise on question below.   Notes Recorded by Cassandria Anger, MD on 12/02/2013 at 11:04 AM Danielle [name], please, inform patient that all labs are normal except for a low nl Vit B12. Pls take vit B12 100 mcg SL qd Thx

## 2013-12-04 NOTE — Telephone Encounter (Signed)
Pt informed

## 2013-12-29 ENCOUNTER — Other Ambulatory Visit: Payer: Self-pay | Admitting: Internal Medicine

## 2014-01-07 ENCOUNTER — Institutional Professional Consult (permissible substitution): Payer: Medicare Other | Admitting: Pulmonary Disease

## 2014-01-23 ENCOUNTER — Ambulatory Visit: Payer: Medicare Other | Admitting: Internal Medicine

## 2014-01-24 ENCOUNTER — Other Ambulatory Visit: Payer: Self-pay | Admitting: Internal Medicine

## 2014-01-24 ENCOUNTER — Encounter: Payer: Self-pay | Admitting: Pulmonary Disease

## 2014-01-24 DIAGNOSIS — H04129 Dry eye syndrome of unspecified lacrimal gland: Secondary | ICD-10-CM | POA: Diagnosis not present

## 2014-01-24 DIAGNOSIS — H2510 Age-related nuclear cataract, unspecified eye: Secondary | ICD-10-CM | POA: Diagnosis not present

## 2014-01-24 DIAGNOSIS — H5200 Hypermetropia, unspecified eye: Secondary | ICD-10-CM | POA: Diagnosis not present

## 2014-01-28 ENCOUNTER — Other Ambulatory Visit: Payer: Self-pay | Admitting: Internal Medicine

## 2014-01-30 ENCOUNTER — Other Ambulatory Visit: Payer: Self-pay | Admitting: Internal Medicine

## 2014-01-31 MED ORDER — PHENTERMINE HCL 37.5 MG PO TABS: ORAL_TABLET | ORAL | Status: DC

## 2014-01-31 NOTE — Addendum Note (Signed)
Addended by: Earnstine Regal on: 01/31/2014 08:41 AM   Modules accepted: Orders

## 2014-01-31 NOTE — Telephone Encounter (Signed)
Notified pt rx ready for pick-up.../lmb 

## 2014-01-31 NOTE — Telephone Encounter (Signed)
Reprinted rx md click phone-in can not call phentermine in pt has to pick rx up...Johny Chess

## 2014-02-04 ENCOUNTER — Institutional Professional Consult (permissible substitution): Payer: Medicare Other | Admitting: Pulmonary Disease

## 2014-03-28 ENCOUNTER — Encounter: Payer: Self-pay | Admitting: Internal Medicine

## 2014-03-28 ENCOUNTER — Ambulatory Visit (INDEPENDENT_AMBULATORY_CARE_PROVIDER_SITE_OTHER): Payer: Medicare Other | Admitting: Internal Medicine

## 2014-03-28 VITALS — BP 130/90 | HR 80 | Resp 16 | Wt 136.0 lb

## 2014-03-28 DIAGNOSIS — E669 Obesity, unspecified: Secondary | ICD-10-CM

## 2014-03-28 DIAGNOSIS — Z23 Encounter for immunization: Secondary | ICD-10-CM | POA: Diagnosis not present

## 2014-03-28 DIAGNOSIS — R439 Unspecified disturbances of smell and taste: Secondary | ICD-10-CM | POA: Diagnosis not present

## 2014-03-28 DIAGNOSIS — K219 Gastro-esophageal reflux disease without esophagitis: Secondary | ICD-10-CM

## 2014-03-28 DIAGNOSIS — I10 Essential (primary) hypertension: Secondary | ICD-10-CM

## 2014-03-28 MED ORDER — PHENTERMINE HCL 37.5 MG PO TABS: ORAL_TABLET | ORAL | Status: DC

## 2014-03-28 NOTE — Assessment & Plan Note (Signed)
Continue with current prescription therapy as reflected on the Med list.  

## 2014-03-28 NOTE — Progress Notes (Signed)
Pre visit review using our clinic review tool, if applicable. No additional management support is needed unless otherwise documented below in the visit note. 

## 2014-03-28 NOTE — Assessment & Plan Note (Signed)
Better  

## 2014-03-28 NOTE — Assessment & Plan Note (Signed)
Resolved off PPI

## 2014-03-28 NOTE — Progress Notes (Signed)
   Subjective:    HPI  F/u taste disorder, burning x 1-2 mo -resolved off Prilosec. Nl sense of smell. No pain. No new meds  The patient needs to address  chronic hypertension that has not been well controlled with medicines (very salty foods); to address chronic  hyperlipidemia (declined statins)  F/u HTN, depression, elev. Glucose. On a low salt diet. Wants to use Phentermine to help her loose wt   Review of Systems  Constitutional: Negative for chills, activity change, appetite change, fatigue and unexpected weight change.  HENT: Negative for congestion, dental problem, drooling, ear pain, mouth sores, postnasal drip, rhinorrhea, sinus pressure, sneezing, sore throat, trouble swallowing and voice change.   Eyes: Negative for visual disturbance.  Respiratory: Negative for cough and chest tightness.   Gastrointestinal: Negative for nausea.  Genitourinary: Negative for frequency, difficulty urinating and vaginal pain.  Musculoskeletal: Negative for back pain and gait problem.  Skin: Negative for pallor.  Neurological: Negative for dizziness, tremors, weakness and numbness.  Psychiatric/Behavioral: Negative for confusion and sleep disturbance.   BP Readings from Last 3 Encounters:  03/28/14 130/90  11/27/13 112/80  10/23/13 140/90   Wt Readings from Last 3 Encounters:  03/28/14 136 lb (61.689 kg)  11/27/13 153 lb (69.4 kg)  10/23/13 161 lb (73.029 kg)        Objective:   Physical Exam  Constitutional: She appears well-developed. No distress.  Obese   HENT:  Head: Normocephalic.  Right Ear: External ear normal.  Left Ear: External ear normal.  Nose: Nose normal.  Mouth/Throat: Oropharynx is clear and moist.  Eyes: Conjunctivae are normal. Pupils are equal, round, and reactive to light. Right eye exhibits no discharge. Left eye exhibits no discharge.  Neck: Normal range of motion. Neck supple. No JVD present. No tracheal deviation present. No thyromegaly present.   Cardiovascular: Normal rate, regular rhythm and normal heart sounds.   Pulmonary/Chest: No stridor. No respiratory distress. She has no wheezes.  Abdominal: Soft. Bowel sounds are normal. She exhibits no distension and no mass. There is no tenderness. There is no rebound and no guarding.  Musculoskeletal: She exhibits no edema and no tenderness.  Lymphadenopathy:    She has no cervical adenopathy.  Neurological: She displays normal reflexes. No cranial nerve deficit. She exhibits normal muscle tone. Coordination normal.  Skin: No rash noted. No erythema.  Psychiatric: She has a normal mood and affect. Her behavior is normal. Judgment and thought content normal.    Lab Results  Component Value Date   WBC 9.0 11/27/2013   HGB 14.4 11/27/2013   HCT 41.7 11/27/2013   PLT 320.0 11/27/2013   GLUCOSE 90 11/27/2013   CHOL 292* 04/24/2013   TRIG 198.0* 04/24/2013   HDL 50.10 04/24/2013   LDLDIRECT 216.8 04/24/2013   ALT 26 11/27/2013   AST 24 11/27/2013   NA 142 11/27/2013   K 4.1 11/27/2013   CL 107 11/27/2013   CREATININE 1.3* 11/27/2013   BUN 14 11/27/2013   CO2 26 11/27/2013   TSH 1.06 11/27/2013   HGBA1C 5.9 08/31/2011           Assessment & Plan:

## 2014-03-28 NOTE — Assessment & Plan Note (Signed)
Better  Potential benefits of a long term phentermine  use as well as potential risks  and complications were explained to the patient and were aknowledged.

## 2014-04-07 ENCOUNTER — Other Ambulatory Visit: Payer: Self-pay | Admitting: Internal Medicine

## 2014-05-13 ENCOUNTER — Telehealth: Payer: Self-pay | Admitting: Internal Medicine

## 2014-05-13 NOTE — Telephone Encounter (Signed)
Patient Information:  Caller Name: Vidya  Phone: [phone]  Patient: Danielle [name], Slight  Gender: Female  DOB: 01-03-1956  Age: 58 Years  PCP: Plotnikov, Alex (Adults only)  Office Follow Up:  Does the office need to follow up with this patient?: No  Instructions For The Office: N/A   Symptoms  Reason For Call & Symptoms: Pt fell 1  month ago.  (then pt changed this to 6 months ago - kept changing her dates) Pt states that she has had a painful hip since.  She tells Rn she discussed it with MD on visit 03/1814 (RN checked EPIC and did not see this note) but pt insistent MD said her hip had fluid on it and she would benefit from an injection and to call and schecdule it. She wants to know if she can schedule this for Friday (she lives in [place] and will be in [place] on Friday 05/16/14).  Reviewed Health History In EMR: Yes  Reviewed Medications In EMR: Yes  Reviewed Allergies In EMR: Yes  Reviewed Surgeries / Procedures: Yes  Date of Onset of Symptoms: 03/11/2014  Guideline(s) Used:  Hip Injury  Disposition Per Guideline:   See Within 3 Days in Office  Reason For Disposition Reached:   Injury is still painful or swollen after 2 weeks  Advice Given:  N/A  Patient Refused Recommendation:  Patient Requests Prescription  Pt reqeusting she be scheduled for the "injection" the MD advised in her hip on Friday. Please call to schedule after checking with MD that this is what he wanted?

## 2014-06-30 ENCOUNTER — Ambulatory Visit: Payer: Medicare Other | Admitting: Internal Medicine

## 2014-07-02 ENCOUNTER — Other Ambulatory Visit: Payer: Self-pay | Admitting: Internal Medicine

## 2014-07-15 ENCOUNTER — Ambulatory Visit: Payer: Medicare Other | Admitting: Internal Medicine

## 2014-07-15 ENCOUNTER — Encounter: Payer: Self-pay | Admitting: Internal Medicine

## 2014-07-15 VITALS — BP 140/98 | HR 80 | Temp 97.9°F | Resp 12 | Ht 62.0 in | Wt 133.0 lb

## 2014-07-15 DIAGNOSIS — I10 Essential (primary) hypertension: Secondary | ICD-10-CM

## 2014-07-15 DIAGNOSIS — E669 Obesity, unspecified: Secondary | ICD-10-CM

## 2014-07-15 DIAGNOSIS — K219 Gastro-esophageal reflux disease without esophagitis: Secondary | ICD-10-CM

## 2014-07-15 DIAGNOSIS — M7072 Other bursitis of hip, left hip: Secondary | ICD-10-CM

## 2014-07-15 DIAGNOSIS — M7070 Other bursitis of hip, unspecified hip: Secondary | ICD-10-CM | POA: Insufficient documentation

## 2014-07-15 DIAGNOSIS — M7062 Trochanteric bursitis, left hip: Secondary | ICD-10-CM

## 2014-07-15 DIAGNOSIS — R439 Unspecified disturbances of smell and taste: Secondary | ICD-10-CM

## 2014-07-15 MED ORDER — METHYLPREDNISOLONE ACETATE 40 MG/ML IJ SUSP: 40.0000 mg | Freq: Once | INTRAMUSCULAR | Status: DC

## 2014-07-15 MED ORDER — PHENTERMINE HCL 37.5 MG PO TABS: ORAL_TABLET | ORAL | Status: DC

## 2014-07-15 MED ORDER — METHYLPREDNISOLONE ACETATE 80 MG/ML IJ SUSP
40.0000 mg | Freq: Once | INTRAMUSCULAR | Status: AC
Start: 2014-07-15 — End: 2014-07-15
  Administered 2014-07-15: 40 mg via INTRAMUSCULAR

## 2014-07-15 NOTE — Patient Instructions (Signed)
Postprocedure instructions :    A Band-Aid should be left on for 12 hours. Injection therapy is not a cure itself. It is used in conjunction with other modalities. You can use nonsteroidal anti-inflammatories like ibuprofen , hot and cold compresses. Rest is recommended in the next 24 hours. You need to report immediately  if fever, chills or any signs of infection develop. 

## 2014-07-15 NOTE — Assessment & Plan Note (Signed)
Continue with current prescription therapy as reflected on the Med list.  

## 2014-07-15 NOTE — Assessment & Plan Note (Signed)
resolved 

## 2014-07-15 NOTE — Progress Notes (Signed)
Subjective:    HPI  C/o L hip pain, worse w/pressure  F/u taste disorder, burning x 1-2 mo -resolved off Prilosec. Nl sense of smell. No pain. No new meds  The patient needs to address  chronic hypertension that has not been well controlled with medicines (very salty foods); to address chronic  hyperlipidemia (declined statins)  F/u HTN, depression, elev. Glucose. On a low salt diet. Wants to use Phentermine to help her loose wt   Review of Systems  Constitutional: Negative for chills, activity change, appetite change, fatigue and unexpected weight change.  HENT: Negative for congestion, dental problem, drooling, ear pain, mouth sores, postnasal drip, rhinorrhea, sinus pressure, sneezing, sore throat, trouble swallowing and voice change.   Eyes: Negative for visual disturbance.  Respiratory: Negative for cough and chest tightness.   Gastrointestinal: Negative for nausea.  Genitourinary: Negative for frequency, difficulty urinating and vaginal pain.  Musculoskeletal: Negative for back pain and gait problem.  Skin: Negative for pallor.  Neurological: Negative for dizziness, tremors, weakness and numbness.  Psychiatric/Behavioral: Negative for confusion and sleep disturbance.   BP Readings from Last 3 Encounters:  07/15/14 140/98  03/28/14 130/90  11/27/13 112/80   Wt Readings from Last 3 Encounters:  07/15/14 133 lb (60.328 kg)  03/28/14 136 lb (61.689 kg)  11/27/13 153 lb (69.4 kg)        Objective:   Physical Exam  Constitutional: She appears well-developed. No distress.  Obese   HENT:  Head: Normocephalic.  Right Ear: External ear normal.  Left Ear: External ear normal.  Nose: Nose normal.  Mouth/Throat: Oropharynx is clear and moist.  Eyes: Conjunctivae are normal. Pupils are equal, round, and reactive to light. Right eye exhibits no discharge. Left eye exhibits no discharge.  Neck: Normal range of motion. Neck supple. No JVD present. No tracheal deviation  present. No thyromegaly present.  Cardiovascular: Normal rate, regular rhythm and normal heart sounds.   Pulmonary/Chest: No stridor. No respiratory distress. She has no wheezes.  Abdominal: Soft. Bowel sounds are normal. She exhibits no distension and no mass. There is no tenderness. There is no rebound and no guarding.  Musculoskeletal: She exhibits no edema and no tenderness.  Lymphadenopathy:    She has no cervical adenopathy.  Neurological: She displays normal reflexes. No cranial nerve deficit. She exhibits normal muscle tone. Coordination normal.  Skin: No rash noted. No erythema.  Psychiatric: She has a normal mood and affect. Her behavior is normal. Judgment and thought content normal.  L troch is tender  Lab Results  Component Value Date   WBC 9.0 11/27/2013   HGB 14.4 11/27/2013   HCT 41.7 11/27/2013   PLT 320.0 11/27/2013   GLUCOSE 90 11/27/2013   CHOL 292* 04/24/2013   TRIG 198.0* 04/24/2013   HDL 50.10 04/24/2013   LDLDIRECT 216.8 04/24/2013   ALT 26 11/27/2013   AST 24 11/27/2013   NA 142 11/27/2013   K 4.1 11/27/2013   CL 107 11/27/2013   CREATININE 1.3* 11/27/2013   BUN 14 11/27/2013   CO2 26 11/27/2013   TSH 1.06 11/27/2013   HGBA1C 5.9 08/31/2011      Procedure Note :     Procedure : Joint Injection, L   hip   Indication:  Trochanteric bursitis with refractory  chronic pain.   Risks including unsuccessful procedure , bleeding, infection, bruising, skin atrophy, "steroid flare-up" and others were explained to the patient in detail as well as the benefits. Informed consent was obtained and  signed.   Tthe patient was placed in a comfortable lateral decubitus position. The point of maximal tenderness was identified. Skin was prepped with Betadine and alcohol. Then, a 5 cc syringe with a 2 inch long 24-gauge needle was used for a bursa injection.. The needle was advanced  Into the bursa. I injected the bursa with 4 mL of 2% lidocaine and 40 mg of Depo-Medrol .   Band-Aid was applied.   Tolerated well. Complications: None. Good pain relief following the procedure.         Assessment & Plan:

## 2014-07-15 NOTE — Assessment & Plan Note (Signed)
Phentermine Rx  Potential benefits of a long term phentermine use as well as potential risks  and complications were explained to the patient and were aknowledged.

## 2014-07-15 NOTE — Assessment & Plan Note (Signed)
Options to treat discussed Will inject Ice, stretch

## 2014-07-29 ENCOUNTER — Other Ambulatory Visit: Payer: Self-pay | Admitting: Internal Medicine

## 2014-10-17 ENCOUNTER — Ambulatory Visit: Payer: Medicare Other | Admitting: Internal Medicine

## 2014-11-08 ENCOUNTER — Other Ambulatory Visit: Payer: Self-pay | Admitting: Internal Medicine

## 2014-11-26 ENCOUNTER — Ambulatory Visit: Payer: Medicare Other | Admitting: Internal Medicine

## 2015-01-05 ENCOUNTER — Telehealth: Payer: Self-pay | Admitting: Internal Medicine

## 2015-01-05 NOTE — Telephone Encounter (Signed)
OK OTC 1% hydrocortisone cream - use qid Thx

## 2015-01-05 NOTE — Telephone Encounter (Signed)
Patient has sun poison on her legs is there anything you can reconmend, please advise

## 2015-01-05 NOTE — Telephone Encounter (Signed)
Patient has sun poisoning on her legs,she would like to know if there anything you can recommend?

## 2015-01-05 NOTE — Telephone Encounter (Signed)
Pt informed

## 2015-01-10 ENCOUNTER — Other Ambulatory Visit: Payer: Self-pay | Admitting: Internal Medicine

## 2015-02-25 ENCOUNTER — Ambulatory Visit: Payer: Medicare Other | Admitting: Internal Medicine

## 2015-03-06 ENCOUNTER — Ambulatory Visit: Payer: Medicare Other | Admitting: Internal Medicine

## 2015-04-24 ENCOUNTER — Ambulatory Visit: Payer: Medicare Other | Admitting: Internal Medicine

## 2015-05-04 ENCOUNTER — Ambulatory Visit: Payer: Medicare Other | Admitting: Internal Medicine

## 2015-06-02 ENCOUNTER — Ambulatory Visit: Payer: Medicare Other | Admitting: Internal Medicine

## 2015-06-23 ENCOUNTER — Other Ambulatory Visit: Payer: Self-pay | Admitting: Internal Medicine

## 2015-07-07 ENCOUNTER — Ambulatory Visit: Payer: Medicare Other | Admitting: Internal Medicine

## 2015-07-30 ENCOUNTER — Ambulatory Visit: Payer: Medicare Other | Admitting: Internal Medicine

## 2015-08-05 ENCOUNTER — Ambulatory Visit (INDEPENDENT_AMBULATORY_CARE_PROVIDER_SITE_OTHER): Payer: Medicare Other | Admitting: Internal Medicine

## 2015-08-05 ENCOUNTER — Encounter: Payer: Self-pay | Admitting: Internal Medicine

## 2015-08-05 VITALS — BP 150/72 | HR 76 | Wt 151.0 lb

## 2015-08-05 DIAGNOSIS — G4700 Insomnia, unspecified: Secondary | ICD-10-CM | POA: Diagnosis not present

## 2015-08-05 DIAGNOSIS — E785 Hyperlipidemia, unspecified: Secondary | ICD-10-CM | POA: Diagnosis not present

## 2015-08-05 DIAGNOSIS — I10 Essential (primary) hypertension: Secondary | ICD-10-CM

## 2015-08-05 DIAGNOSIS — Z23 Encounter for immunization: Secondary | ICD-10-CM

## 2015-08-05 MED ORDER — LORCASERIN HCL 10 MG PO TABS: 1.0000 | ORAL_TABLET | Freq: Two times a day (BID) | ORAL | Status: DC

## 2015-08-05 MED ORDER — VERAPAMIL HCL ER 180 MG PO TBCR: 180.0000 mg | EXTENDED_RELEASE_TABLET | Freq: Every day | ORAL | Status: DC

## 2015-08-05 MED ORDER — LOSARTAN POTASSIUM 100 MG PO TABS: 100.0000 mg | ORAL_TABLET | Freq: Every day | ORAL | Status: DC

## 2015-08-05 NOTE — Assessment & Plan Note (Signed)
Pt declined statins 

## 2015-08-05 NOTE — Assessment & Plan Note (Signed)
Chronic BP is ok at home 

## 2015-08-05 NOTE — Progress Notes (Signed)
Pre visit review using our clinic review tool, if applicable. No additional management support is needed unless otherwise documented below in the visit note. 

## 2015-08-05 NOTE — Progress Notes (Signed)
Subjective:  Patient ID: Danielle [name], female    DOB: December 26, 1955  Age: 60 y.o. MRN: [id number]  CC: No chief complaint on file.   HPI Chimere A Schoenherr presents for HTN, obesity, B12 def f/u  Outpatient Prescriptions Prior to Visit  Medication Sig Dispense Refill  . Cholecalciferol (VITAMIN D3) 1000 UNITS CAPS Take by mouth daily.      . Cyanocobalamin (VITAMIN B-12) 1000 MCG SUBL Place 1 tablet (1,000 mcg total) under the tongue daily. 100 tablet 3  . fluocinolone (VANOS) 0.01 % cream Use once daily on scalp rash     . furosemide (LASIX) 20 MG tablet TAKE ONE TABLET BY MOUTH ONCE DAILY 30 tablet 5  . KLOR-CON M10 10 MEQ tablet TAKE ONE TABLET BY MOUTH TWICE DAILY 180 tablet 1  . Loratadine 10 MG CAPS Take by mouth daily.      . mometasone (ELOCON) 0.1 % lotion APPLY   TOPICALLY TO AFFECTED AREA TWICE DAILY 60 mL 0  . losartan (COZAAR) 100 MG tablet TAKE ONE TABLET BY MOUTH ONCE DAILY 30 tablet 11  . verapamil (CALAN) 80 MG tablet TAKE ONE TABLET BY MOUTH TWICE DAILY 180 tablet 0  . phentermine (ADIPEX-P) 37.5 MG tablet TAKE ONE TABLET BY MOUTH ONCE DAILY BEFORE BREAKFAST (Patient not taking: Reported on 08/05/2015) 30 tablet 2   No facility-administered medications prior to visit.    ROS Review of Systems  Constitutional: Negative for chills, activity change, appetite change, fatigue and unexpected weight change.  HENT: Negative for congestion, mouth sores and sinus pressure.   Eyes: Negative for visual disturbance.  Respiratory: Negative for cough and chest tightness.   Gastrointestinal: Negative for nausea and abdominal pain.  Genitourinary: Negative for frequency, difficulty urinating and vaginal pain.  Musculoskeletal: Negative for back pain and gait problem.  Skin: Negative for pallor and rash.  Neurological: Negative for dizziness, tremors, weakness, numbness and headaches.  Psychiatric/Behavioral: Negative for suicidal ideas, confusion and sleep disturbance.    Objective:   BP 150/72 mmHg  Pulse 76  Wt 151 lb (68.493 kg)  SpO2 95%  BP Readings from Last 3 Encounters:  08/05/15 150/72  07/15/14 140/98  03/28/14 130/90    Wt Readings from Last 3 Encounters:  08/05/15 151 lb (68.493 kg)  07/15/14 133 lb (60.328 kg)  03/28/14 136 lb (61.689 kg)    Physical Exam  Constitutional: She appears well-developed. No distress.  HENT:  Head: Normocephalic.  Right Ear: External ear normal.  Left Ear: External ear normal.  Nose: Nose normal.  Mouth/Throat: Oropharynx is clear and moist.  Eyes: Conjunctivae are normal. Pupils are equal, round, and reactive to light. Right eye exhibits no discharge. Left eye exhibits no discharge.  Neck: Normal range of motion. Neck supple. No JVD present. No tracheal deviation present. No thyromegaly present.  Cardiovascular: Normal rate, regular rhythm and normal heart sounds.   Pulmonary/Chest: No stridor. No respiratory distress. She has no wheezes.  Abdominal: Soft. Bowel sounds are normal. She exhibits no distension and no mass. There is no tenderness. There is no rebound and no guarding.  Musculoskeletal: She exhibits no edema or tenderness.  Lymphadenopathy:    She has no cervical adenopathy.  Neurological: She displays normal reflexes. No cranial nerve deficit. She exhibits normal muscle tone. Coordination normal.  Skin: No rash noted. No erythema.  Psychiatric: She has a normal mood and affect. Her behavior is normal. Judgment and thought content normal.    Lab Results  Component Value Date  WBC 9.0 11/27/2013   HGB 14.4 11/27/2013   HCT 41.7 11/27/2013   PLT 320.0 11/27/2013   GLUCOSE 90 11/27/2013   CHOL 292* 04/24/2013   TRIG 198.0* 04/24/2013   HDL 50.10 04/24/2013   LDLDIRECT 216.8 04/24/2013   ALT 26 11/27/2013   AST 24 11/27/2013   NA 142 11/27/2013   K 4.1 11/27/2013   CL 107 11/27/2013   CREATININE 1.3* 11/27/2013   BUN 14 11/27/2013   CO2 26 11/27/2013   TSH 1.06 11/27/2013   HGBA1C 5.9  08/31/2011    No results found.  Assessment & Plan:   Diagnoses and all orders for this visit:  Essential hypertension  INSOMNIA, PERSISTENT  Hyperlipidemia  Need for influenza vaccination -     Flu Vaccine QUAD 36+ mos IM  Other orders -     losartan (COZAAR) 100 MG tablet; Take 1 tablet (100 mg total) by mouth daily. -     Lorcaserin HCl (BELVIQ) 10 MG TABS; Take 1 tablet by mouth 2 (two) times daily. -     verapamil (CALAN-SR) 180 MG CR tablet; Take 1 tablet (180 mg total) by mouth at bedtime.  I have discontinued Ms. Murphey's phentermine and verapamil. I have also changed her losartan. Additionally, I am having her start on Lorcaserin HCl and verapamil. Lastly, I am having her maintain her fluocinolone, Loratadine, Vitamin D3, KLOR-CON M10, furosemide, Vitamin B-12, and mometasone.  Meds ordered this encounter  Medications  . losartan (COZAAR) 100 MG tablet    Sig: Take 1 tablet (100 mg total) by mouth daily.    Dispense:  30 tablet    Refill:  11  . Lorcaserin HCl (BELVIQ) 10 MG TABS    Sig: Take 1 tablet by mouth 2 (two) times daily.    Dispense:  60 tablet    Refill:  2  . verapamil (CALAN-SR) 180 MG CR tablet    Sig: Take 1 tablet (180 mg total) by mouth at bedtime.    Dispense:  90 tablet    Refill:  3     Follow-up: Return in about 3 months (around 11/03/2015) for Wellness Exam.  Walker Kehr, MD

## 2015-09-03 ENCOUNTER — Telehealth: Payer: Self-pay | Admitting: *Deleted

## 2015-09-03 NOTE — Telephone Encounter (Signed)
Ok to d/c Thx 

## 2015-09-03 NOTE — Telephone Encounter (Signed)
Received call pt states md rx Belviq for weight loss been taking med butn haven't loss any weight. Med actually makes her feel bad stiffness, tired and weak. Want to know does she have to wean herself off or can she just stop taking...Danielle [name]

## 2015-09-04 NOTE — Telephone Encounter (Signed)
Called pt no answer LMOM with MD response...Danielle [name]

## 2015-10-21 ENCOUNTER — Other Ambulatory Visit: Payer: Self-pay | Admitting: Internal Medicine

## 2015-11-04 ENCOUNTER — Ambulatory Visit (INDEPENDENT_AMBULATORY_CARE_PROVIDER_SITE_OTHER): Payer: Medicare Other | Admitting: Internal Medicine

## 2015-11-04 ENCOUNTER — Encounter: Payer: Self-pay | Admitting: Internal Medicine

## 2015-11-04 VITALS — BP 150/90 | HR 75 | Ht 62.0 in | Wt 154.0 lb

## 2015-11-04 DIAGNOSIS — Z0000 Encounter for general adult medical examination without abnormal findings: Secondary | ICD-10-CM | POA: Diagnosis not present

## 2015-11-04 DIAGNOSIS — Z1231 Encounter for screening mammogram for malignant neoplasm of breast: Secondary | ICD-10-CM

## 2015-11-04 DIAGNOSIS — I10 Essential (primary) hypertension: Secondary | ICD-10-CM

## 2015-11-04 DIAGNOSIS — E538 Deficiency of other specified B group vitamins: Secondary | ICD-10-CM | POA: Insufficient documentation

## 2015-11-04 DIAGNOSIS — E785 Hyperlipidemia, unspecified: Secondary | ICD-10-CM

## 2015-11-04 DIAGNOSIS — Z1239 Encounter for other screening for malignant neoplasm of breast: Secondary | ICD-10-CM

## 2015-11-04 DIAGNOSIS — K219 Gastro-esophageal reflux disease without esophagitis: Secondary | ICD-10-CM

## 2015-11-04 MED ORDER — MOMETASONE FUROATE 0.1 % EX SOLN: Freq: Two times a day (BID) | CUTANEOUS | Status: DC

## 2015-11-04 MED ORDER — NALTREXONE-BUPROPION HCL ER 8-90 MG PO TB12: ORAL_TABLET | ORAL | Status: DC

## 2015-11-04 MED ORDER — DEXLANSOPRAZOLE 30 MG PO CPDR: 30.0000 mg | DELAYED_RELEASE_CAPSULE | Freq: Every day | ORAL | Status: DC

## 2015-11-04 NOTE — Progress Notes (Signed)
Pre visit review using our clinic review tool, if applicable. No additional management support is needed unless otherwise documented below in the visit note. 

## 2015-11-04 NOTE — Assessment & Plan Note (Signed)
Multiple PPI and H2 intolerances 4/17 worse - Dexilant

## 2015-11-04 NOTE — Patient Instructions (Signed)
Preventive Care for Adults, Female A healthy lifestyle and preventive care can promote health and wellness. Preventive health guidelines for women include the following key practices.  A routine yearly physical is a good way to check with your health care provider about your health and preventive screening. It is a chance to share any concerns and updates on your health and to receive a thorough exam.  Visit your dentist for a routine exam and preventive care every 6 months. Brush your teeth twice a day and floss once a day. Good oral hygiene prevents tooth decay and gum disease.  The frequency of eye exams is based on your age, health, family medical history, use of contact lenses, and other factors. Follow your health care provider's recommendations for frequency of eye exams.  Eat a healthy diet. Foods like vegetables, fruits, whole grains, low-fat dairy products, and lean protein foods contain the nutrients you need without too many calories. Decrease your intake of foods high in solid fats, added sugars, and salt. Eat the right amount of calories for you.Get information about a proper diet from your health care provider, if necessary.  Regular physical exercise is one of the most important things you can do for your health. Most adults should get at least 150 minutes of moderate-intensity exercise (any activity that increases your heart rate and causes you to sweat) each week. In addition, most adults need muscle-strengthening exercises on 2 or more days a week.  Maintain a healthy weight. The body mass index (BMI) is a screening tool to identify possible weight problems. It provides an estimate of body fat based on height and weight. Your health care provider can find your BMI and can help you achieve or maintain a healthy weight.For adults 20 years and older:  A BMI below 18.5 is considered underweight.  A BMI of 18.5 to 24.9 is normal.  A BMI of 25 to 29.9 is considered overweight.  A  BMI of 30 and above is considered obese.  Maintain normal blood lipids and cholesterol levels by exercising and minimizing your intake of saturated fat. Eat a balanced diet with plenty of fruit and vegetables. Blood tests for lipids and cholesterol should begin at age 45 and be repeated every 5 years. If your lipid or cholesterol levels are high, you are over 50, or you are at high risk for heart disease, you may need your cholesterol levels checked more frequently.Ongoing high lipid and cholesterol levels should be treated with medicines if diet and exercise are not working.  If you smoke, find out from your health care provider how to quit. If you do not use tobacco, do not start.  Lung cancer screening is recommended for adults aged 45-80 years who are at high risk for developing lung cancer because of a history of smoking. A yearly low-dose CT scan of the lungs is recommended for people who have at least a 30-pack-year history of smoking and are a current smoker or have quit within the past 15 years. A pack year of smoking is smoking an average of 1 pack of cigarettes a day for 1 year (for example: 1 pack a day for 30 years or 2 packs a day for 15 years). Yearly screening should continue until the smoker has stopped smoking for at least 15 years. Yearly screening should be stopped for people who develop a health problem that would prevent them from having lung cancer treatment.  If you are pregnant, do not drink alcohol. If you are  breastfeeding, be very cautious about drinking alcohol. If you are not pregnant and choose to drink alcohol, do not have more than 1 drink per day. One drink is considered to be 12 ounces (355 mL) of beer, 5 ounces (148 mL) of wine, or 1.5 ounces (44 mL) of liquor.  Avoid use of street drugs. Do not share needles with anyone. Ask for help if you need support or instructions about stopping the use of drugs.  High blood pressure causes heart disease and increases the risk  of stroke. Your blood pressure should be checked at least every 1 to 2 years. Ongoing high blood pressure should be treated with medicines if weight loss and exercise do not work.  If you are 55-79 years old, ask your health care provider if you should take aspirin to prevent strokes.  Diabetes screening is done by taking a blood sample to check your blood glucose level after you have not eaten for a certain period of time (fasting). If you are not overweight and you do not have risk factors for diabetes, you should be screened once every 3 years starting at age 45. If you are overweight or obese and you are 40-70 years of age, you should be screened for diabetes every year as part of your cardiovascular risk assessment.  Breast cancer screening is essential preventive care for women. You should practice "breast self-awareness." This means understanding the normal appearance and feel of your breasts and may include breast self-examination. Any changes detected, no matter how small, should be reported to a health care provider. Women in their 20s and 30s should have a clinical breast exam (CBE) by a health care provider as part of a regular health exam every 1 to 3 years. After age 40, women should have a CBE every year. Starting at age 40, women should consider having a mammogram (breast X-ray test) every year. Women who have a family history of breast cancer should talk to their health care provider about genetic screening. Women at a high risk of breast cancer should talk to their health care providers about having an MRI and a mammogram every year.  Breast cancer gene (BRCA)-related cancer risk assessment is recommended for women who have family members with BRCA-related cancers. BRCA-related cancers include breast, ovarian, tubal, and peritoneal cancers. Having family members with these cancers may be associated with an increased risk for harmful changes (mutations) in the breast cancer genes BRCA1 and  BRCA2. Results of the assessment will determine the need for genetic counseling and BRCA1 and BRCA2 testing.  Your health care provider may recommend that you be screened regularly for cancer of the pelvic organs (ovaries, uterus, and vagina). This screening involves a pelvic examination, including checking for microscopic changes to the surface of your cervix (Pap test). You may be encouraged to have this screening done every 3 years, beginning at age 21.  For women ages 30-65, health care providers may recommend pelvic exams and Pap testing every 3 years, or they may recommend the Pap and pelvic exam, combined with testing for human papilloma virus (HPV), every 5 years. Some types of HPV increase your risk of cervical cancer. Testing for HPV may also be done on women of any age with unclear Pap test results.  Other health care providers may not recommend any screening for nonpregnant women who are considered low risk for pelvic cancer and who do not have symptoms. Ask your health care provider if a screening pelvic exam is right for   you.  If you have had past treatment for cervical cancer or a condition that could lead to cancer, you need Pap tests and screening for cancer for at least 20 years after your treatment. If Pap tests have been discontinued, your risk factors (such as having a new sexual partner) need to be reassessed to determine if screening should resume. Some women have medical problems that increase the chance of getting cervical cancer. In these cases, your health care provider may recommend more frequent screening and Pap tests.  Colorectal cancer can be detected and often prevented. Most routine colorectal cancer screening begins at the age of 50 years and continues through age 75 years. However, your health care provider may recommend screening at an earlier age if you have risk factors for colon cancer. On a yearly basis, your health care provider may provide home test kits to check  for hidden blood in the stool. Use of a small camera at the end of a tube, to directly examine the colon (sigmoidoscopy or colonoscopy), can detect the earliest forms of colorectal cancer. Talk to your health care provider about this at age 50, when routine screening begins. Direct exam of the colon should be repeated every 5-10 years through age 75 years, unless early forms of precancerous polyps or small growths are found.  People who are at an increased risk for hepatitis B should be screened for this virus. You are considered at high risk for hepatitis B if:  You were born in a country where hepatitis B occurs often. Talk with your health care provider about which countries are considered high risk.  Your parents were born in a high-risk country and you have not received a shot to protect against hepatitis B (hepatitis B vaccine).  You have HIV or AIDS.  You use needles to inject street drugs.  You live with, or have sex with, someone who has hepatitis B.  You get hemodialysis treatment.  You take certain medicines for conditions like cancer, organ transplantation, and autoimmune conditions.  Hepatitis C blood testing is recommended for all people born from 1945 through 1965 and any individual with known risks for hepatitis C.  Practice safe sex. Use condoms and avoid high-risk sexual practices to reduce the spread of sexually transmitted infections (STIs). STIs include gonorrhea, chlamydia, syphilis, trichomonas, herpes, HPV, and human immunodeficiency virus (HIV). Herpes, HIV, and HPV are viral illnesses that have no cure. They can result in disability, cancer, and death.  You should be screened for sexually transmitted illnesses (STIs) including gonorrhea and chlamydia if:  You are sexually active and are younger than 24 years.  You are older than 24 years and your health care provider tells you that you are at risk for this type of infection.  Your sexual activity has changed  since you were last screened and you are at an increased risk for chlamydia or gonorrhea. Ask your health care provider if you are at risk.  If you are at risk of being infected with HIV, it is recommended that you take a prescription medicine daily to prevent HIV infection. This is called preexposure prophylaxis (PrEP). You are considered at risk if:  You are sexually active and do not regularly use condoms or know the HIV status of your partner(s).  You take drugs by injection.  You are sexually active with a partner who has HIV.  Talk with your health care provider about whether you are at high risk of being infected with HIV. If   you choose to begin PrEP, you should first be tested for HIV. You should then be tested every 3 months for as long as you are taking PrEP.  Osteoporosis is a disease in which the bones lose minerals and strength with aging. This can result in serious bone fractures or breaks. The risk of osteoporosis can be identified using a bone density scan. Women ages 67 years and over and women at risk for fractures or osteoporosis should discuss screening with their health care providers. Ask your health care provider whether you should take a calcium supplement or vitamin D to reduce the rate of osteoporosis.  Menopause can be associated with physical symptoms and risks. Hormone replacement therapy is available to decrease symptoms and risks. You should talk to your health care provider about whether hormone replacement therapy is right for you.  Use sunscreen. Apply sunscreen liberally and repeatedly throughout the day. You should seek shade when your shadow is shorter than you. Protect yourself by wearing long sleeves, pants, a wide-brimmed hat, and sunglasses year round, whenever you are outdoors.  Once a month, do a whole body skin exam, using a mirror to look at the skin on your back. Tell your health care provider of new moles, moles that have irregular borders, moles that  are larger than a pencil eraser, or moles that have changed in shape or color.  Stay current with required vaccines (immunizations).  Influenza vaccine. All adults should be immunized every year.  Tetanus, diphtheria, and acellular pertussis (Td, Tdap) vaccine. Pregnant women should receive 1 dose of Tdap vaccine during each pregnancy. The dose should be obtained regardless of the length of time since the last dose. Immunization is preferred during the 27th-36th week of gestation. An adult who has not previously received Tdap or who does not know her vaccine status should receive 1 dose of Tdap. This initial dose should be followed by tetanus and diphtheria toxoids (Td) booster doses every 10 years. Adults with an unknown or incomplete history of completing a 3-dose immunization series with Td-containing vaccines should begin or complete a primary immunization series including a Tdap dose. Adults should receive a Td booster every 10 years.  Varicella vaccine. An adult without evidence of immunity to varicella should receive 2 doses or a second dose if she has previously received 1 dose. Pregnant females who do not have evidence of immunity should receive the first dose after pregnancy. This first dose should be obtained before leaving the health care facility. The second dose should be obtained 4-8 weeks after the first dose.  Human papillomavirus (HPV) vaccine. Females aged 13-26 years who have not received the vaccine previously should obtain the 3-dose series. The vaccine is not recommended for use in pregnant females. However, pregnancy testing is not needed before receiving a dose. If a female is found to be pregnant after receiving a dose, no treatment is needed. In that case, the remaining doses should be delayed until after the pregnancy. Immunization is recommended for any person with an immunocompromised condition through the age of 61 years if she did not get any or all doses earlier. During the  3-dose series, the second dose should be obtained 4-8 weeks after the first dose. The third dose should be obtained 24 weeks after the first dose and 16 weeks after the second dose.  Zoster vaccine. One dose is recommended for adults aged 30 years or older unless certain conditions are present.  Measles, mumps, and rubella (MMR) vaccine. Adults born  before 1957 generally are considered immune to measles and mumps. Adults born in 1957 or later should have 1 or more doses of MMR vaccine unless there is a contraindication to the vaccine or there is laboratory evidence of immunity to each of the three diseases. A routine second dose of MMR vaccine should be obtained at least 28 days after the first dose for students attending postsecondary schools, health care workers, or international travelers. People who received inactivated measles vaccine or an unknown type of measles vaccine during 1963-1967 should receive 2 doses of MMR vaccine. People who received inactivated mumps vaccine or an unknown type of mumps vaccine before 1979 and are at high risk for mumps infection should consider immunization with 2 doses of MMR vaccine. For females of childbearing age, rubella immunity should be determined. If there is no evidence of immunity, females who are not pregnant should be vaccinated. If there is no evidence of immunity, females who are pregnant should delay immunization until after pregnancy. Unvaccinated health care workers born before 1957 who lack laboratory evidence of measles, mumps, or rubella immunity or laboratory confirmation of disease should consider measles and mumps immunization with 2 doses of MMR vaccine or rubella immunization with 1 dose of MMR vaccine.  Pneumococcal 13-valent conjugate (PCV13) vaccine. When indicated, a person who is uncertain of his immunization history and has no record of immunization should receive the PCV13 vaccine. All adults 65 years of age and older should receive this  vaccine. An adult aged 19 years or older who has certain medical conditions and has not been previously immunized should receive 1 dose of PCV13 vaccine. This PCV13 should be followed with a dose of pneumococcal polysaccharide (PPSV23) vaccine. Adults who are at high risk for pneumococcal disease should obtain the PPSV23 vaccine at least 8 weeks after the dose of PCV13 vaccine. Adults older than 59 years of age who have normal immune system function should obtain the PPSV23 vaccine dose at least 1 year after the dose of PCV13 vaccine.  Pneumococcal polysaccharide (PPSV23) vaccine. When PCV13 is also indicated, PCV13 should be obtained first. All adults aged 65 years and older should be immunized. An adult younger than age 65 years who has certain medical conditions should be immunized. Any person who resides in a nursing home or long-term care facility should be immunized. An adult smoker should be immunized. People with an immunocompromised condition and certain other conditions should receive both PCV13 and PPSV23 vaccines. People with human immunodeficiency virus (HIV) infection should be immunized as soon as possible after diagnosis. Immunization during chemotherapy or radiation therapy should be avoided. Routine use of PPSV23 vaccine is not recommended for American Indians, [place] Natives, or people younger than 65 years unless there are medical conditions that require PPSV23 vaccine. When indicated, people who have unknown immunization and have no record of immunization should receive PPSV23 vaccine. One-time revaccination 5 years after the first dose of PPSV23 is recommended for people aged 19-64 years who have chronic kidney failure, nephrotic syndrome, asplenia, or immunocompromised conditions. People who received 1-2 doses of PPSV23 before age 65 years should receive another dose of PPSV23 vaccine at age 65 years or later if at least 5 years have passed since the previous dose. Doses of PPSV23 are not  needed for people immunized with PPSV23 at or after age 65 years.  Meningococcal vaccine. Adults with asplenia or persistent complement component deficiencies should receive 2 doses of quadrivalent meningococcal conjugate (MenACWY-D) vaccine. The doses should be obtained   at least 2 months apart. Microbiologists working with certain meningococcal bacteria, [place] recruits, people at risk during an outbreak, and people who travel to or live in countries with a high rate of meningitis should be immunized. A first-year college student up through age 34 years who is living in a residence hall should receive a dose if she did not receive a dose on or after her 16th birthday. Adults who have certain high-risk conditions should receive one or more doses of vaccine.  Hepatitis A vaccine. Adults who wish to be protected from this disease, have certain high-risk conditions, work with hepatitis A-infected animals, work in hepatitis A research labs, or travel to or work in countries with a high rate of hepatitis A should be immunized. Adults who were previously unvaccinated and who anticipate close contact with an international adoptee during the first 60 days after arrival in the [place] from a country with a high rate of hepatitis A should be immunized.  Hepatitis B vaccine. Adults who wish to be protected from this disease, have certain high-risk conditions, may be exposed to blood or other infectious body fluids, are household contacts or sex partners of hepatitis B positive people, are clients or workers in certain care facilities, or travel to or work in countries with a high rate of hepatitis B should be immunized.  Haemophilus influenzae type b (Hib) vaccine. A previously unvaccinated person with asplenia or sickle cell disease or having a scheduled splenectomy should receive 1 dose of Hib vaccine. Regardless of previous immunization, a recipient of a hematopoietic stem cell transplant should receive a  3-dose series 6-12 months after her successful transplant. Hib vaccine is not recommended for adults with HIV infection. Preventive Services / Frequency Ages 35 to 4 years  Blood pressure check.** / Every 3-5 years.  Lipid and cholesterol check.** / Every 5 years beginning at age 60.  Clinical breast exam.** / Every 3 years for women in their 71s and 10s.  BRCA-related cancer risk assessment.** / For women who have family members with a BRCA-related cancer (breast, ovarian, tubal, or peritoneal cancers).  Pap test.** / Every 2 years from ages 76 through 26. Every 3 years starting at age 61 through age 76 or 93 with a history of 3 consecutive normal Pap tests.  HPV screening.** / Every 3 years from ages 37 through ages 60 to 51 with a history of 3 consecutive normal Pap tests.  Hepatitis C blood test.** / For any individual with known risks for hepatitis C.  Skin self-exam. / Monthly.  Influenza vaccine. / Every year.  Tetanus, diphtheria, and acellular pertussis (Tdap, Td) vaccine.** / Consult your health care provider. Pregnant women should receive 1 dose of Tdap vaccine during each pregnancy. 1 dose of Td every 10 years.  Varicella vaccine.** / Consult your health care provider. Pregnant females who do not have evidence of immunity should receive the first dose after pregnancy.  HPV vaccine. / 3 doses over 6 months, if 93 and younger. The vaccine is not recommended for use in pregnant females. However, pregnancy testing is not needed before receiving a dose.  Measles, mumps, rubella (MMR) vaccine.** / You need at least 1 dose of MMR if you were born in 1957 or later. You may also need a 2nd dose. For females of childbearing age, rubella immunity should be determined. If there is no evidence of immunity, females who are not pregnant should be vaccinated. If there is no evidence of immunity, females who are  pregnant should delay immunization until after pregnancy.  Pneumococcal  13-valent conjugate (PCV13) vaccine.** / Consult your health care provider.  Pneumococcal polysaccharide (PPSV23) vaccine.** / 1 to 2 doses if you smoke cigarettes or if you have certain conditions.  Meningococcal vaccine.** / 1 dose if you are age 68 to 8 years and a Market researcher living in a residence hall, or have one of several medical conditions, you need to get vaccinated against meningococcal disease. You may also need additional booster doses.  Hepatitis A vaccine.** / Consult your health care provider.  Hepatitis B vaccine.** / Consult your health care provider.  Haemophilus influenzae type b (Hib) vaccine.** / Consult your health care provider. Ages 7 to 53 years  Blood pressure check.** / Every year.  Lipid and cholesterol check.** / Every 5 years beginning at age 25 years.  Lung cancer screening. / Every year if you are aged 11-80 years and have a 30-pack-year history of smoking and currently smoke or have quit within the past 15 years. Yearly screening is stopped once you have quit smoking for at least 15 years or develop a health problem that would prevent you from having lung cancer treatment.  Clinical breast exam.** / Every year after age 48 years.  BRCA-related cancer risk assessment.** / For women who have family members with a BRCA-related cancer (breast, ovarian, tubal, or peritoneal cancers).  Mammogram.** / Every year beginning at age 41 years and continuing for as long as you are in good health. Consult with your health care provider.  Pap test.** / Every 3 years starting at age 65 years through age 37 or 70 years with a history of 3 consecutive normal Pap tests.  HPV screening.** / Every 3 years from ages 72 years through ages 60 to 40 years with a history of 3 consecutive normal Pap tests.  Fecal occult blood test (FOBT) of stool. / Every year beginning at age 21 years and continuing until age 5 years. You may not need to do this test if you get  a colonoscopy every 10 years.  Flexible sigmoidoscopy or colonoscopy.** / Every 5 years for a flexible sigmoidoscopy or every 10 years for a colonoscopy beginning at age 35 years and continuing until age 48 years.  Hepatitis C blood test.** / For all people born from [birth year] through 1965 and any individual with known risks for hepatitis C.  Skin self-exam. / Monthly.  Influenza vaccine. / Every year.  Tetanus, diphtheria, and acellular pertussis (Tdap/Td) vaccine.** / Consult your health care provider. Pregnant women should receive 1 dose of Tdap vaccine during each pregnancy. 1 dose of Td every 10 years.  Varicella vaccine.** / Consult your health care provider. Pregnant females who do not have evidence of immunity should receive the first dose after pregnancy.  Zoster vaccine.** / 1 dose for adults aged 30 years or older.  Measles, mumps, rubella (MMR) vaccine.** / You need at least 1 dose of MMR if you were born in 1957 or later. You may also need a second dose. For females of childbearing age, rubella immunity should be determined. If there is no evidence of immunity, females who are not pregnant should be vaccinated. If there is no evidence of immunity, females who are pregnant should delay immunization until after pregnancy.  Pneumococcal 13-valent conjugate (PCV13) vaccine.** / Consult your health care provider.  Pneumococcal polysaccharide (PPSV23) vaccine.** / 1 to 2 doses if you smoke cigarettes or if you have certain conditions.  Meningococcal vaccine.** /  Consult your health care provider.  Hepatitis A vaccine.** / Consult your health care provider.  Hepatitis B vaccine.** / Consult your health care provider.  Haemophilus influenzae type b (Hib) vaccine.** / Consult your health care provider. Ages 64 years and over  Blood pressure check.** / Every year.  Lipid and cholesterol check.** / Every 5 years beginning at age 23 years.  Lung cancer screening. / Every year if you  are aged 16-80 years and have a 30-pack-year history of smoking and currently smoke or have quit within the past 15 years. Yearly screening is stopped once you have quit smoking for at least 15 years or develop a health problem that would prevent you from having lung cancer treatment.  Clinical breast exam.** / Every year after age 74 years.  BRCA-related cancer risk assessment.** / For women who have family members with a BRCA-related cancer (breast, ovarian, tubal, or peritoneal cancers).  Mammogram.** / Every year beginning at age 44 years and continuing for as long as you are in good health. Consult with your health care provider.  Pap test.** / Every 3 years starting at age 58 years through age 22 or 39 years with 3 consecutive normal Pap tests. Testing can be stopped between 65 and 70 years with 3 consecutive normal Pap tests and no abnormal Pap or HPV tests in the past 10 years.  HPV screening.** / Every 3 years from ages 64 years through ages 70 or 61 years with a history of 3 consecutive normal Pap tests. Testing can be stopped between 65 and 70 years with 3 consecutive normal Pap tests and no abnormal Pap or HPV tests in the past 10 years.  Fecal occult blood test (FOBT) of stool. / Every year beginning at age 40 years and continuing until age 27 years. You may not need to do this test if you get a colonoscopy every 10 years.  Flexible sigmoidoscopy or colonoscopy.** / Every 5 years for a flexible sigmoidoscopy or every 10 years for a colonoscopy beginning at age 7 years and continuing until age 32 years.  Hepatitis C blood test.** / For all people born from [birth year] through 1964 and any individual with known risks for hepatitis C.  Osteoporosis screening.** / A one-time screening for women ages 30 years and over and women at risk for fractures or osteoporosis.  Skin self-exam. / Monthly.  Influenza vaccine. / Every year.  Tetanus, diphtheria, and acellular pertussis (Tdap/Td)  vaccine.** / 1 dose of Td every 10 years.  Varicella vaccine.** / Consult your health care provider.  Zoster vaccine.** / 1 dose for adults aged 35 years or older.  Pneumococcal 13-valent conjugate (PCV13) vaccine.** / Consult your health care provider.  Pneumococcal polysaccharide (PPSV23) vaccine.** / 1 dose for all adults aged 46 years and older.  Meningococcal vaccine.** / Consult your health care provider.  Hepatitis A vaccine.** / Consult your health care provider.  Hepatitis B vaccine.** / Consult your health care provider.  Haemophilus influenzae type b (Hib) vaccine.** / Consult your health care provider. ** Family history and personal history of risk and conditions may change your health care provider's recommendations.   This information is not intended to replace advice given to you by your health care provider. Make sure you discuss any questions you have with your health care provider.   Document Released: 08/23/2001 Document Revised: 07/18/2014 Document Reviewed: 11/22/2010 Elsevier Interactive Patient Education [employer].

## 2015-11-04 NOTE — Assessment & Plan Note (Signed)
Here for medicare wellness/physical  Diet: heart healthy  Physical activity: not sedentary  Depression/mood screen: negative  Hearing: intact to whispered voice  Visual acuity: grossly normal, performs annual eye exam  ADLs: capable  Fall risk: low to none  Home safety: good  Cognitive evaluation: intact to orientation, naming, recall and repetition  EOL planning: adv directives, full code/ I agree  I have personally reviewed and have noted  1. The patient's medical, surgical and social history  2. Their use of alcohol, tobacco or illicit drugs  3. Their current medications and supplements  4. The patient's functional ability including ADL's, fall risks, home safety risks and hearing or visual impairment.  5. Diet and physical activities  6. Evidence for depression or mood disorders 7. The roster of all physicians providing medical care to patient - is listed in the Snapshot section of the chart and reviewed today.    Today patient counseled on age appropriate routine health concerns for screening and prevention, each reviewed and up to date or declined. Immunizations reviewed and up to date or declined. Labs ordered and reviewed. Risk factors for depression reviewed and negative. Hearing function and visual acuity are intact. ADLs screened and addressed as needed. Functional ability and level of safety reviewed and appropriate. Education, counseling and referrals performed based on assessed risks today. Patient provided with a copy of personalized plan for preventive services.   Cologuard PAP/mammo advised Labs

## 2015-11-04 NOTE — Assessment & Plan Note (Signed)
On B12 Labs 

## 2015-11-04 NOTE — Assessment & Plan Note (Signed)
Losartan, Verapamil

## 2015-11-04 NOTE — Progress Notes (Signed)
Subjective:  Patient ID: Danielle [name], female    DOB: 02-15-1956  Age: 60 y.o. MRN: [id number]  CC: No chief complaint on file.   HPI Danielle [name] presents for a well exam. C/o GERD. C/o wt gain. F/u HTN  Outpatient Prescriptions Prior to Visit  Medication Sig Dispense Refill  . Cholecalciferol (VITAMIN D3) 1000 UNITS CAPS Take by mouth daily.      . Cyanocobalamin (VITAMIN B-12) 1000 MCG SUBL Place 1 tablet (1,000 mcg total) under the tongue daily. 100 tablet 3  . fluocinolone (VANOS) 0.01 % cream Use once daily on scalp rash     . furosemide (LASIX) 20 MG tablet TAKE ONE TABLET BY MOUTH ONCE DAILY 30 tablet 5  . KLOR-CON M10 10 MEQ tablet TAKE ONE TABLET BY MOUTH TWICE DAILY 180 tablet 1  . Loratadine 10 MG CAPS Take by mouth daily.      Marland Kitchen losartan (COZAAR) 100 MG tablet Take 1 tablet (100 mg total) by mouth daily. 30 tablet 11  . verapamil (CALAN-SR) 180 MG CR tablet Take 1 tablet (180 mg total) by mouth at bedtime. 90 tablet 3  . Lorcaserin HCl (BELVIQ) 10 MG TABS Take 1 tablet by mouth 2 (two) times daily. 60 tablet 2  . mometasone (ELOCON) 0.1 % lotion APPLY   TOPICALLY TO AFFECTED AREA TWICE DAILY 60 mL 0  . verapamil (CALAN) 80 MG tablet Take 1 tablet (80 mg total) by mouth 2 (two) times daily. (Patient not taking: Reported on 11/04/2015) 180 tablet 1   No facility-administered medications prior to visit.    ROS Review of Systems  Constitutional: Negative for chills, activity change, appetite change, fatigue and unexpected weight change.  HENT: Negative for congestion, mouth sores and sinus pressure.   Eyes: Negative for visual disturbance.  Respiratory: Negative for cough and chest tightness.   Gastrointestinal: Negative for nausea, vomiting and abdominal pain.  Genitourinary: Negative for frequency, difficulty urinating and vaginal pain.  Musculoskeletal: Negative for back pain and gait problem.  Skin: Negative for pallor and rash.  Neurological: Negative for  dizziness, tremors, weakness, numbness and headaches.  Psychiatric/Behavioral: Negative for confusion and sleep disturbance. The patient is not nervous/anxious.     Objective:  BP 150/90 mmHg  Pulse 75  Ht 5\' 2"  (1.575 m)  Wt 154 lb (69.854 kg)  BMI 28.16 kg/m2  SpO2 94%  BP Readings from Last 3 Encounters:  11/04/15 150/90  08/05/15 150/72  07/15/14 140/98    Wt Readings from Last 3 Encounters:  11/04/15 154 lb (69.854 kg)  08/05/15 151 lb (68.493 kg)  07/15/14 133 lb (60.328 kg)    Physical Exam  Constitutional: She appears well-developed. No distress.  HENT:  Head: Normocephalic.  Right Ear: External ear normal.  Left Ear: External ear normal.  Nose: Nose normal.  Mouth/Throat: Oropharynx is clear and moist.  Eyes: Conjunctivae are normal. Pupils are equal, round, and reactive to light. Right eye exhibits no discharge. Left eye exhibits no discharge.  Neck: Normal range of motion. Neck supple. No JVD present. No tracheal deviation present. No thyromegaly present.  Cardiovascular: Normal rate, regular rhythm and normal heart sounds.   Pulmonary/Chest: No stridor. No respiratory distress. She has no wheezes.  Abdominal: Soft. Bowel sounds are normal. She exhibits no distension and no mass. There is no tenderness. There is no rebound and no guarding.  Musculoskeletal: She exhibits no edema or tenderness.  Lymphadenopathy:    She has no cervical adenopathy.  Neurological:  She displays normal reflexes. No cranial nerve deficit. She exhibits normal muscle tone. Coordination normal.  Skin: No rash noted. No erythema.  Psychiatric: She has a normal mood and affect. Her behavior is normal. Judgment and thought content normal.    Lab Results  Component Value Date   WBC 9.0 11/27/2013   HGB 14.4 11/27/2013   HCT 41.7 11/27/2013   PLT 320.0 11/27/2013   GLUCOSE 90 11/27/2013   CHOL 292* 04/24/2013   TRIG 198.0* 04/24/2013   HDL 50.10 04/24/2013   LDLDIRECT 216.8  04/24/2013   ALT 26 11/27/2013   AST 24 11/27/2013   NA 142 11/27/2013   K 4.1 11/27/2013   CL 107 11/27/2013   CREATININE 1.3* 11/27/2013   BUN 14 11/27/2013   CO2 26 11/27/2013   TSH 1.06 11/27/2013   HGBA1C 5.9 08/31/2011    No results found.  Assessment & Plan:   There are no diagnoses linked to this encounter. I have discontinued Danielle [name]'s Lorcaserin HCl. I have also changed her mometasone. Additionally, I am having her start on Naltrexone-Bupropion HCl ER. Lastly, I am having her maintain her fluocinolone, Loratadine, Vitamin D3, KLOR-CON M10, furosemide, Vitamin B-12, losartan, and verapamil.  Meds ordered this encounter  Medications  . mometasone (ELOCON) 0.1 % lotion    Sig: Apply topically 2 (two) times daily.    Dispense:  60 mL    Refill:  2  . Naltrexone-Bupropion HCl ER (CONTRAVE) 8-90 MG TB12    Sig: 2 po bid    Dispense:  120 tablet    Refill:  3     Follow-up: No Follow-up on file.  Walker Kehr, MD

## 2015-11-10 DIAGNOSIS — Z1212 Encounter for screening for malignant neoplasm of rectum: Secondary | ICD-10-CM | POA: Diagnosis not present

## 2015-11-10 DIAGNOSIS — Z1211 Encounter for screening for malignant neoplasm of colon: Secondary | ICD-10-CM | POA: Diagnosis not present

## 2015-11-10 LAB — COLOGUARD: Cologuard: NEGATIVE

## 2015-11-17 ENCOUNTER — Other Ambulatory Visit: Payer: Self-pay | Admitting: Internal Medicine

## 2015-11-19 ENCOUNTER — Encounter: Payer: Self-pay | Admitting: Internal Medicine

## 2015-11-20 ENCOUNTER — Telehealth: Payer: Self-pay | Admitting: *Deleted

## 2015-11-20 NOTE — Telephone Encounter (Signed)
Pt informed of negative Cologaurd results.  Repeat in 2020.

## 2015-12-01 ENCOUNTER — Ambulatory Visit
Admission: RE | Admit: 2015-12-01 | Discharge: 2015-12-01 | Disposition: A | Discharge status: Home / Self Care | Payer: Medicare Other | Source: Ambulatory Visit | Attending: Internal Medicine | Admitting: Internal Medicine

## 2015-12-01 DIAGNOSIS — Z1231 Encounter for screening mammogram for malignant neoplasm of breast: Secondary | ICD-10-CM | POA: Diagnosis not present

## 2015-12-01 DIAGNOSIS — Z1239 Encounter for other screening for malignant neoplasm of breast: Secondary | ICD-10-CM

## 2015-12-08 ENCOUNTER — Telehealth: Payer: Self-pay | Admitting: *Deleted

## 2015-12-08 MED ORDER — PHENTERMINE HCL 37.5 MG PO TABS: ORAL_TABLET | ORAL | Status: DC

## 2015-12-08 NOTE — Telephone Encounter (Signed)
Called pt no answer LMOM rx fax to [place]...Johny Chess

## 2015-12-08 NOTE — Telephone Encounter (Signed)
Left msg on triage requesting md to give rx for phentermine she states that is the only med that she can take.Marland KitchenLorenza [name]

## 2015-12-08 NOTE — Telephone Encounter (Signed)
OK to fill this prescription with additional refills x2 Thank you!  

## 2016-02-03 ENCOUNTER — Ambulatory Visit: Payer: Medicare Other | Admitting: Internal Medicine

## 2016-03-04 ENCOUNTER — Ambulatory Visit: Payer: Medicare Other | Admitting: Internal Medicine

## 2016-06-13 ENCOUNTER — Ambulatory Visit: Payer: Medicare Other | Admitting: Internal Medicine

## 2016-07-01 ENCOUNTER — Telehealth: Payer: Self-pay | Admitting: Internal Medicine

## 2016-07-01 DIAGNOSIS — I10 Essential (primary) hypertension: Secondary | ICD-10-CM | POA: Diagnosis not present

## 2016-07-01 DIAGNOSIS — R197 Diarrhea, unspecified: Secondary | ICD-10-CM | POA: Diagnosis not present

## 2016-07-01 NOTE — Telephone Encounter (Signed)
[facility] - Client [place] [facility] Patient Name: Danielle [name] DOB: 12-05-1955 Initial Comment Started having diarrhea Wed Night, yesterday it got better. Wants to know what to take Nurse Assessment Nurse: Markus Daft, RN, Sherre Poot Date/Time Eilene Ghazi Time): 07/01/2016 1:46:58 PM Confirm and document reason for call. If symptomatic, describe symptoms. ---Caller states that she started having watery diarrhea Wed night, yesterday it got better. Took Imodium at 12 pm. Wants to know what to take? Her dtr- in law has something for Travelor's diarrhea. Can she take? RN advised no. Does the patient have any new or worsening symptoms? ---Yes Will a triage be completed? ---Yes Related visit to physician within the last 2 weeks? ---No Does the PT have any chronic conditions? (i.e. diabetes, asthma, etc.) ---Yes List chronic conditions. ---HTN Is this a behavioral health or substance abuse call? ---No Guidelines Guideline Title Affirmed Question Affirmed Notes Diarrhea [1] SEVERE diarrhea (e.g., 7 or more times / day more than normal) AND [2] present > 24 hours (1 day) Final Disposition User See Physician within [place], [place], Sherre Poot Comments More than 10 times in last 24 hrs No available appts today. Given # for Saturday clinic. --MD -- Please give advice if something else she can do. OR if you want her to bring a stool sample today. Referrals REFERRED TO PCP OFFICE Disagree/Comply: Comply

## 2016-07-01 NOTE — Telephone Encounter (Signed)
[facility] - Client [place] Call [facility]  Patient Name: Danielle [name]  DOB: 05-27-1956    Initial Comment Caller states she is having diarrhea 15 times in an hour.    Nurse Assessment  Nurse: Wynetta Emery, RN, Baker Janus Date/Time Eilene Ghazi Time): 07/01/2016 5:08:03 PM  Confirm and document reason for call. If symptomatic, describe symptoms. ---Steward Drone started with diarrhea x 1 in Wednesday and started that evening and into the night stopped during the night and back this am off and on all day and can't make it to bathroom can't stop going. took immodium yesterday and started again  Does the patient have any new or worsening symptoms? ---Yes  Will a triage be completed? ---Yes  Related visit to physician within the last 2 weeks? ---No  Does the PT have any chronic conditions? (i.e. diabetes, asthma, etc.) ---Yes  List chronic conditions. ---HTN  Is this a behavioral health or substance abuse call? ---No     Guidelines    Guideline Title Affirmed Question Affirmed Notes  Diarrhea [1] MODERATE diarrhea (e.g., 4-6 times / day more than normal) AND [2] present > 48 hours (2 days)    Final Disposition User   See Physician within [place], RN, [facility]    Referrals  [facility]   Disagree/Comply: Comply

## 2016-07-15 ENCOUNTER — Ambulatory Visit: Payer: Medicare Other | Admitting: Internal Medicine

## 2016-07-29 ENCOUNTER — Ambulatory Visit: Payer: Medicare Other | Admitting: Internal Medicine

## 2016-08-01 ENCOUNTER — Ambulatory Visit: Payer: Medicare Other | Admitting: Internal Medicine

## 2016-08-01 ENCOUNTER — Ambulatory Visit (INDEPENDENT_AMBULATORY_CARE_PROVIDER_SITE_OTHER): Payer: Medicare Other | Admitting: Internal Medicine

## 2016-08-01 ENCOUNTER — Encounter: Payer: Self-pay | Admitting: Internal Medicine

## 2016-08-01 DIAGNOSIS — Z23 Encounter for immunization: Secondary | ICD-10-CM

## 2016-08-01 DIAGNOSIS — I10 Essential (primary) hypertension: Secondary | ICD-10-CM

## 2016-08-01 DIAGNOSIS — K219 Gastro-esophageal reflux disease without esophagitis: Secondary | ICD-10-CM

## 2016-08-01 DIAGNOSIS — E538 Deficiency of other specified B group vitamins: Secondary | ICD-10-CM

## 2016-08-01 DIAGNOSIS — L409 Psoriasis, unspecified: Secondary | ICD-10-CM | POA: Insufficient documentation

## 2016-08-01 MED ORDER — RABEPRAZOLE SODIUM 20 MG PO TBEC: 20.0000 mg | DELAYED_RELEASE_TABLET | Freq: Every day | ORAL | 5 refills | Status: DC

## 2016-08-01 NOTE — Assessment & Plan Note (Signed)
On B12 

## 2016-08-01 NOTE — Assessment & Plan Note (Signed)
Call if arthritis

## 2016-08-01 NOTE — Progress Notes (Signed)
Subjective:  Patient ID: Danielle [name], female    DOB: [birth year]  Age: 61 y.o. MRN: [id number]  CC: No chief complaint on file.   HPI Danielle [name] presents for HTN, obesity. C/o diarrhea - ?stomach virus x 3 d - went to ER in [place] BP ok at home  Outpatient Medications Prior to Visit  Medication Sig Dispense Refill  . Cholecalciferol (VITAMIN D3) 1000 UNITS CAPS Take by mouth daily.      . Cyanocobalamin (VITAMIN B-12) 1000 MCG SUBL Place 1 tablet (1,000 mcg total) under the tongue daily. 100 tablet 3  . losartan (COZAAR) 100 MG tablet Take 1 tablet (100 mg total) by mouth daily. 30 tablet 11  . verapamil (CALAN-SR) 180 MG CR tablet Take 1 tablet (180 mg total) by mouth at bedtime. 90 tablet 3  . Dexlansoprazole (DEXILANT) 30 MG capsule Take 1 capsule (30 mg total) by mouth daily. (Patient not taking: Reported on 08/01/2016) 90 capsule 3  . fluocinolone (VANOS) 0.01 % cream Use once daily on scalp rash     . furosemide (LASIX) 20 MG tablet TAKE ONE TABLET BY MOUTH ONCE DAILY (Patient not taking: Reported on 08/01/2016) 30 tablet 5  . KLOR-CON M10 10 MEQ tablet TAKE ONE TABLET BY MOUTH TWICE DAILY (Patient not taking: Reported on 08/01/2016) 180 tablet 1  . Loratadine 10 MG CAPS Take by mouth daily.      . mometasone (ELOCON) 0.1 % lotion Apply topically 2 (two) times daily. (Patient not taking: Reported on 08/01/2016) 60 mL 2  . Naltrexone-Bupropion HCl ER (CONTRAVE) 8-90 MG TB12 2 po bid (Patient not taking: Reported on 08/01/2016) 120 tablet 3  . phentermine (ADIPEX-P) 37.5 MG tablet TAKE ONE TABLET BY MOUTH ONCE DAILY BEFORE BREAKFAST (Patient not taking: Reported on 08/01/2016) 30 tablet 2   No facility-administered medications prior to visit.     ROS Review of Systems  Constitutional: Negative for activity change, appetite change, chills, fatigue and unexpected weight change.  HENT: Negative for congestion, mouth sores and sinus pressure.   Eyes: Negative for visual disturbance.    Respiratory: Negative for cough and chest tightness.   Gastrointestinal: Negative for abdominal pain and nausea.  Genitourinary: Negative for difficulty urinating, frequency and vaginal pain.  Musculoskeletal: Positive for arthralgias and back pain. Negative for gait problem.  Skin: Positive for rash. Negative for pallor.  Neurological: Negative for dizziness, tremors, weakness, numbness and headaches.  Psychiatric/Behavioral: Negative for confusion and sleep disturbance.    Objective:  BP (!) 148/86   Pulse 85   Wt 157 lb (71.2 kg)   SpO2 94%   BMI 28.72 kg/m   BP Readings from Last 3 Encounters:  08/01/16 (!) 148/86  11/04/15 (!) 150/90  08/05/15 (!) 150/72    Wt Readings from Last 3 Encounters:  08/01/16 157 lb (71.2 kg)  11/04/15 154 lb (69.9 kg)  08/05/15 151 lb (68.5 kg)    Physical Exam  Constitutional: She appears well-developed. No distress.  HENT:  Head: Normocephalic.  Right Ear: External ear normal.  Left Ear: External ear normal.  Nose: Nose normal.  Mouth/Throat: Oropharynx is clear and moist.  Eyes: Conjunctivae are normal. Pupils are equal, round, and reactive to light. Right eye exhibits no discharge. Left eye exhibits no discharge.  Neck: Normal range of motion. Neck supple. No JVD present. No tracheal deviation present. No thyromegaly present.  Cardiovascular: Normal rate, regular rhythm and normal heart sounds.   Pulmonary/Chest: No stridor. No respiratory distress.  She has no wheezes.  Abdominal: Soft. Bowel sounds are normal. She exhibits no distension and no mass. There is no tenderness. There is no rebound and no guarding.  Musculoskeletal: She exhibits no edema or tenderness.  Lymphadenopathy:    She has no cervical adenopathy.  Neurological: She displays normal reflexes. No cranial nerve deficit. She exhibits normal muscle tone. Coordination normal.  Skin: No rash noted. No erythema.  Psychiatric: She has a normal mood and affect. Her  behavior is normal. Judgment and thought content normal.  dry patches  Lab Results  Component Value Date   WBC 9.0 11/27/2013   HGB 14.4 11/27/2013   HCT 41.7 11/27/2013   PLT 320.0 11/27/2013   GLUCOSE 90 11/27/2013   CHOL 292 (H) 04/24/2013   TRIG 198.0 (H) 04/24/2013   HDL 50.10 04/24/2013   LDLDIRECT 216.8 04/24/2013   ALT 26 11/27/2013   AST 24 11/27/2013   NA 142 11/27/2013   K 4.1 11/27/2013   CL 107 11/27/2013   CREATININE 1.3 (H) 11/27/2013   BUN 14 11/27/2013   CO2 26 11/27/2013   TSH 1.06 11/27/2013   HGBA1C 5.9 08/31/2011    Mm Digital Screening Bilateral  Result Date: 12/01/2015 CLINICAL DATA:  Screening. EXAM: DIGITAL SCREENING BILATERAL MAMMOGRAM WITH CAD COMPARISON:  Previous exam(s). ACR Breast Density Category b: There are scattered areas of fibroglandular density. FINDINGS: There are no findings suspicious for malignancy. Images were processed with CAD. IMPRESSION: No mammographic evidence of malignancy. A result letter of this screening mammogram will be mailed directly to the patient. RECOMMENDATION: Screening mammogram in one year. (Code:SM-B-01Y) BI-RADS CATEGORY  1: Negative. Electronically Signed   By: Dorise Bullion III M.D   On: 12/01/2015 17:11    Assessment & Plan:   There are no diagnoses linked to this encounter. I am having Ms. Brideau maintain her fluocinolone, Loratadine, Vitamin D3, KLOR-CON M10, furosemide, Vitamin B-12, losartan, verapamil, mometasone, Naltrexone-Bupropion HCl ER, Dexlansoprazole, and phentermine.  No orders of the defined types were placed in this encounter.    Follow-up: No Follow-up on file.  Walker Kehr, MD

## 2016-08-01 NOTE — Addendum Note (Signed)
Addended by: Cresenciano Lick on: 08/01/2016 01:02 PM   Modules accepted: Orders

## 2016-08-01 NOTE — Assessment & Plan Note (Signed)
BP ok at home Losartan, Verapamil

## 2016-08-01 NOTE — Progress Notes (Signed)
Pre visit review using our clinic review tool, if applicable. No additional management support is needed unless otherwise documented below in the visit note. 

## 2016-08-01 NOTE — Assessment & Plan Note (Signed)
Can take Aciphex per pt

## 2016-09-08 ENCOUNTER — Other Ambulatory Visit: Payer: Self-pay | Admitting: Internal Medicine

## 2016-11-01 ENCOUNTER — Other Ambulatory Visit: Payer: Self-pay | Admitting: Internal Medicine

## 2016-11-04 ENCOUNTER — Ambulatory Visit: Payer: Medicare Other | Admitting: Internal Medicine

## 2016-11-14 ENCOUNTER — Telehealth: Payer: Self-pay | Admitting: Internal Medicine

## 2016-11-14 NOTE — Telephone Encounter (Signed)
Originally patient was scheduled for CPE, pt has Medicare Part A and B, changed appt status to office visit for yearly f/u. Called Mrs. Tetrault to notify her of the change. Lvm for patient to call office.  Patient also needs to schedule AWV with health coach.

## 2016-11-23 ENCOUNTER — Ambulatory Visit: Payer: Medicare Other | Admitting: Internal Medicine

## 2016-11-30 ENCOUNTER — Ambulatory Visit (INDEPENDENT_AMBULATORY_CARE_PROVIDER_SITE_OTHER): Payer: Medicare Other | Admitting: Internal Medicine

## 2016-11-30 ENCOUNTER — Encounter: Payer: Self-pay | Admitting: Internal Medicine

## 2016-11-30 VITALS — BP 142/80 | HR 70 | Temp 98.1°F | Ht 62.0 in | Wt 161.0 lb

## 2016-11-30 DIAGNOSIS — E538 Deficiency of other specified B group vitamins: Secondary | ICD-10-CM | POA: Diagnosis not present

## 2016-11-30 DIAGNOSIS — K219 Gastro-esophageal reflux disease without esophagitis: Secondary | ICD-10-CM | POA: Diagnosis not present

## 2016-11-30 DIAGNOSIS — G4733 Obstructive sleep apnea (adult) (pediatric): Secondary | ICD-10-CM

## 2016-11-30 DIAGNOSIS — R739 Hyperglycemia, unspecified: Secondary | ICD-10-CM

## 2016-11-30 DIAGNOSIS — E785 Hyperlipidemia, unspecified: Secondary | ICD-10-CM

## 2016-11-30 DIAGNOSIS — I10 Essential (primary) hypertension: Secondary | ICD-10-CM

## 2016-11-30 DIAGNOSIS — E669 Obesity, unspecified: Secondary | ICD-10-CM

## 2016-11-30 MED ORDER — FAMOTIDINE 40 MG PO TABS: 40.0000 mg | ORAL_TABLET | Freq: Every day | ORAL | 3 refills | Status: DC

## 2016-11-30 MED ORDER — LOSARTAN POTASSIUM 100 MG PO TABS: 100.0000 mg | ORAL_TABLET | Freq: Every day | ORAL | 3 refills | Status: DC

## 2016-11-30 MED ORDER — VITAMIN D3 50 MCG (2000 UT) PO CAPS: 2000.0000 [IU] | ORAL_CAPSULE | Freq: Every day | ORAL | 3 refills | Status: DC

## 2016-11-30 MED ORDER — CYANOCOBALAMIN 1000 MCG/ML IJ SOLN
1000.0000 ug | Freq: Once | INTRAMUSCULAR | Status: AC
  Administered 2016-11-30: 1000 ug via INTRAMUSCULAR

## 2016-11-30 MED ORDER — VERAPAMIL HCL ER 180 MG PO TBCR: 180.0000 mg | EXTENDED_RELEASE_TABLET | Freq: Every day | ORAL | 3 refills | Status: DC

## 2016-11-30 MED ORDER — ZOSTER VAC RECOMB ADJUVANTED 50 MCG/0.5ML IM SUSR: 0.5000 mL | Freq: Once | INTRAMUSCULAR | 1 refills | Status: AC

## 2016-11-30 NOTE — Progress Notes (Signed)
Pre visit review using our clinic review tool, if applicable. No additional management support is needed unless otherwise documented below in the visit note. 

## 2016-11-30 NOTE — Assessment & Plan Note (Signed)
Labs

## 2016-11-30 NOTE — Patient Instructions (Addendum)
Dr Dennard Nip - weight loss center with [place] Try Turmeric

## 2016-11-30 NOTE — Progress Notes (Signed)
Subjective:  Patient ID: Danielle [name], female    DOB: [birth year]  Age: 61 y.o. MRN: [id number]  CC: No chief complaint on file.   HPI Danielle [name] presents for HTN, B12 def, GERD, obesity f/u  Outpatient Medications Prior to Visit  Medication Sig Dispense Refill  . Cyanocobalamin (VITAMIN B-12) 1000 MCG SUBL Place 1 tablet (1,000 mcg total) under the tongue daily. 100 tablet 3  . losartan (COZAAR) 100 MG tablet Take 1 tablet (100 mg total) by mouth daily. Yearly physical w/labs are due must see MD for refills 30 tablet 0  . verapamil (CALAN-SR) 180 MG CR tablet Take 1 tablet (180 mg total) by mouth at bedtime. Yearly physical due in April must see MD for refills 90 tablet 0  . fluocinolone (VANOS) 0.01 % cream Use once daily on scalp rash     . Cholecalciferol (VITAMIN D3) 1000 UNITS CAPS Take by mouth daily.      . furosemide (LASIX) 20 MG tablet TAKE ONE TABLET BY MOUTH ONCE DAILY (Patient not taking: Reported on 08/01/2016) 30 tablet 5  . KLOR-CON M10 10 MEQ tablet TAKE ONE TABLET BY MOUTH TWICE DAILY (Patient not taking: Reported on 08/01/2016) 180 tablet 1  . Loratadine 10 MG CAPS Take by mouth daily.      . mometasone (ELOCON) 0.1 % lotion Apply topically 2 (two) times daily. (Patient not taking: Reported on 08/01/2016) 60 mL 2  . RABEprazole (ACIPHEX) 20 MG tablet Take 1 tablet (20 mg total) by mouth daily. 30 tablet 5   No facility-administered medications prior to visit.     ROS Review of Systems  Constitutional: Negative for activity change, appetite change, chills, fatigue and unexpected weight change.  HENT: Negative for congestion, mouth sores and sinus pressure.   Eyes: Negative for visual disturbance.  Respiratory: Negative for cough and chest tightness.   Gastrointestinal: Negative for abdominal pain and nausea.  Genitourinary: Negative for difficulty urinating, frequency and vaginal pain.  Musculoskeletal: Negative for back pain and gait problem.  Skin: Negative  for pallor and rash.  Neurological: Negative for dizziness, tremors, weakness, numbness and headaches.  Psychiatric/Behavioral: Negative for confusion, sleep disturbance and suicidal ideas. The patient is nervous/anxious.     Objective:  BP (!) 142/80 (BP Location: Left Arm, Patient Position: Sitting, Cuff Size: Normal)   Pulse 70   Temp 98.1 F (36.7 C) (Oral)   Ht 5\' 2"  (2.993 m)   Wt 161 lb (73 kg)   SpO2 97%   BMI 29.45 kg/m   BP Readings from Last 3 Encounters:  11/30/16 (!) 142/80  08/01/16 (!) 148/86  11/04/15 (!) 150/90    Wt Readings from Last 3 Encounters:  11/30/16 161 lb (73 kg)  08/01/16 157 lb (71.2 kg)  11/04/15 154 lb (69.9 kg)    Physical Exam  Constitutional: She appears well-developed. No distress.  HENT:  Head: Normocephalic.  Right Ear: External ear normal.  Left Ear: External ear normal.  Nose: Nose normal.  Mouth/Throat: Oropharynx is clear and moist.  Eyes: Conjunctivae are normal. Pupils are equal, round, and reactive to light. Right eye exhibits no discharge. Left eye exhibits no discharge.  Neck: Normal range of motion. Neck supple. No JVD present. No tracheal deviation present. No thyromegaly present.  Cardiovascular: Normal rate, regular rhythm and normal heart sounds.   Pulmonary/Chest: No stridor. No respiratory distress. She has no wheezes.  Abdominal: Soft. Bowel sounds are normal. She exhibits no distension and no mass. There  is no tenderness. There is no rebound and no guarding.  Musculoskeletal: She exhibits no edema or tenderness.  Lymphadenopathy:    She has no cervical adenopathy.  Neurological: She displays normal reflexes. No cranial nerve deficit. She exhibits normal muscle tone. Coordination normal.  Skin: No rash noted. No erythema.  Psychiatric: She has a normal mood and affect. Her behavior is normal. Judgment and thought content normal.    Lab Results  Component Value Date   WBC 9.0 11/27/2013   HGB 14.4 11/27/2013    HCT 41.7 11/27/2013   PLT 320.0 11/27/2013   GLUCOSE 90 11/27/2013   CHOL 292 (H) 04/24/2013   TRIG 198.0 (H) 04/24/2013   HDL 50.10 04/24/2013   LDLDIRECT 216.8 04/24/2013   ALT 26 11/27/2013   AST 24 11/27/2013   NA 142 11/27/2013   K 4.1 11/27/2013   CL 107 11/27/2013   CREATININE 1.3 (H) 11/27/2013   BUN 14 11/27/2013   CO2 26 11/27/2013   TSH 1.06 11/27/2013   HGBA1C 5.9 08/31/2011    Mm Digital Screening Bilateral  Result Date: 12/01/2015 CLINICAL DATA:  Screening. EXAM: DIGITAL SCREENING BILATERAL MAMMOGRAM WITH CAD COMPARISON:  Previous exam(s). ACR Breast Density Category b: There are scattered areas of fibroglandular density. FINDINGS: There are no findings suspicious for malignancy. Images were processed with CAD. IMPRESSION: No mammographic evidence of malignancy. A result letter of this screening mammogram will be mailed directly to the patient. RECOMMENDATION: Screening mammogram in one year. (Code:SM-B-01Y) BI-RADS CATEGORY  1: Negative. Electronically Signed   By: Dorise Bullion III M.D   On: 12/01/2015 17:11    Assessment & Plan:   There are no diagnoses linked to this encounter. I have discontinued Danielle [name]'s Loratadine, Vitamin D3, KLOR-CON M10, furosemide, mometasone, and RABEprazole. I am also having her maintain her fluocinolone, Vitamin B-12, verapamil, and losartan.  No orders of the defined types were placed in this encounter.    Follow-up: No Follow-up on file.  Walker Kehr, MD

## 2016-11-30 NOTE — Progress Notes (Signed)
   Subjective:   Patient did not have an AWV today.

## 2016-11-30 NOTE — Assessment & Plan Note (Signed)
B12 IM today. 

## 2016-11-30 NOTE — Assessment & Plan Note (Signed)
Losartan, Verapamil

## 2016-11-30 NOTE — Assessment & Plan Note (Signed)
Dr Dennard Nip - weight loss center with [facility]

## 2016-11-30 NOTE — Assessment & Plan Note (Signed)
TUMs prn

## 2016-12-12 ENCOUNTER — Other Ambulatory Visit (INDEPENDENT_AMBULATORY_CARE_PROVIDER_SITE_OTHER): Payer: Medicare Other

## 2016-12-12 DIAGNOSIS — E785 Hyperlipidemia, unspecified: Secondary | ICD-10-CM | POA: Diagnosis not present

## 2016-12-12 DIAGNOSIS — I10 Essential (primary) hypertension: Secondary | ICD-10-CM

## 2016-12-12 DIAGNOSIS — R739 Hyperglycemia, unspecified: Secondary | ICD-10-CM | POA: Diagnosis not present

## 2016-12-12 LAB — BASIC METABOLIC PANEL
BUN: 22 mg/dL (ref 6–23)
CO2: 25 mEq/L (ref 19–32)
Calcium: 10.7 mg/dL — ABNORMAL HIGH (ref 8.4–10.5)
Chloride: 105 mEq/L (ref 96–112)
Creatinine, Ser: 0.94 mg/dL (ref 0.40–1.20)
GFR: 64.6 mL/min (ref >60.00)
Glucose, Bld: 102 mg/dL — ABNORMAL HIGH (ref 70–99)
Potassium: 4.2 mEq/L (ref 3.5–5.1)
Sodium: 140 mEq/L (ref 135–145)

## 2016-12-12 LAB — URINALYSIS
Bilirubin Urine: NEGATIVE
Hgb urine dipstick: NEGATIVE
Ketones, ur: NEGATIVE
Leukocytes, UA: NEGATIVE
Nitrite: NEGATIVE
Specific Gravity, Urine: <=1.005 — AB (ref 1.000–1.030)
Total Protein, Urine: NEGATIVE
Urine Glucose: NEGATIVE
Urobilinogen, UA: 0.2 (ref 0.0–1.0)
pH: 6.5 (ref 5.0–8.0)

## 2016-12-12 LAB — CBC WITH DIFFERENTIAL/PLATELET
Basophils Absolute: 0.1 10*3/uL (ref 0.0–0.1)
Basophils Relative: 1.4 % (ref 0.0–3.0)
Eosinophils Absolute: 0.5 10*3/uL (ref 0.0–0.7)
Eosinophils Relative: 6.8 % — ABNORMAL HIGH (ref 0.0–5.0)
HCT: 41.3 % (ref 36.0–46.0)
Hemoglobin: 14.5 g/dL (ref 12.0–15.0)
Lymphocytes Relative: 25.8 % (ref 12.0–46.0)
Lymphs Abs: 2.1 10*3/uL (ref 0.7–4.0)
MCHC: 35.1 g/dL (ref 30.0–36.0)
MCV: 89.7 fl (ref 78.0–100.0)
Monocytes Absolute: 0.4 10*3/uL (ref 0.1–1.0)
Monocytes Relative: 5.1 % (ref 3.0–12.0)
Neutro Abs: 4.9 10*3/uL (ref 1.4–7.7)
Neutrophils Relative %: 60.9 % (ref 43.0–77.0)
Platelets: 310 10*3/uL (ref 150.0–400.0)
RBC: 4.61 Mil/uL (ref 3.87–5.11)
RDW: 13.8 % (ref 11.5–15.5)
WBC: 8 10*3/uL (ref 4.0–10.5)

## 2016-12-12 LAB — HEPATIC FUNCTION PANEL
ALT: 17 U/L (ref 0–35)
AST: 16 U/L (ref 0–37)
Albumin: 4.8 g/dL (ref 3.5–5.2)
Alkaline Phosphatase: 95 U/L (ref 39–117)
Bilirubin, Direct: 0.1 mg/dL (ref 0.0–0.3)
Total Bilirubin: 0.7 mg/dL (ref 0.2–1.2)
Total Protein: 7.7 g/dL (ref 6.0–8.3)

## 2016-12-12 LAB — LIPID PANEL
Cholesterol: 278 mg/dL — ABNORMAL HIGH (ref 0–200)
HDL: 51.1 mg/dL (ref >39.00)
LDL Cholesterol: 192 mg/dL — ABNORMAL HIGH (ref 0–99)
NonHDL: 226.49
Total CHOL/HDL Ratio: 5
Triglycerides: 170 mg/dL — ABNORMAL HIGH (ref 0.0–149.0)
VLDL: 34 mg/dL (ref 0.0–40.0)

## 2016-12-12 LAB — HEMOGLOBIN A1C: Hgb A1c MFr Bld: 5.6 % (ref 4.6–6.5)

## 2016-12-12 LAB — TSH: TSH: 1.37 u[IU]/mL (ref 0.35–4.50)

## 2016-12-13 ENCOUNTER — Other Ambulatory Visit: Payer: Self-pay | Admitting: Internal Medicine

## 2016-12-13 DIAGNOSIS — E785 Hyperlipidemia, unspecified: Secondary | ICD-10-CM

## 2017-01-25 ENCOUNTER — Other Ambulatory Visit (INDEPENDENT_AMBULATORY_CARE_PROVIDER_SITE_OTHER): Payer: Medicare Other

## 2017-01-25 DIAGNOSIS — E785 Hyperlipidemia, unspecified: Secondary | ICD-10-CM

## 2017-01-25 LAB — BASIC METABOLIC PANEL
BUN: 20 mg/dL (ref 6–23)
CO2: 26 mEq/L (ref 19–32)
Calcium: 10.1 mg/dL (ref 8.4–10.5)
Chloride: 105 mEq/L (ref 96–112)
Creatinine, Ser: 1 mg/dL (ref 0.40–1.20)
GFR: 60.13 mL/min (ref >60.00)
Glucose, Bld: 104 mg/dL — ABNORMAL HIGH (ref 70–99)
Potassium: 4.2 mEq/L (ref 3.5–5.1)
Sodium: 139 mEq/L (ref 135–145)

## 2017-01-25 LAB — LIPID PANEL
Cholesterol: 265 mg/dL — ABNORMAL HIGH (ref 0–200)
HDL: 53.2 mg/dL (ref >39.00)
LDL Cholesterol: 175 mg/dL — ABNORMAL HIGH (ref 0–99)
NonHDL: 211.93
Total CHOL/HDL Ratio: 5
Triglycerides: 187 mg/dL — ABNORMAL HIGH (ref 0.0–149.0)
VLDL: 37.4 mg/dL (ref 0.0–40.0)

## 2017-01-26 LAB — PTH, INTACT AND CALCIUM
Calcium: 9.8 mg/dL (ref 8.6–10.4)
PTH: 38 pg/mL (ref 14–64)

## 2017-02-24 ENCOUNTER — Ambulatory Visit (INDEPENDENT_AMBULATORY_CARE_PROVIDER_SITE_OTHER): Payer: Medicare Other | Admitting: *Deleted

## 2017-02-24 VITALS — BP 144/67 | HR 74 | Resp 20 | Ht 62.0 in | Wt 159.0 lb

## 2017-02-24 DIAGNOSIS — Z0000 Encounter for general adult medical examination without abnormal findings: Secondary | ICD-10-CM | POA: Diagnosis not present

## 2017-02-24 NOTE — Progress Notes (Addendum)
Subjective:   Danielle [name] is a 61 y.o. female who presents for Medicare Annual (Subsequent) preventive examination.  Review of Systems:  No ROS.  Medicare Wellness Visit. Additional risk factors are reflected in the social history.  Cardiac Risk Factors include: advanced age (>72men, >109 women);hypertension Sleep patterns: feels rested on waking, gets up 1 times nightly to void and sleeps 6-7 hours nightly. Patient reports insomnia issues, discussed recommended sleep tips.  Home Safety/Smoke Alarms: Feels safe in home. Smoke alarms in place.  Living environment; residence and Firearm Safety: 1-story house/ trailer, split level / walkout, no firearms. Lives with husband, no needs for DME, good support system. Seat Belt Safety/Bike Helmet: Wears seat belt.   Female:   Pap- N/D, patient will make an appointment with her GYN      Mammo- Last 12/01/15, BI-RADS category 1: negative      Dexa scan- N/D      CCS- N/D, Cologuard negative     Objective:     Vitals: BP (!) 144/67   Pulse 74   Resp 20   Ht 5\' 2"  (1.575 m)   Wt 159 lb (72.1 kg)   SpO2 97%   BMI 29.08 kg/m   Body mass index is 29.08 kg/m.   Tobacco History  Smoking Status  . Former Smoker  Smokeless Tobacco  . Never Used     Counseling given: Not Answered   Past Medical History:  Diagnosis Date  . Acute sinusitis, unspecified 10/24/2008  . ALLERGIC RHINITIS 10/24/2008  . BRONCHITIS, ACUTE 06/13/2008  . Cough 06/13/2008  . DYSPNEA 05/07/2010  . FOOT PAIN 04/02/2010  . GERD 02/19/2007  . 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 HYPERTENSION 02/15/2008  . INSOMNIA, PERSISTENT 02/15/2008  . INTRAABDOMINAL ABSCESS 02/19/2007  . MOOD DISORDER 02/20/2009  . OBESITY 02/20/2009  . TOBACCO USE, QUIT 09/10/2009   Past Surgical History:  Procedure Laterality Date  . ABDOMINAL HYSTERECTOMY    . CHOLECYSTECTOMY    . ERCP     Family History  Problem Relation Age of Onset  . Hypertension Mother   .  Hypertension Father   . Hypertension Other    History  Sexual Activity  . Sexual activity: Yes    Outpatient Encounter Prescriptions as of 02/24/2017  Medication Sig  . Cholecalciferol (VITAMIN D3) 2000 units capsule Take 1 capsule (2,000 Units total) by mouth daily.  . Cyanocobalamin (VITAMIN B-12) 1000 MCG SUBL Place 1 tablet (1,000 mcg total) under the tongue daily.  . fluocinolone (VANOS) 0.01 % cream Use once daily on scalp rash   . losartan (COZAAR) 100 MG tablet Take 1 tablet (100 mg total) by mouth daily.  . verapamil (CALAN-SR) 180 MG CR tablet Take 1 tablet (180 mg total) by mouth at bedtime.   No facility-administered encounter medications on file as of 02/24/2017.     Activities of Daily Living In your present state of health, do you have any difficulty performing the following activities: 02/24/2017  Hearing? N  Vision? N  Difficulty concentrating or making decisions? N  Walking or climbing stairs? N  Dressing or bathing? N  Doing errands, shopping? N  Preparing Food and eating ? N  Using the Toilet? N  In the past six months, have you accidently leaked urine? N  Do you have problems with loss of bowel control? N  Managing your Medications? N  Managing your Finances? N  Housekeeping or managing your Housekeeping? N  Some recent data might  be hidden    Patient Care Team: Plotnikov, Evie Lacks, MD as PCP - General    Assessment:    Physical assessment deferred to PCP.  Exercise Activities and Dietary recommendations Current Exercise Habits: The patient does not participate in regular exercise at present, Exercise limited by: None identified  Diet (meal preparation, eat out, water intake, caffeinated beverages, dairy products, fruits and vegetables): in general, an "unhealthy" diet, patient reports that she is starting to make healthier food choices, drinks 6-8 glasses of water daily.   Reviewed heart healthy and diabetic diet, portion control, monitoring  carbohydrates and increasing high  protein foods, weight loss tips. Diet education was attached to patient's AVS. Relevant patient education assigned to patient using Emmi.   Goals    . lose weight          Increase my physical activity by walking, use portion control and eat as healthy as possible.      Fall Risk Fall Risk  02/24/2017  Falls in the past year? Yes  Risk for fall due to : Impaired balance/gait   Depression Screen PHQ 2/9 Scores 02/24/2017 11/30/2016  PHQ - 2 Score 0 0  PHQ- 9 Score 2 -     Cognitive Function       Ad8 score reviewed for issues:  Issues making decisions: no  Less interest in hobbies / activities: no  Repeats questions, stories (family complaining): no  Trouble using ordinary gadgets (microwave, computer, phone):no  Forgets the month or year: no  Mismanaging finances: no  Remembering appts: no  Daily problems with thinking and/or memory: no Ad8 score is= 0    Immunization History  Administered Date(s) Administered  . Influenza Split 06/01/2012  . Influenza Whole 04/02/2010, 03/18/2011  . Influenza,inj,Quad PF,36+ Mos 04/24/2013, 03/28/2014, 08/05/2015, 08/01/2016  . Td 09/10/2009   Screening Tests Health Maintenance  Topic Date Due  . Hepatitis C Screening  [birth year]  . HIV Screening  02/24/1972  . PAP SMEAR  02/23/1978  . INFLUENZA VACCINE  02/08/2017  . MAMMOGRAM  11/30/2017  . Fecal DNA (Cologuard)  11/10/2018  . TETANUS/TDAP  09/11/2019      Plan:    Continue to eat heart healthy diet (full of fruits, vegetables, whole grains, lean protein, water--limit salt, fat, and sugar intake) and increase physical activity as tolerated.  I have personally reviewed and noted the following in the patient's chart:   . Medical and social history . Use of alcohol, tobacco or illicit drugs  . Current medications and supplements . Functional ability and status . Nutritional status . Physical activity . Advanced  directives . List of other physicians . Vitals . Screenings to include cognitive, depression, and falls . Referrals and appointments  In addition, I have reviewed and discussed with patient certain preventive protocols, quality metrics, and best practice recommendations. A written personalized care plan for preventive services as well as general preventive health recommendations were provided to patient.     Michiel Cowboy, RN  02/24/2017  Medical screening examination/treatment/procedure(s) were performed by non-physician practitioner and as supervising physician I was immediately available for consultation/collaboration. I agree with above. Walker Kehr, MD

## 2017-02-24 NOTE — Patient Instructions (Addendum)
Continue to eat heart healthy diet (full of fruits, vegetables, whole grains, lean protein, water--limit salt, fat, and sugar intake) and increase physical activity as tolerated.   Danielle [name] , Thank you for taking time to come for your Medicare Wellness Visit. I appreciate your ongoing commitment to your health goals. Please review the following plan we discussed and let me know if I can assist you in the future.   These are the goals we discussed: Goals    . lose weight          Increase my physical activity by walking, use portion control and eat as healthy as possible.       This is a list of the screening recommended for you and due dates:  Health Maintenance  Topic Date Due  .  Hepatitis C: One time screening is recommended by Center for Disease Control  (CDC) for  adults born from [birth year] through 1964.   [birth year]  . HIV Screening  02/24/1972  . Pap Smear  02/23/1978  . Flu Shot  02/08/2017  . Mammogram  11/30/2017  . Cologuard (Stool DNA test)  11/10/2018  . Tetanus Vaccine  09/11/2019   High-Protein and High-Calorie Diet Eating high-protein and high-calorie foods can help you to gain weight, heal after an injury, and recover after an illness or surgery. What is my plan? The specific amount of daily protein and calories you need depends on:  Your body weight.  The reason this diet is recommended for you.  Generally, a high-protein, high-calorie diet involves:  Eating 250-500 extra calories each day.  Making sure that 10-35% of your daily calories come from protein.  Talk to your health care provider about how much protein and how many calories you need each day. Follow the diet as directed by your health care provider. What do I need to know about this diet?  Ask your health care provider if you should take a nutritional supplement.  Try to eat six small meals each day instead of three large meals.  Eat a balanced diet, including one food that is high in protein at  each meal.  Keep nutritious snacks handy, such as nuts, trail mixes, dried fruit, and yogurt.  If you have kidney disease or diabetes, eating too much protein may put extra stress on your kidneys. Talk to your health care provider if you have either of those conditions. What are some high-protein foods? Grains Quinoa. Bulgur wheat. Vegetables Soybeans. Peas. Meats and Other Protein Sources Beef, pork, and poultry. Fish and seafood. Eggs. Tofu. Textured vegetable protein (TVP). Peanut butter. Nuts and seeds. Dried beans. Protein powders. Dairy Whole milk. Whole-milk yogurt. Powdered milk. Cheese. Yahoo. Eggnog. Beverages High-protein supplement drinks. Soy milk. Other Protein bars. The items listed above may not be a complete list of recommended foods or beverages. Contact your dietitian for more options. What are some high-calorie foods? Grains Pasta. Quick breads. Muffins. Pancakes. Ready-to-eat cereal. Vegetables Vegetables cooked in oil or butter. Fried potatoes. Fruits Dried fruit. Fruit leather. Canned fruit in syrup. Fruit juice. Avocados. Meats and Other Protein Sources Peanut butter. Nuts and seeds. Dairy Heavy cream. Whipped cream. Cream cheese. Sour cream. Ice cream. Custard. Pudding. Beverages Meal-replacement beverages. Nutrition shakes. Fruit juice. Sugar-sweetened soft drinks. Condiments Salad dressing. Mayonnaise. Alfredo sauce. Fruit preserves or jelly. Honey. Syrup. Sweets/Desserts Cake. Cookies. Pie. Pastries. Candy bars. Chocolate. Fats and Oils Butter or margarine. Oil. Gravy. Other Meal-replacement bars. The items listed above may not be a complete list  of recommended foods or beverages. Contact your dietitian for more options. What are some tips for including high-protein and high-calorie foods in my diet?  Add whole milk, half-and-half, or heavy cream to cereal, pudding, soup, or hot cocoa.  Add whole milk to instant breakfast  drinks.  Add peanut butter to oatmeal or smoothies.  Add powdered milk to baked goods, smoothies, or milkshakes.  Add powdered milk, cream, or butter to mashed potatoes.  Add cheese to cooked vegetables.  Make whole-milk yogurt parfaits. Top them with granola, fruit, or nuts.  Add cottage cheese to your fruit.  Add avocados, cheese, or both to sandwiches or salads.  Add meat, poultry, or seafood to rice, pasta, casseroles, salads, and soups.  Use mayonnaise when making egg salad, chicken salad, or tuna salad.  Use peanut butter as a topping for pretzels, celery, or crackers.  Add beans to casseroles, dips, and spreads.  Add pureed beans to sauces and soups.  Replace calorie-free drinks with calorie-containing drinks, such as milk and fruit juice. This information is not intended to replace advice given to you by your health care provider. Make sure you discuss any questions you have with your health care provider. Document Released: 06/27/2005 Document Revised: 12/03/2015 Document Reviewed: 12/10/2013 Elsevier Interactive Patient Education  [employer].

## 2017-02-24 NOTE — Progress Notes (Signed)
Pre visit review using our clinic review tool, if applicable. No additional management support is needed unless otherwise documented below in the visit note. 

## 2017-03-15 ENCOUNTER — Encounter: Payer: Self-pay | Admitting: Internal Medicine

## 2017-03-20 ENCOUNTER — Ambulatory Visit: Payer: Medicare Other | Admitting: Internal Medicine

## 2017-04-04 ENCOUNTER — Ambulatory Visit: Payer: Medicare Other | Admitting: Internal Medicine

## 2017-05-13 DIAGNOSIS — M654 Radial styloid tenosynovitis [de Quervain]: Secondary | ICD-10-CM | POA: Diagnosis not present

## 2017-05-16 ENCOUNTER — Ambulatory Visit (INDEPENDENT_AMBULATORY_CARE_PROVIDER_SITE_OTHER): Payer: Medicare Other | Admitting: Internal Medicine

## 2017-05-16 ENCOUNTER — Encounter: Payer: Self-pay | Admitting: Internal Medicine

## 2017-05-16 VITALS — BP 146/86 | HR 42 | Temp 98.0°F | Ht 62.0 in | Wt 160.0 lb

## 2017-05-16 DIAGNOSIS — M654 Radial styloid tenosynovitis [de Quervain]: Secondary | ICD-10-CM | POA: Diagnosis not present

## 2017-05-16 DIAGNOSIS — Z23 Encounter for immunization: Secondary | ICD-10-CM

## 2017-05-16 DIAGNOSIS — E538 Deficiency of other specified B group vitamins: Secondary | ICD-10-CM

## 2017-05-16 DIAGNOSIS — G4733 Obstructive sleep apnea (adult) (pediatric): Secondary | ICD-10-CM

## 2017-05-16 DIAGNOSIS — I10 Essential (primary) hypertension: Secondary | ICD-10-CM | POA: Diagnosis not present

## 2017-05-16 NOTE — Assessment & Plan Note (Addendum)
ACE wrap and ice Ortho f/up

## 2017-05-16 NOTE — Progress Notes (Signed)
Subjective:  Patient ID: Danielle [name], female    DOB: May 27, 1956  Age: 61 y.o. MRN: [id number]  CC: No chief complaint on file.   HPI [name] presents for R wrist pain. Pt fell 3 weeks ago on the R side. C/o R wrist pain. Went to [facility] last Sat - no fx on X ray.  Pt was given prednisone/splint. Better now. She was ref to Ortho [employer]).  Outpatient Medications Prior to Visit  Medication Sig Dispense Refill  . Cholecalciferol (VITAMIN D3) 2000 units capsule Take 1 capsule (2,000 Units total) by mouth daily. 100 capsule 3  . Cyanocobalamin (VITAMIN B-12) 1000 MCG SUBL Place 1 tablet (1,000 mcg total) under the tongue daily. 100 tablet 3  . fluocinolone (VANOS) 0.01 % cream Use once daily on scalp rash     . losartan (COZAAR) 100 MG tablet Take 1 tablet (100 mg total) by mouth daily. 90 tablet 3  . methocarbamol (ROBAXIN) 750 MG tablet     . predniSONE (DELTASONE) 20 MG tablet     . traMADol (ULTRAM) 50 MG tablet     . verapamil (CALAN-SR) 180 MG CR tablet Take 1 tablet (180 mg total) by mouth at bedtime. 90 tablet 3   No facility-administered medications prior to visit.     ROS Review of Systems  Constitutional: Negative for activity change, appetite change, chills, fatigue and unexpected weight change.  HENT: Negative for congestion, mouth sores and sinus pressure.   Eyes: Negative for visual disturbance.  Respiratory: Negative for cough and chest tightness.   Gastrointestinal: Negative for abdominal pain and nausea.  Genitourinary: Negative for difficulty urinating, frequency and vaginal pain.  Musculoskeletal: Negative for back pain and gait problem.  Skin: Negative for pallor and rash.  Neurological: Negative for dizziness, tremors, weakness, numbness and headaches.  Psychiatric/Behavioral: Negative for confusion, sleep disturbance and suicidal ideas.    Objective:  BP (!) 146/86 (BP Location: Left Arm, Patient Position: Sitting, Cuff Size: Large)    Pulse (!) 42   Temp 98 F (36.7 C) (Oral)   Ht 5\' 2"  (1.575 m)   Wt 160 lb (72.6 kg)   SpO2 98%   BMI 29.26 kg/m   BP Readings from Last 3 Encounters:  05/16/17 (!) 146/86  02/24/17 (!) 144/67  11/30/16 (!) 142/80    Wt Readings from Last 3 Encounters:  05/16/17 160 lb (72.6 kg)  02/24/17 159 lb (72.1 kg)  11/30/16 161 lb (73 kg)    Physical Exam  Constitutional: She appears well-developed. No distress.  HENT:  Head: Normocephalic.  Right Ear: External ear normal.  Left Ear: External ear normal.  Nose: Nose normal.  Mouth/Throat: Oropharynx is clear and moist.  Eyes: Conjunctivae are normal. Pupils are equal, round, and reactive to light. Right eye exhibits no discharge. Left eye exhibits no discharge.  Neck: Normal range of motion. Neck supple. No JVD present. No tracheal deviation present. No thyromegaly present.  Cardiovascular: Normal rate, regular rhythm and normal heart sounds.  Pulmonary/Chest: No stridor. No respiratory distress. She has no wheezes.  Abdominal: Soft. Bowel sounds are normal. She exhibits no distension and no mass. There is no tenderness. There is no rebound and no guarding.  Musculoskeletal: She exhibits no edema or tenderness.  Lymphadenopathy:    She has no cervical adenopathy.  Neurological: She displays normal reflexes. No cranial nerve deficit. She exhibits normal muscle tone. Coordination normal.  Skin: No rash noted. No erythema.  Psychiatric: She has  a normal mood and affect. Her behavior is normal. Judgment and thought content normal.  Obese R abductor poll longus is tender  Lab Results  Component Value Date   WBC 8.0 12/12/2016   HGB 14.5 12/12/2016   HCT 41.3 12/12/2016   PLT 310.0 12/12/2016   GLUCOSE 104 (H) 01/25/2017   CHOL 265 (H) 01/25/2017   TRIG 187.0 (H) 01/25/2017   HDL 53.20 01/25/2017   LDLDIRECT 216.8 04/24/2013   LDLCALC 175 (H) 01/25/2017   ALT 17 12/12/2016   AST 16 12/12/2016   NA 139 01/25/2017   K 4.2  01/25/2017   CL 105 01/25/2017   CREATININE 1.00 01/25/2017   BUN 20 01/25/2017   CO2 26 01/25/2017   TSH 1.37 12/12/2016   HGBA1C 5.6 12/12/2016    Mm Digital Screening Bilateral  Result Date: 12/01/2015 CLINICAL DATA:  Screening. EXAM: DIGITAL SCREENING BILATERAL MAMMOGRAM WITH CAD COMPARISON:  Previous exam(s). ACR Breast Density Category b: There are scattered areas of fibroglandular density. FINDINGS: There are no findings suspicious for malignancy. Images were processed with CAD. IMPRESSION: No mammographic evidence of malignancy. A result letter of this screening mammogram will be mailed directly to the patient. RECOMMENDATION: Screening mammogram in one year. (Code:SM-B-01Y) BI-RADS CATEGORY  1: Negative. Electronically Signed   By: Dorise Bullion III M.D   On: 12/01/2015 17:11    Assessment & Plan:   There are no diagnoses linked to this encounter. I am having Jo-Ann A. Deboy maintain her fluocinolone, Vitamin B-12, Vitamin D3, losartan, verapamil, predniSONE, methocarbamol, and traMADol.  Meds ordered this encounter  Medications  . predniSONE (DELTASONE) 20 MG tablet  . methocarbamol (ROBAXIN) 750 MG tablet  . traMADol (ULTRAM) 50 MG tablet     Follow-up: No Follow-up on file.  Walker Kehr, MD

## 2017-05-16 NOTE — Assessment & Plan Note (Signed)
Losartan, Verapamil

## 2017-05-16 NOTE — Assessment & Plan Note (Signed)
On B12 

## 2017-05-16 NOTE — Assessment & Plan Note (Signed)
Mild.

## 2017-05-16 NOTE — Patient Instructions (Signed)
De Quervain Tenosynovitis  Tendons attach muscles to bones. They also help with joint movements. When tendons become irritated or swollen, it is called tendinitis.  The extensor pollicis brevis (EPB) tendon connects the EPB muscle to a bone that is near the base of the thumb. The EPB muscle helps to straighten and extend the thumb. De Quervain tenosynovitis is a condition in which the EPB tendon lining (sheath) becomes irritated, thickened, and swollen. This condition is sometimes called stenosing tenosynovitis. This condition causes pain on the thumb side of the back of the wrist.  What are the causes?  Causes of this condition include:   Activities that repeatedly cause your thumb and wrist to extend.   A sudden increase in activity or change in activity that affects your wrist.    What increases the risk?  This condition is more likely to develop in:   Females.   People who have diabetes.   Women who have recently given birth.   People who are over 40 years of age.   People who do activities that involve repeated hand and wrist motions, such as tennis, racquetball, volleyball, gardening, and taking care of children.   People who do heavy labor.   People who have poor wrist strength and flexibility.   People who do not warm up properly before activities.    What are the signs or symptoms?  Symptoms of this condition include:   Pain or tenderness over the thumb side of the back of the wrist when your thumb and wrist are not moving.   Pain that gets worse when you straighten your thumb or extend your thumb or wrist.   Pain when the injured area is touched.   Locking or catching of the thumb joint while you bend and straighten your thumb.   Decreased thumb motion due to pain.   Swelling over the affected area.    How is this diagnosed?  This condition is diagnosed with a medical history and physical exam. Your health care provider will ask for details about your injury and ask about your  symptoms.  How is this treated?  Treatment may include the use of icing and medicines to reduce pain and swelling. You may also be advised to wear a splint or brace to limit your thumb and wrist motion. In less severe cases, treatment may also include working with a physical therapist to strengthen your wrist and calm the irritation around your EPB tendon sheath. In severe cases, surgery may be needed.  Follow these instructions at home:  If you have a splint or brace:   Wear it as told by your health care provider. Remove it only as told by your health care provider.   Loosen the splint or brace if your fingers become numb and tingle, or if they turn cold and blue.   Keep the splint or brace clean and dry.  Managing pain, stiffness, and swelling   If directed, apply ice to the injured area.  ? Put ice in a plastic bag.  ? Place a towel between your skin and the bag.  ? Leave the ice on for 20 minutes, 2-3 times per day.   Move your fingers often to avoid stiffness and to lessen swelling.   Raise (elevate) the injured area above the level of your heart while you are sitting or lying down.  General instructions   Return to your normal activities as told by your health care provider. Ask your health care provider   what activities are safe for you.   Take over-the-counter and prescription medicines only as told by your health care provider.   Keep all follow-up visits as told by your health care provider. This is important.   Do not drive or operate heavy machinery while taking prescription pain medicine.  Contact a health care provider if:   Your pain, tenderness, or swelling gets worse, even if you have had treatment.   You have numbness or tingling in your wrist, hand, or fingers on the injured side.  This information is not intended to replace advice given to you by your health care provider. Make sure you discuss any questions you have with your health care provider.  Document Released: 06/27/2005  Document Revised: 12/03/2015 Document Reviewed: 09/02/2014  Elsevier Interactive Patient Education  2018 [employer]

## 2017-05-29 ENCOUNTER — Telehealth: Payer: Self-pay | Admitting: Internal Medicine

## 2017-05-29 NOTE — Telephone Encounter (Signed)
Patient Name: Danielle [name]  DOB: 1956/03/18    Initial Comment Caller states she had a pain in her chest, got hot and vomited.   Nurse Assessment  Nurse: Raphael Gibney, RN, Vera Date/Time Eilene Ghazi Time): 05/29/2017 1:10:19 PM  Confirm and document reason for call. If symptomatic, describe symptoms. ---Caller states she had some chest pain. Did not take her amlodipine. She got really hot and then vomited. She is still nauseated. Chest pain lasted about 15-20 min. Happened about 7:30 am.  Does the patient have any new or worsening symptoms? ---Yes  Will a triage be completed? ---Yes  Related visit to physician within the last 2 weeks? ---No  Does the PT have any chronic conditions? (i.e. diabetes, asthma, etc.) ---Yes  List chronic conditions. ---HTN  Is this a behavioral health or substance abuse call? ---No     Guidelines    Guideline Title Affirmed Question Affirmed Notes  Chest Pain Chest pain lasts > 5 minutes (Exceptions: chest pain occurring > 3 days ago and now asymptomatic; same as previously diagnosed heartburn and has accompanying sour taste in mouth)    Final Disposition User   Go to ED Now (or PCP triage) Raphael Gibney, RN, Vanita Ingles    Comments  Pt does not want to go to the ER as chest pain happened about 7:30 am.  Called back line and spoke to [place] and gave report triage outcome of go to ER now but pt does not want to go.   Referrals  GO TO FACILITY REFUSED   Caller Disagree/Comply Disagree  Caller Understands Yes  PreDisposition Call Doctor

## 2017-05-29 NOTE — Telephone Encounter (Signed)
pls sch w/available provider asap Thx

## 2017-05-29 NOTE — Telephone Encounter (Signed)
Please advise if okay to schedule patient.

## 2017-05-31 DIAGNOSIS — R112 Nausea with vomiting, unspecified: Secondary | ICD-10-CM | POA: Diagnosis not present

## 2017-05-31 DIAGNOSIS — K2970 Gastritis, unspecified, without bleeding: Secondary | ICD-10-CM | POA: Diagnosis not present

## 2017-05-31 DIAGNOSIS — K5900 Constipation, unspecified: Secondary | ICD-10-CM | POA: Diagnosis not present

## 2017-05-31 DIAGNOSIS — I10 Essential (primary) hypertension: Secondary | ICD-10-CM | POA: Diagnosis not present

## 2017-05-31 DIAGNOSIS — R51 Headache: Secondary | ICD-10-CM | POA: Diagnosis not present

## 2017-05-31 DIAGNOSIS — I160 Hypertensive urgency: Secondary | ICD-10-CM | POA: Diagnosis not present

## 2017-05-31 DIAGNOSIS — R079 Chest pain, unspecified: Secondary | ICD-10-CM | POA: Diagnosis not present

## 2017-05-31 NOTE — Telephone Encounter (Signed)
Patient called back and said she is going to go to the urgent care in [facility] where she lives. Once she knows what is going on she will have them send [place] the records of the visit.

## 2017-05-31 NOTE — Telephone Encounter (Signed)
Spoke with patient.  States she is no longer having chest pain.  States that she can not come in today because she has to cook for tomorrow.  States she can be scheduled for [facility] or something next week.  States if chest pain starts up again she will go to ED.

## 2017-05-31 NOTE — Telephone Encounter (Signed)
Called patient.  She is actually at ED.

## 2017-06-05 ENCOUNTER — Ambulatory Visit: Payer: Medicare Other | Admitting: Internal Medicine

## 2017-06-05 NOTE — Telephone Encounter (Signed)
Pt called asking to speak with you. She said that she was seen at [facility]. She did not know if we would have access to their records or if they would need to be requested.

## 2017-06-05 NOTE — Telephone Encounter (Signed)
Spoke with pt to let her know. She said that she would contact the hospital and have them send the records to [place].

## 2017-06-05 NOTE — Telephone Encounter (Signed)
Pt would have to have [facility] send them to [place].

## 2017-06-15 ENCOUNTER — Encounter: Payer: Self-pay | Admitting: Internal Medicine

## 2017-06-15 ENCOUNTER — Ambulatory Visit (INDEPENDENT_AMBULATORY_CARE_PROVIDER_SITE_OTHER): Payer: Medicare Other | Admitting: Internal Medicine

## 2017-06-15 VITALS — BP 148/90 | HR 78 | Temp 97.8°F | Ht 62.0 in | Wt 155.0 lb

## 2017-06-15 DIAGNOSIS — I10 Essential (primary) hypertension: Secondary | ICD-10-CM | POA: Diagnosis not present

## 2017-06-15 DIAGNOSIS — R0789 Other chest pain: Secondary | ICD-10-CM

## 2017-06-15 DIAGNOSIS — M79641 Pain in right hand: Secondary | ICD-10-CM

## 2017-06-15 MED ORDER — METHYLPREDNISOLONE ACETATE 40 MG/ML IJ SUSP
10.0000 mg | Freq: Once | INTRAMUSCULAR | Status: AC
  Administered 2017-06-15: 10 mg via INTRA_ARTICULAR

## 2017-06-15 MED ORDER — BUMETANIDE 0.5 MG PO TABS: 0.5000 mg | ORAL_TABLET | Freq: Every day | ORAL | 11 refills | Status: DC

## 2017-06-15 NOTE — Patient Instructions (Addendum)
Use [employer] Peroxicare tooth paste   A Band-Aid should be left on for 12 hours. Injection therapy is not a cure itself. It is used in conjunction with other modalities. You can use Tylenol if needed. You need to report immediately  if fever, chills or any signs of infection develop.

## 2017-06-15 NOTE — Progress Notes (Signed)
Subjective:  Patient ID: Danielle [name], female    DOB: 1955-09-06  Age: 61 y.o. MRN: [id number]  CC: No chief complaint on file.   HPI Danielle [name] presents for HTN (SBP>220) on 11/21 w/epig pain. The pt went to ER [facility]) w/a dx of gastritis. Stress test was scheduled...  C/o R hand pain - not better  Outpatient Medications Prior to Visit  Medication Sig Dispense Refill  . Cholecalciferol (VITAMIN D3) 2000 units capsule Take 1 capsule (2,000 Units total) by mouth daily. 100 capsule 3  . Cyanocobalamin (VITAMIN B-12) 1000 MCG SUBL Place 1 tablet (1,000 mcg total) under the tongue daily. 100 tablet 3  . famotidine (PEPCID) 40 MG tablet     . fluocinolone (VANOS) 0.01 % cream Use once daily on scalp rash     . losartan (COZAAR) 100 MG tablet Take 1 tablet (100 mg total) by mouth daily. 90 tablet 3  . methocarbamol (ROBAXIN) 750 MG tablet     . predniSONE (DELTASONE) 20 MG tablet     . traMADol (ULTRAM) 50 MG tablet     . verapamil (CALAN-SR) 180 MG CR tablet Take 1 tablet (180 mg total) by mouth at bedtime. 90 tablet 3   No facility-administered medications prior to visit.     ROS Review of Systems  Constitutional: Negative for activity change, appetite change, chills, fatigue and unexpected weight change.  HENT: Negative for congestion, mouth sores and sinus pressure.   Eyes: Negative for visual disturbance.  Respiratory: Negative for cough and chest tightness.   Gastrointestinal: Negative for abdominal pain and nausea.  Genitourinary: Negative for difficulty urinating, frequency and vaginal pain.  Musculoskeletal: Positive for arthralgias. Negative for back pain and gait problem.  Skin: Negative for pallor and rash.  Neurological: Negative for dizziness, tremors, weakness, numbness and headaches.  Psychiatric/Behavioral: Negative for confusion and sleep disturbance.    Objective:  BP (!) 148/90 (BP Location: Left Arm, Patient Position: Sitting, Cuff Size: Large)   Pulse  78   Temp 97.8 F (36.6 C) (Oral)   Ht 5\' 2"  (1.575 m)   Wt 155 lb (70.3 kg)   SpO2 98%   BMI 28.35 kg/m   BP Readings from Last 3 Encounters:  06/15/17 (!) 148/90  05/16/17 (!) 146/86  02/24/17 (!) 144/67    Wt Readings from Last 3 Encounters:  06/15/17 155 lb (70.3 kg)  05/16/17 160 lb (72.6 kg)  02/24/17 159 lb (72.1 kg)    Physical Exam  Constitutional: She appears well-developed. No distress.  HENT:  Head: Normocephalic.  Right Ear: External ear normal.  Left Ear: External ear normal.  Nose: Nose normal.  Mouth/Throat: Oropharynx is clear and moist.  Eyes: Conjunctivae are normal. Pupils are equal, round, and reactive to light. Right eye exhibits no discharge. Left eye exhibits no discharge.  Neck: Normal range of motion. Neck supple. No JVD present. No tracheal deviation present. No thyromegaly present.  Cardiovascular: Normal rate, regular rhythm and normal heart sounds.  Pulmonary/Chest: No stridor. No respiratory distress. She has no wheezes.  Abdominal: Soft. Bowel sounds are normal. She exhibits no distension and no mass. There is no tenderness. There is no rebound and no guarding.  Musculoskeletal: She exhibits no edema or tenderness.  Lymphadenopathy:    She has no cervical adenopathy.  Neurological: She displays normal reflexes. No cranial nerve deficit. She exhibits normal muscle tone. Coordination normal.  Skin: No rash noted. No erythema.  Psychiatric: She has a normal mood and  affect. Her behavior is normal. Judgment and thought content normal.   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 Informed consent was obtained and signed.   Tthe patient was placed in a comfortable position.  abductor pollucis longus tendon was  marked and  the skin was prepped with Betadine and alcohol. 1/2 inch 27-gauge needle was used. The needle was advanced  into the skin down to tendon. It  was injected with 1 mL of 2% lidocaine and 20 mg of Depo-Medrol in a usual fashion.  Band-Aid was  Applied. ACE wrap.   Tolerated well. Complications: None. Good pain relief following the procedure.  A Band-Aid should be left on for 12 hours. Injection therapy is not a cure itself. It is used in conjunction with other modalities. You can use Tylenol if needed. You need to report immediately  if fever, chills or any signs of infection develop.  Lab Results  Component Value Date   WBC 8.0 12/12/2016   HGB 14.5 12/12/2016   HCT 41.3 12/12/2016   PLT 310.0 12/12/2016   GLUCOSE 104 (H) 01/25/2017   CHOL 265 (H) 01/25/2017   TRIG 187.0 (H) 01/25/2017   HDL 53.20 01/25/2017   LDLDIRECT 216.8 04/24/2013   LDLCALC 175 (H) 01/25/2017   ALT 17 12/12/2016   AST 16 12/12/2016   NA 139 01/25/2017   K 4.2 01/25/2017   CL 105 01/25/2017   CREATININE 1.00 01/25/2017   BUN 20 01/25/2017   CO2 26 01/25/2017   TSH 1.37 12/12/2016   HGBA1C 5.6 12/12/2016    Mm Digital Screening Bilateral  Result Date: 12/01/2015 CLINICAL DATA:  Screening. EXAM: DIGITAL SCREENING BILATERAL MAMMOGRAM WITH CAD COMPARISON:  Previous exam(s). ACR Breast Density Category b: There are scattered areas of fibroglandular density. FINDINGS: There are no findings suspicious for malignancy. Images were processed with CAD. IMPRESSION: No mammographic evidence of malignancy. A result letter of this screening mammogram will be mailed directly to the patient. RECOMMENDATION: Screening mammogram in one year. (Code:SM-B-01Y) BI-RADS CATEGORY  1: Negative. Electronically Signed   By: Dorise Bullion III M.D   On: 12/01/2015 17:11    Assessment & Plan:   There are no diagnoses linked to this encounter. I am having Danielle [name] maintain her fluocinolone, Vitamin B-12, Vitamin D3,  losartan, verapamil, predniSONE, methocarbamol, traMADol, and famotidine.  No orders of the defined types were placed in this encounter.    Follow-up: No Follow-up on file.  Walker Kehr, MD

## 2017-06-15 NOTE — Assessment & Plan Note (Signed)
HTN (SBP>220) on 11/21 w/epig pain. The pt went to ER [facility]) w/a dx of gastritis. Stress test was scheduled.

## 2017-06-15 NOTE — Assessment & Plan Note (Signed)
HTN (SBP>220) on 11/21 w/epig pain. The pt went to ER [facility]) w/a dx of gastritis. Stress test was scheduled.  BP Readings from Last 3 Encounters:  06/15/17 (!) 148/90  05/16/17 (!) 146/86  02/24/17 (!) 144/67

## 2017-07-12 DIAGNOSIS — R079 Chest pain, unspecified: Secondary | ICD-10-CM | POA: Diagnosis not present

## 2017-07-17 ENCOUNTER — Ambulatory Visit: Payer: Self-pay | Admitting: *Deleted

## 2017-07-17 NOTE — Telephone Encounter (Signed)
Pt calling to see what else she could take to help relieve hip pain. Pt states she had been taking Motrin and Aleve previously which did help with pain, but she was told to stop taking because she began to have issues with her stomach. Pt states she has been using a heating pad and has been taking Tylenol but has not had much relief. Pt asking if she could have an infection for pain. Pt has OV scheduled for 07/31/16 with Dr. Alain Marion. Reason for Disposition . Hip pain  Answer Assessment - Initial Assessment Questions 1. LOCATION and RADIATION: "Where is the pain located?"      Right hip 2. QUALITY: "What does the pain feel like?"  (e.g., sharp, dull, aching, burning)     Aching 3. SEVERITY: "How bad is the pain?" "What does it keep you from doing?"   (Scale 1-10; or mild, moderate, severe)   -  MILD (1-3): doesn't interfere with normal activities    -  MODERATE (4-7): interferes with normal activities (e.g., work or school) or awakens from sleep, limping    -  SEVERE (8-10): excruciating pain, unable to do any normal activities, unable to walk     Pain 8 4. ONSET: "When did the pain start?" "Does it come and go, or is it there all the time?"    Progressive as day goes on  5. WORK OR EXERCISE: "Has there been any recent work or exercise that involved this part of the body?"      No 6. CAUSE: "What do you think is causing the hip pain?"      Previous hip pain 7. AGGRAVATING FACTORS: "What makes the hip pain worse?" (e.g., walking, climbing stairs, running)     Yes with walking 8. OTHER SYMPTOMS: "Do you have any other symptoms?" (e.g., back pain, pain shooting down leg,  fever, rash)     No  Protocols used: HIP PAIN-A-AH

## 2017-07-19 NOTE — Telephone Encounter (Signed)
Please advise 

## 2017-07-20 ENCOUNTER — Telehealth: Payer: Self-pay | Admitting: Internal Medicine

## 2017-07-20 ENCOUNTER — Encounter: Payer: Self-pay | Admitting: Family Medicine

## 2017-07-20 ENCOUNTER — Ambulatory Visit (INDEPENDENT_AMBULATORY_CARE_PROVIDER_SITE_OTHER): Payer: Medicare Other | Admitting: Family Medicine

## 2017-07-20 VITALS — BP 136/78 | HR 64 | Temp 97.8°F | Ht 62.0 in | Wt 155.0 lb

## 2017-07-20 DIAGNOSIS — M7061 Trochanteric bursitis, right hip: Secondary | ICD-10-CM | POA: Insufficient documentation

## 2017-07-20 NOTE — Patient Instructions (Signed)
Please try the exercises  Please follow up with me if your symptoms do not improve.

## 2017-07-20 NOTE — Telephone Encounter (Signed)
BP was not discussed during visit, please advise about f/u

## 2017-07-20 NOTE — Telephone Encounter (Signed)
Patient was here today to see Dr Raeford Razor. She wanted to know if she needed to keep her upcoming appointment with Dr Alain Marion on 07/31/16 (6 wk fu) or if since she was seen today, we could cancel it? Please advise. She said at her last visit he changed some of her medications due to blood pressure and since the change she seems to be doing much better. BP today was 136/78.

## 2017-07-20 NOTE — Assessment & Plan Note (Signed)
Likely has trochanteric bursitis as well as gluteus medius syndrome occurring. Significant weakness with hip abduction. Does not appear to be intra-articular nature. Possible SI joint. Doesn't appear to be sciatic in nature - Greater troch injection today - Counseled on home exercise therapy - Referral to physical therapy

## 2017-07-20 NOTE — Progress Notes (Signed)
Danielle [name] - 62 y.o. female MRN [id number]  Date of birth: 08-18-1955  SUBJECTIVE:  Including CC & ROS.  Chief Complaint  Patient presents with  . Hip Pain    Danielle [name] is a 62 y.o. female that is presenting with right hip pain. Pain has been ongoing for more than a year. She has previously received Cortisone injection in her left greater troch and this pain feels similar to that. She has had numerous falls in the past two months. She has more than one instance falling while out shopping. She lost her balance, landed on her back. Pain is described as constant ache, located at her joint and radiates down to her knee.  The pain start in the buttock and lateral right hip. Has not tried anything for the pain. Pain is sharp in nature. Pain is moderate to severe. Pain is worse with prolonged standing and bending over.   Independent review of the CT pelvis from 2007 does not show any degenerative changes of the hips.     Review of Systems  Constitutional: Negative for fever.  Eyes: Negative for visual disturbance.  Respiratory: Negative for cough.   Cardiovascular: Negative for chest pain.  Gastrointestinal: Negative for abdominal pain.  Musculoskeletal: Negative for gait problem.  Skin: Negative for color change.  Neurological: Negative for weakness.  Hematological: Negative for adenopathy.  Psychiatric/Behavioral: Negative for agitation.    HISTORY: Past Medical, Surgical, Social, and Family History Reviewed & Updated per EMR.   Pertinent Historical Findings include:  Past Medical History:  Diagnosis Date  . Acute sinusitis, unspecified 10/24/2008  . ALLERGIC RHINITIS 10/24/2008  . BRONCHITIS, ACUTE 06/13/2008  . Cough 06/13/2008  . DYSPNEA 05/07/2010  . FOOT PAIN 04/02/2010  . GERD 02/19/2007  . 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 HYPERTENSION 02/15/2008  . INSOMNIA, PERSISTENT 02/15/2008  . INTRAABDOMINAL ABSCESS 02/19/2007  . MOOD DISORDER 02/20/2009  . OBESITY  02/20/2009  . TOBACCO USE, QUIT 09/10/2009    Past Surgical History:  Procedure Laterality Date  . ABDOMINAL HYSTERECTOMY    . CHOLECYSTECTOMY    . ERCP      Allergies  Allergen Reactions  . Amlodipine Besylate     REACTION: depressed and teary  . Amoxicillin     Upset stomach  . Benazepril Hcl     REACTION: cough  . Carvedilol     REACTION: fatigue  . Dexilant [Dexlansoprazole]     Dry mouth   . Hydrochlorothiazide   . Olmesartan Medoxomil     REACTION: sinus congestion  . Omeprazole     Sore tongue  . Pepcid [Famotidine]   . Phentermine     Angry, constipation  . Spironolactone     REACTION: abd pain  . Sulfonamide Derivatives     Family History  Problem Relation Age of Onset  . Hypertension Mother   . Hypertension Father   . Hypertension Other      Social History   Socioeconomic History  . Marital status: Married    Spouse name: Not on file  . Number of children: Not on file  . Years of education: Not on file  . Highest education level: Not on file  Social Needs  . Financial resource strain: Not on file  . Food insecurity - worry: Not on file  . Food insecurity - inability: Not on file  . Transportation needs - medical: Not on file  . Transportation needs - non-medical: Not on file  Occupational History  .  Occupation: disabled for incis. hernias  Tobacco Use  . Smoking status: Former Research scientist (life sciences)  . Smokeless tobacco: Never Used  Substance and Sexual Activity  . Alcohol use: No  . Drug use: No  . Sexual activity: Yes  Other Topics Concern  . Not on file  Social History Narrative  . Not on file     PHYSICAL EXAM:  VS: BP 136/78 (BP Location: Left Arm, Patient Position: Sitting, Cuff Size: Normal)   Pulse 64   Temp 97.8 F (36.6 C) (Oral) Comment: 97.8  Ht 5\' 2"  (1.575 m)   Wt 155 lb (70.3 kg)   SpO2 97%   BMI 28.35 kg/m  Physical Exam Gen: NAD, alert, cooperative with exam, well-appearing ENT: normal lips, normal nasal mucosa,  Eye:  normal EOM, normal conjunctiva and lids CV:  no edema, +2 pedal pulses   Resp: no accessory muscle use, non-labored,  Skin: no rashes, no areas of induration  Neuro: normal tone, normal sensation to touch Psych:  normal insight, alert and oriented MSK:  Right hip:  TTP of the GT  No TTP of the SI joint, piriformis, or lumbar spine  Normal IR and ER  Significant weakness with hip abduction  Normal strength to resistance with hip flexion  Normal knee flexion and extension  Negative SLR b/l  Neurovascularly intact    Aspiration/Injection Procedure Note Danielle [name] [birth year]  Procedure: Injection Indications: right hip pain   Procedure Details Consent: Risks of procedure as well as the alternatives and risks of each were explained to the (patient/caregiver).  Consent for procedure obtained. Time Out: Verified patient identification, verified procedure, site/side was marked, verified correct patient position, special equipment/implants available, medications/allergies/relevent history reviewed, required imaging and test results available.  Performed.  The area was cleaned with iodine and alcohol swabs.    The right GT was injected using 1 cc's of 40 mg Depomedrol and 4 cc's of 1% lidocaine with a 25 1 1/2" needle.   A sterile dressing was applied.  Patient did tolerate procedure well.    ASSESSMENT & PLAN:   Greater trochanteric bursitis of right hip Likely has trochanteric bursitis as well as gluteus medius syndrome occurring. Significant weakness with hip abduction. Does not appear to be intra-articular nature. Possible SI joint. Doesn't appear to be sciatic in nature - Greater troch injection today - Counseled on home exercise therapy - Referral to physical therapy

## 2017-07-21 NOTE — Telephone Encounter (Signed)
Ok to re-sch in 3 mo Thx

## 2017-07-22 ENCOUNTER — Other Ambulatory Visit: Payer: Self-pay | Admitting: Internal Medicine

## 2017-07-26 ENCOUNTER — Telehealth: Payer: Self-pay | Admitting: Family Medicine

## 2017-07-26 NOTE — Telephone Encounter (Signed)
Pt called stating that she would like for her physical therapy referral to be sent to [place] because this would be closer for her.

## 2017-07-26 NOTE — Telephone Encounter (Signed)
Please call pt to reschedule.

## 2017-07-27 NOTE — Telephone Encounter (Signed)
Referral faxed

## 2017-07-31 ENCOUNTER — Ambulatory Visit: Payer: Medicare Other | Admitting: Internal Medicine

## 2017-08-18 DIAGNOSIS — I10 Essential (primary) hypertension: Secondary | ICD-10-CM | POA: Diagnosis not present

## 2017-08-18 DIAGNOSIS — Z79899 Other long term (current) drug therapy: Secondary | ICD-10-CM | POA: Diagnosis not present

## 2017-08-18 DIAGNOSIS — M7061 Trochanteric bursitis, right hip: Secondary | ICD-10-CM | POA: Diagnosis not present

## 2017-08-21 DIAGNOSIS — M7061 Trochanteric bursitis, right hip: Secondary | ICD-10-CM | POA: Diagnosis not present

## 2017-08-21 DIAGNOSIS — Z79899 Other long term (current) drug therapy: Secondary | ICD-10-CM | POA: Diagnosis not present

## 2017-08-21 DIAGNOSIS — I10 Essential (primary) hypertension: Secondary | ICD-10-CM | POA: Diagnosis not present

## 2017-08-24 DIAGNOSIS — M7061 Trochanteric bursitis, right hip: Secondary | ICD-10-CM | POA: Diagnosis not present

## 2017-08-24 DIAGNOSIS — I10 Essential (primary) hypertension: Secondary | ICD-10-CM | POA: Diagnosis not present

## 2017-08-24 DIAGNOSIS — Z79899 Other long term (current) drug therapy: Secondary | ICD-10-CM | POA: Diagnosis not present

## 2017-09-04 DIAGNOSIS — Z79899 Other long term (current) drug therapy: Secondary | ICD-10-CM | POA: Diagnosis not present

## 2017-09-04 DIAGNOSIS — I10 Essential (primary) hypertension: Secondary | ICD-10-CM | POA: Diagnosis not present

## 2017-09-04 DIAGNOSIS — M7061 Trochanteric bursitis, right hip: Secondary | ICD-10-CM | POA: Diagnosis not present

## 2017-09-11 DIAGNOSIS — I10 Essential (primary) hypertension: Secondary | ICD-10-CM | POA: Diagnosis not present

## 2017-09-11 DIAGNOSIS — M7061 Trochanteric bursitis, right hip: Secondary | ICD-10-CM | POA: Diagnosis not present

## 2017-09-11 DIAGNOSIS — Z79899 Other long term (current) drug therapy: Secondary | ICD-10-CM | POA: Diagnosis not present

## 2017-09-15 DIAGNOSIS — I10 Essential (primary) hypertension: Secondary | ICD-10-CM | POA: Diagnosis not present

## 2017-09-15 DIAGNOSIS — Z79899 Other long term (current) drug therapy: Secondary | ICD-10-CM | POA: Diagnosis not present

## 2017-09-15 DIAGNOSIS — M7061 Trochanteric bursitis, right hip: Secondary | ICD-10-CM | POA: Diagnosis not present

## 2017-09-18 DIAGNOSIS — M7061 Trochanteric bursitis, right hip: Secondary | ICD-10-CM | POA: Diagnosis not present

## 2017-09-18 DIAGNOSIS — I10 Essential (primary) hypertension: Secondary | ICD-10-CM | POA: Diagnosis not present

## 2017-09-18 DIAGNOSIS — Z79899 Other long term (current) drug therapy: Secondary | ICD-10-CM | POA: Diagnosis not present

## 2017-09-22 DIAGNOSIS — Z79899 Other long term (current) drug therapy: Secondary | ICD-10-CM | POA: Diagnosis not present

## 2017-09-22 DIAGNOSIS — I10 Essential (primary) hypertension: Secondary | ICD-10-CM | POA: Diagnosis not present

## 2017-09-22 DIAGNOSIS — M7061 Trochanteric bursitis, right hip: Secondary | ICD-10-CM | POA: Diagnosis not present

## 2017-09-25 DIAGNOSIS — I10 Essential (primary) hypertension: Secondary | ICD-10-CM | POA: Diagnosis not present

## 2017-09-25 DIAGNOSIS — Z79899 Other long term (current) drug therapy: Secondary | ICD-10-CM | POA: Diagnosis not present

## 2017-09-25 DIAGNOSIS — M7061 Trochanteric bursitis, right hip: Secondary | ICD-10-CM | POA: Diagnosis not present

## 2017-09-28 DIAGNOSIS — Z79899 Other long term (current) drug therapy: Secondary | ICD-10-CM | POA: Diagnosis not present

## 2017-09-28 DIAGNOSIS — M7061 Trochanteric bursitis, right hip: Secondary | ICD-10-CM | POA: Diagnosis not present

## 2017-09-28 DIAGNOSIS — I10 Essential (primary) hypertension: Secondary | ICD-10-CM | POA: Diagnosis not present

## 2017-11-13 ENCOUNTER — Encounter: Payer: Self-pay | Admitting: Internal Medicine

## 2017-11-13 ENCOUNTER — Ambulatory Visit (INDEPENDENT_AMBULATORY_CARE_PROVIDER_SITE_OTHER): Payer: Medicare Other | Admitting: Internal Medicine

## 2017-11-13 DIAGNOSIS — E538 Deficiency of other specified B group vitamins: Secondary | ICD-10-CM | POA: Diagnosis not present

## 2017-11-13 DIAGNOSIS — L578 Other skin changes due to chronic exposure to nonionizing radiation: Secondary | ICD-10-CM

## 2017-11-13 DIAGNOSIS — I10 Essential (primary) hypertension: Secondary | ICD-10-CM

## 2017-11-13 MED ORDER — MOMETASONE FUROATE 0.1 % EX SOLN: CUTANEOUS | 1 refills | Status: DC

## 2017-11-13 NOTE — Assessment & Plan Note (Signed)
On B12 

## 2017-11-13 NOTE — Assessment & Plan Note (Signed)
Verapamil 5/60

## 2017-11-13 NOTE — Assessment & Plan Note (Signed)
?  Verapamil related Elocone lotion Long sleeves

## 2017-11-13 NOTE — Progress Notes (Signed)
Subjective:  Patient ID: Danielle [name], female    DOB: 05-Jul-1956  Age: 62 y.o. MRN: [id number]  CC: No chief complaint on file.   HPI Sayuri A Longest presents for HTN Pt ran out of Verapamil - felt better off of it (stiffness, achy)  Outpatient Medications Prior to Visit  Medication Sig Dispense Refill  . bumetanide (BUMEX) 0.5 MG tablet Take 1 tablet (0.5 mg total) by mouth daily. 30 tablet 11  . Cholecalciferol (VITAMIN D3) 2000 units capsule Take 1 capsule (2,000 Units total) by mouth daily. 100 capsule 3  . Cyanocobalamin (VITAMIN B-12) 1000 MCG SUBL Place 1 tablet (1,000 mcg total) under the tongue daily. 100 tablet 3  . famotidine (PEPCID) 40 MG tablet     . fluocinolone (VANOS) 0.01 % cream Use once daily on scalp rash     . losartan (COZAAR) 100 MG tablet Take 1 tablet (100 mg total) by mouth daily. 90 tablet 3  . verapamil (CALAN-SR) 180 MG CR tablet Take 1 tablet (180 mg total) by mouth at bedtime. 90 tablet 3   No facility-administered medications prior to visit.     ROS Review of Systems  Constitutional: Positive for unexpected weight change. Negative for activity change, appetite change, chills and fatigue.  HENT: Negative for congestion, mouth sores and sinus pressure.   Eyes: Negative for visual disturbance.  Respiratory: Negative for cough and chest tightness.   Gastrointestinal: Negative for abdominal pain and nausea.  Genitourinary: Negative for difficulty urinating, frequency and vaginal pain.  Musculoskeletal: Positive for arthralgias and back pain. Negative for gait problem.  Skin: Negative for pallor and rash.  Neurological: Negative for dizziness, tremors, weakness, numbness and headaches.  Psychiatric/Behavioral: Negative for confusion and sleep disturbance.    Objective:  BP 126/78 (BP Location: Left Arm, Patient Position: Sitting, Cuff Size: Normal)   Pulse 69   Temp 98.1 F (36.7 C) (Oral)   Ht 5\' 2"  (1.575 m)   Wt 161 lb (73 kg)   SpO2 98%    BMI 29.45 kg/m   BP Readings from Last 3 Encounters:  11/13/17 126/78  07/20/17 136/78  06/15/17 (!) 148/90    Wt Readings from Last 3 Encounters:  11/13/17 161 lb (73 kg)  07/20/17 155 lb (70.3 kg)  06/15/17 155 lb (70.3 kg)    Physical Exam  Constitutional: She appears well-developed. No distress.  HENT:  Head: Normocephalic.  Right Ear: External ear normal.  Left Ear: External ear normal.  Nose: Nose normal.  Mouth/Throat: Oropharynx is clear and moist.  Eyes: Pupils are equal, round, and reactive to light. Conjunctivae are normal. Right eye exhibits no discharge. Left eye exhibits no discharge.  Neck: Normal range of motion. Neck supple. No JVD present. No tracheal deviation present. No thyromegaly present.  Cardiovascular: Normal rate, regular rhythm and normal heart sounds.  Pulmonary/Chest: No stridor. No respiratory distress. She has no wheezes.  Abdominal: Soft. Bowel sounds are normal. She exhibits no distension and no mass. There is no tenderness. There is no rebound and no guarding.  Musculoskeletal: She exhibits no edema or tenderness.  Lymphadenopathy:    She has no cervical adenopathy.  Neurological: She displays normal reflexes. No cranial nerve deficit. She exhibits normal muscle tone. Coordination normal.  Skin: Rash noted. No erythema.  Psychiatric: She has a normal mood and affect. Her behavior is normal. Judgment and thought content normal.    Rash on forearms  Lab Results  Component Value Date   WBC 8.0  12/12/2016   HGB 14.5 12/12/2016   HCT 41.3 12/12/2016   PLT 310.0 12/12/2016   GLUCOSE 104 (H) 01/25/2017   CHOL 265 (H) 01/25/2017   TRIG 187.0 (H) 01/25/2017   HDL 53.20 01/25/2017   LDLDIRECT 216.8 04/24/2013   LDLCALC 175 (H) 01/25/2017   ALT 17 12/12/2016   AST 16 12/12/2016   NA 139 01/25/2017   K 4.2 01/25/2017   CL 105 01/25/2017   CREATININE 1.00 01/25/2017   BUN 20 01/25/2017   CO2 26 01/25/2017   TSH 1.37 12/12/2016   HGBA1C  5.6 12/12/2016    Mm Digital Screening Bilateral  Result Date: 12/01/2015 CLINICAL DATA:  Screening. EXAM: DIGITAL SCREENING BILATERAL MAMMOGRAM WITH CAD COMPARISON:  Previous exam(s). ACR Breast Density Category b: There are scattered areas of fibroglandular density. FINDINGS: There are no findings suspicious for malignancy. Images were processed with CAD. IMPRESSION: No mammographic evidence of malignancy. A result letter of this screening mammogram will be mailed directly to the patient. RECOMMENDATION: Screening mammogram in one year. (Code:SM-B-01Y) BI-RADS CATEGORY  1: Negative. Electronically Signed   By: Dorise Bullion III M.D   On: 12/01/2015 17:11    Assessment & Plan:   There are no diagnoses linked to this encounter. I am having Katalaya A. Lablanc maintain her fluocinolone, Vitamin B-12, Vitamin D3, losartan, verapamil, famotidine, and bumetanide.  No orders of the defined types were placed in this encounter.    Follow-up: No follow-ups on file.  Walker Kehr, MD

## 2017-11-25 ENCOUNTER — Other Ambulatory Visit: Payer: Self-pay | Admitting: Internal Medicine

## 2018-01-14 ENCOUNTER — Other Ambulatory Visit: Payer: Self-pay | Admitting: Internal Medicine

## 2018-02-15 ENCOUNTER — Other Ambulatory Visit (INDEPENDENT_AMBULATORY_CARE_PROVIDER_SITE_OTHER): Payer: Medicare Other

## 2018-02-15 ENCOUNTER — Encounter: Payer: Self-pay | Admitting: Internal Medicine

## 2018-02-15 ENCOUNTER — Ambulatory Visit (INDEPENDENT_AMBULATORY_CARE_PROVIDER_SITE_OTHER): Payer: Medicare Other | Admitting: Internal Medicine

## 2018-02-15 VITALS — BP 128/84 | HR 87 | Temp 98.7°F | Ht 62.0 in | Wt 155.0 lb

## 2018-02-15 DIAGNOSIS — E669 Obesity, unspecified: Secondary | ICD-10-CM

## 2018-02-15 DIAGNOSIS — E785 Hyperlipidemia, unspecified: Secondary | ICD-10-CM | POA: Diagnosis not present

## 2018-02-15 DIAGNOSIS — R739 Hyperglycemia, unspecified: Secondary | ICD-10-CM

## 2018-02-15 DIAGNOSIS — E538 Deficiency of other specified B group vitamins: Secondary | ICD-10-CM

## 2018-02-15 DIAGNOSIS — I10 Essential (primary) hypertension: Secondary | ICD-10-CM | POA: Diagnosis not present

## 2018-02-15 LAB — LIPID PANEL
Cholesterol: 313 mg/dL — ABNORMAL HIGH (ref 0–200)
HDL: 57.8 mg/dL (ref >39.00)
NonHDL: 255.18
Total CHOL/HDL Ratio: 5
Triglycerides: 324 mg/dL — ABNORMAL HIGH (ref 0.0–149.0)
VLDL: 64.8 mg/dL — ABNORMAL HIGH (ref 0.0–40.0)

## 2018-02-15 LAB — HEPATIC FUNCTION PANEL
ALT: 17 U/L (ref 0–35)
AST: 14 U/L (ref 0–37)
Albumin: 4.9 g/dL (ref 3.5–5.2)
Alkaline Phosphatase: 102 U/L (ref 39–117)
Bilirubin, Direct: 0.1 mg/dL (ref 0.0–0.3)
Total Bilirubin: 0.5 mg/dL (ref 0.2–1.2)
Total Protein: 8.4 g/dL — ABNORMAL HIGH (ref 6.0–8.3)

## 2018-02-15 LAB — URINALYSIS
Bilirubin Urine: NEGATIVE
Ketones, ur: NEGATIVE
Leukocytes, UA: NEGATIVE
Nitrite: NEGATIVE
Specific Gravity, Urine: <=1.005 — AB (ref 1.000–1.030)
Total Protein, Urine: NEGATIVE
Urine Glucose: NEGATIVE
Urobilinogen, UA: 0.2 (ref 0.0–1.0)
pH: 6.5 (ref 5.0–8.0)

## 2018-02-15 LAB — BASIC METABOLIC PANEL
BUN: 19 mg/dL (ref 6–23)
CO2: 25 mEq/L (ref 19–32)
Calcium: 10.6 mg/dL — ABNORMAL HIGH (ref 8.4–10.5)
Chloride: 103 mEq/L (ref 96–112)
Creatinine, Ser: 0.9 mg/dL (ref 0.40–1.20)
GFR: 67.66 mL/min (ref >60.00)
Glucose, Bld: 106 mg/dL — ABNORMAL HIGH (ref 70–99)
Potassium: 4.1 mEq/L (ref 3.5–5.1)
Sodium: 140 mEq/L (ref 135–145)

## 2018-02-15 LAB — CBC WITH DIFFERENTIAL/PLATELET
Basophils Absolute: 0.1 10*3/uL (ref 0.0–0.1)
Basophils Relative: 1 % (ref 0.0–3.0)
Eosinophils Absolute: 0.6 10*3/uL (ref 0.0–0.7)
Eosinophils Relative: 6.9 % — ABNORMAL HIGH (ref 0.0–5.0)
HCT: 44.5 % (ref 36.0–46.0)
Hemoglobin: 15.4 g/dL — ABNORMAL HIGH (ref 12.0–15.0)
Lymphocytes Relative: 25 % (ref 12.0–46.0)
Lymphs Abs: 2.3 10*3/uL (ref 0.7–4.0)
MCHC: 34.7 g/dL (ref 30.0–36.0)
MCV: 91.8 fl (ref 78.0–100.0)
Monocytes Absolute: 0.5 10*3/uL (ref 0.1–1.0)
Monocytes Relative: 5.5 % (ref 3.0–12.0)
Neutro Abs: 5.7 10*3/uL (ref 1.4–7.7)
Neutrophils Relative %: 61.6 % (ref 43.0–77.0)
Platelets: 354 10*3/uL (ref 150.0–400.0)
RBC: 4.84 Mil/uL (ref 3.87–5.11)
RDW: 13.7 % (ref 11.5–15.5)
WBC: 9.2 10*3/uL (ref 4.0–10.5)

## 2018-02-15 LAB — LDL CHOLESTEROL, DIRECT: Direct LDL: 231 mg/dL

## 2018-02-15 LAB — TSH: TSH: 1.25 u[IU]/mL (ref 0.35–4.50)

## 2018-02-15 MED ORDER — IBUPROFEN 800 MG PO TABS: 800.0000 mg | ORAL_TABLET | Freq: Two times a day (BID) | ORAL | 2 refills | Status: DC | PRN

## 2018-02-15 NOTE — Assessment & Plan Note (Signed)
On B12 

## 2018-02-15 NOTE — Assessment & Plan Note (Signed)
Labs

## 2018-02-15 NOTE — Assessment & Plan Note (Signed)
Lost wt °

## 2018-02-15 NOTE — Assessment & Plan Note (Signed)
Better - Losartan NAS diet Wt loss

## 2018-02-15 NOTE — Progress Notes (Signed)
Subjective:  Patient ID: Danielle [name], female    DOB: 27-May-1956  Age: 62 y.o. MRN: [id number]  CC: No chief complaint on file.   HPI [name] presents for HTN, OA, obesity f/u  Outpatient Medications Prior to Visit  Medication Sig Dispense Refill  . bumetanide (BUMEX) 0.5 MG tablet Take 1 tablet (0.5 mg total) by mouth daily. 30 tablet 11  . Cholecalciferol (VITAMIN D3) 2000 units capsule Take 1 capsule (2,000 Units total) by mouth daily. 100 capsule 3  . Cyanocobalamin (VITAMIN B-12) 1000 MCG SUBL Place 1 tablet (1,000 mcg total) under the tongue daily. 100 tablet 3  . famotidine (PEPCID) 40 MG tablet     . famotidine (PEPCID) 40 MG tablet TAKE 1 TABLET BY MOUTH ONCE DAILY AT BEDTIME 90 tablet 3  . fluocinolone (VANOS) 0.01 % cream Use once daily on scalp rash     . losartan (COZAAR) 100 MG tablet TAKE 1 TABLET BY MOUTH ONCE DAILY 90 tablet 1  . mometasone (ELOCON) 0.1 % lotion APPLY   TOPICALLY TO AFFECTED AREA TWICE DAILY 60 mL 1   No facility-administered medications prior to visit.     ROS: Review of Systems  Constitutional: Negative for activity change, appetite change, chills, fatigue and unexpected weight change.  HENT: Negative for congestion, mouth sores and sinus pressure.   Eyes: Negative for visual disturbance.  Respiratory: Negative for cough and chest tightness.   Gastrointestinal: Negative for abdominal pain and nausea.  Genitourinary: Negative for difficulty urinating, frequency and vaginal pain.  Musculoskeletal: Positive for arthralgias and back pain. Negative for gait problem.  Skin: Negative for pallor and rash.  Neurological: Negative for dizziness, tremors, weakness, numbness and headaches.  Psychiatric/Behavioral: Negative for confusion, sleep disturbance and suicidal ideas.    Objective:  BP 128/84 (BP Location: Left Arm, Patient Position: Sitting, Cuff Size: Normal)   Pulse 87   Temp 98.7 F (37.1 C) (Oral)   Ht 5\' 2"  (1.575 m)   Wt 155 lb  (70.3 kg)   SpO2 95%   BMI 28.35 kg/m   BP Readings from Last 3 Encounters:  02/15/18 128/84  11/13/17 126/78  07/20/17 136/78    Wt Readings from Last 3 Encounters:  02/15/18 155 lb (70.3 kg)  11/13/17 161 lb (73 kg)  07/20/17 155 lb (70.3 kg)    Physical Exam  Constitutional: She appears well-developed. No distress.  HENT:  Head: Normocephalic.  Right Ear: External ear normal.  Left Ear: External ear normal.  Nose: Nose normal.  Mouth/Throat: Oropharynx is clear and moist.  Eyes: Pupils are equal, round, and reactive to light. Conjunctivae are normal. Right eye exhibits no discharge. Left eye exhibits no discharge.  Neck: Normal range of motion. Neck supple. No JVD present. No tracheal deviation present. No thyromegaly present.  Cardiovascular: Normal rate, regular rhythm and normal heart sounds.  Pulmonary/Chest: No stridor. No respiratory distress. She has no wheezes.  Abdominal: Soft. Bowel sounds are normal. She exhibits no distension and no mass. There is no tenderness. There is no rebound and no guarding.  Musculoskeletal: She exhibits no edema or tenderness.  Lymphadenopathy:    She has no cervical adenopathy.  Neurological: She displays normal reflexes. No cranial nerve deficit. She exhibits normal muscle tone. Coordination normal.  Skin: No rash noted. No erythema.  Psychiatric: She has a normal mood and affect. Her behavior is normal. Judgment and thought content normal.    Lab Results  Component Value Date   WBC  8.0 12/12/2016   HGB 14.5 12/12/2016   HCT 41.3 12/12/2016   PLT 310.0 12/12/2016   GLUCOSE 104 (H) 01/25/2017   CHOL 265 (H) 01/25/2017   TRIG 187.0 (H) 01/25/2017   HDL 53.20 01/25/2017   LDLDIRECT 216.8 04/24/2013   LDLCALC 175 (H) 01/25/2017   ALT 17 12/12/2016   AST 16 12/12/2016   NA 139 01/25/2017   K 4.2 01/25/2017   CL 105 01/25/2017   CREATININE 1.00 01/25/2017   BUN 20 01/25/2017   CO2 26 01/25/2017   TSH 1.37 12/12/2016    HGBA1C 5.6 12/12/2016    Mm Digital Screening Bilateral  Result Date: 12/01/2015 CLINICAL DATA:  Screening. EXAM: DIGITAL SCREENING BILATERAL MAMMOGRAM WITH CAD COMPARISON:  Previous exam(s). ACR Breast Density Category b: There are scattered areas of fibroglandular density. FINDINGS: There are no findings suspicious for malignancy. Images were processed with CAD. IMPRESSION: No mammographic evidence of malignancy. A result letter of this screening mammogram will be mailed directly to the patient. RECOMMENDATION: Screening mammogram in one year. (Code:SM-B-01Y) BI-RADS CATEGORY  1: Negative. Electronically Signed   By: Dorise Bullion III M.D   On: 12/01/2015 17:11    Assessment & Plan:   There are no diagnoses linked to this encounter.   No orders of the defined types were placed in this encounter.    Follow-up: No follow-ups on file.  Walker Kehr, MD

## 2018-02-19 ENCOUNTER — Telehealth: Payer: Self-pay | Admitting: *Deleted

## 2018-02-19 DIAGNOSIS — G4733 Obstructive sleep apnea (adult) (pediatric): Secondary | ICD-10-CM

## 2018-02-19 NOTE — Telephone Encounter (Signed)
Pt informed of below. She states her appointment with Dr. Janee Morn office was cancelled and never rescheduled. Needing new referral.

## 2018-02-19 NOTE — Telephone Encounter (Signed)
-----   Message from Cassandria Anger, MD sent at 02/18/2018 11:06 AM EDT ----- Danielle [name], Please inform the patient that all labs are stable, except for elevated lipids that are worse.  Elevated hemoglobin that may indicate the worsening of sleep apnea.  She needs to follow-up with Dr. Annamaria Boots.  Which is very consider taking a cholesterol-lowering medication? Thanks, AP

## 2018-02-19 NOTE — Telephone Encounter (Signed)
Ok thx.

## 2018-03-04 ENCOUNTER — Other Ambulatory Visit: Payer: Self-pay | Admitting: Internal Medicine

## 2018-03-04 DIAGNOSIS — R0683 Snoring: Secondary | ICD-10-CM

## 2018-03-04 MED ORDER — ROSUVASTATIN CALCIUM 5 MG PO TABS: 5.0000 mg | ORAL_TABLET | Freq: Every day | ORAL | 11 refills | Status: DC

## 2018-05-22 ENCOUNTER — Other Ambulatory Visit: Payer: Self-pay | Admitting: Internal Medicine

## 2018-05-31 ENCOUNTER — Ambulatory Visit (INDEPENDENT_AMBULATORY_CARE_PROVIDER_SITE_OTHER): Payer: Medicare Other | Admitting: Internal Medicine

## 2018-05-31 ENCOUNTER — Encounter: Payer: Self-pay | Admitting: Internal Medicine

## 2018-05-31 VITALS — BP 130/78 | HR 84 | Ht 62.0 in | Wt 144.6 lb

## 2018-05-31 DIAGNOSIS — G4733 Obstructive sleep apnea (adult) (pediatric): Secondary | ICD-10-CM

## 2018-05-31 NOTE — Progress Notes (Signed)
05/31/2018-62 year old female former smoker for sleep evaluation. HST  06/10/2013-AHI 13.2/hour, desaturation to 66%, body weight 156 pounds --------had a HST years ago and was diagnosed with mild sleep apnea Medical problem list includes allergic rhinitis, HBP, GERD, insomnia, OSA Epworth Score 5 Husband tells her of witnessed apneas and loud snoring.  She is concerned about 3 episodes when she startled to wake and immediately upright and standing.  No sleepwalking.  Denies daytime sleepiness. Not aware of heart or lung disease.  ENT surgery limited to tonsils and adenoids.  Prior to Admission medications   Medication Sig Start Date End Date Taking? Authorizing Provider  bumetanide (BUMEX) 0.5 MG tablet Take 1 tablet (0.5 mg total) by mouth daily. 06/15/17  Yes Plotnikov, Evie Lacks, MD  Cholecalciferol (VITAMIN D3) 2000 units capsule Take 1 capsule (2,000 Units total) by mouth daily. 11/30/16  Yes Plotnikov, Evie Lacks, MD  Cyanocobalamin (VITAMIN B-12) 1000 MCG SUBL Place 1 tablet (1,000 mcg total) under the tongue daily. 12/03/13  Yes Plotnikov, Evie Lacks, MD  famotidine (PEPCID) 40 MG tablet TAKE 1 TABLET BY MOUTH ONCE DAILY AT BEDTIME 01/15/18  Yes Plotnikov, Evie Lacks, MD  fluocinolone (VANOS) 0.01 % cream Use once daily on scalp rash    Yes [provider]  ibuprofen (ADVIL,MOTRIN) 800 MG tablet Take 1 tablet (800 mg total) by mouth 2 (two) times daily as needed for headache or moderate pain. 02/15/18  Yes Plotnikov, Evie Lacks, MD  losartan (COZAAR) 100 MG tablet TAKE 1 TABLET BY MOUTH DAILY 05/23/18  Yes Plotnikov, Evie Lacks, MD  mometasone (ELOCON) 0.1 % lotion APPLY   TOPICALLY TO AFFECTED AREA TWICE DAILY 11/13/17  Yes Plotnikov, Evie Lacks, MD  rosuvastatin (CRESTOR) 5 MG tablet Take 1 tablet (5 mg total) by mouth daily. 03/04/18  Yes Plotnikov, Evie Lacks, MD  potassium chloride (K-DUR) 10 MEQ tablet TAKE ONE TABLET BY MOUTH TWICE DAILY 10/20/11 11/25/12  Plotnikov, Evie Lacks, MD   Past  Medical History:  Diagnosis Date  . Acute sinusitis, unspecified 10/24/2008  . ALLERGIC RHINITIS 10/24/2008  . BRONCHITIS, ACUTE 06/13/2008  . Cough 06/13/2008  . DYSPNEA 05/07/2010  . FOOT PAIN 04/02/2010  . GERD 02/19/2007  . 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 HYPERTENSION 02/15/2008  . INSOMNIA, PERSISTENT 02/15/2008  . INTRAABDOMINAL ABSCESS 02/19/2007  . MOOD DISORDER 02/20/2009  . OBESITY 02/20/2009  . TOBACCO USE, QUIT 09/10/2009   Past Surgical History:  Procedure Laterality Date  . ABDOMINAL HYSTERECTOMY    . CHOLECYSTECTOMY    . ERCP     Family History  Problem Relation Age of Onset  . Hypertension Mother   . Hypertension Father   . Hypertension Other   ] Social History   Socioeconomic History  . Marital status: Married    Spouse name: Not on file  . Number of children: Not on file  . Years of education: Not on file  . Highest education level: Not on file  Occupational History  . Occupation: disabled for incis. hernias  Social Needs  . Financial resource strain: Not on file  . Food insecurity:    Worry: Not on file    Inability: Not on file  . Transportation needs:    Medical: Not on file    Non-medical: Not on file  Tobacco Use  . Smoking status: Former Research scientist (life sciences)  . Smokeless tobacco: Never Used  Substance and Sexual Activity  . Alcohol use: No  . Drug use: No  . Sexual activity: Yes  Lifestyle  . Physical activity:    Days per week: Not on file    Minutes per session: Not on file  . Stress: Not on file  Relationships  . Social connections:    Talks on phone: Not on file    Gets together: Not on file    Attends religious service: Not on file    Active member of club or organization: Not on file    Attends meetings of clubs or organizations: Not on file    Relationship status: Not on file  . Intimate partner violence:    Fear of current or ex partner: Not on file    Emotionally abused: Not on file    Physically abused: Not on file     Forced sexual activity: Not on file  Other Topics Concern  . Not on file  Social History Narrative  . Not on file   ROS-see HPI   + = positive Constitutional:    weight loss, night sweats, fevers, chills, fatigue, lassitude. HEENT:    headaches, difficulty swallowing, tooth/dental problems, sore throat,       sneezing, itching, ear ache, nasal congestion, post nasal drip, snoring CV:    chest pain, orthopnea, PND, +swelling in lower extremities, anasarca,                                  dizziness, palpitations Resp:   +shortness of breath with exertion or at rest.                productive cough,   non-productive cough, coughing up of blood.              change in color of mucus.  wheezing.   Skin:    rash or lesions. GI:  No-   heartburn, indigestion, abdominal pain, nausea, vomiting, diarrhea,                 change in bowel habits, loss of appetite GU: dysuria, change in color of urine, no urgency or frequency.   flank pain. MS:   +joint pain, stiffness, decreased range of motion, back pain. Neuro-     nothing unusual Psych:  change in mood or affect.  depression or anxiety.   memory loss.  OBJ- Physical Exam General- Alert, Oriented, Affect-appropriate, Distress- none acute Skin- rash-none, lesions- none, excoriation- none Lymphadenopathy- none Head- atraumatic            Eyes- Gross vision intact, PERRLA, conjunctivae and secretions clear            Ears- Hearing, canals-normal            Nose- Clear, no-Septal dev, mucus, polyps, erosion, perforation             Throat- Mallampati IV , mucosa clear , drainage- none, tonsils- atrophic Neck- flexible , trachea midline, no stridor , thyroid nl, carotid no bruit Chest - symmetrical excursion , unlabored           Heart/CV- RRR , no murmur , no gallop  , no rub, nl s1 s2                           - JVD- none , edema- none, stasis changes- none, varices- none           Lung- clear to P&A, wheeze- none, cough- none , dullness-none,  rub- none  Chest wall-  Abd-  Br/ Gen/ Rectal- Not done, not indicated Extrem- cyanosis- none, clubbing, none, atrophy- none, strength- nl Neuro- grossly intact to observation

## 2018-05-31 NOTE — Assessment & Plan Note (Signed)
He is not significantly overweight at this visit but understands that maintaining normal weight definitely helps management of sleep apnea.

## 2018-05-31 NOTE — Patient Instructions (Signed)
Order- please schedule unattended home sleep test   Dx OSA  Please call me about 2 weeks after your sleep test, for results and recommendations. If appropriate, we may be able to start treatment before we see you again.

## 2018-05-31 NOTE — Assessment & Plan Note (Signed)
I reviewed the basics of sleep apnea, good sleep hygiene, available treatments, responsibility to drive alertly. Plan-schedule sleep study to update documentation.  If appropriate, anticipate either oral appliance or CPAP.

## 2018-07-08 ENCOUNTER — Other Ambulatory Visit: Payer: Self-pay | Admitting: Internal Medicine

## 2018-07-26 DIAGNOSIS — G4733 Obstructive sleep apnea (adult) (pediatric): Secondary | ICD-10-CM | POA: Diagnosis not present

## 2018-07-30 ENCOUNTER — Other Ambulatory Visit: Payer: Self-pay | Admitting: *Deleted

## 2018-07-30 DIAGNOSIS — G4733 Obstructive sleep apnea (adult) (pediatric): Secondary | ICD-10-CM

## 2018-08-07 DIAGNOSIS — G4733 Obstructive sleep apnea (adult) (pediatric): Secondary | ICD-10-CM | POA: Diagnosis not present

## 2018-08-16 ENCOUNTER — Telehealth: Payer: Self-pay | Admitting: Internal Medicine

## 2018-08-16 NOTE — Telephone Encounter (Signed)
Called and spoke with Patient.  She stated that she had a home sleep study done on 07/26/18, and requested results.  Dr Annamaria Boots, please advise on sleep study results

## 2018-08-17 NOTE — Telephone Encounter (Signed)
Her home sleep test did show mild obstructive sleep apnea, averaging 10.5 apneas/ hour, with drops in blood oxygen level. I would treat this with either CPAP or a fitted oral appliance.  She has an appointment for follow-up on Feb 24, so if she would like to wait until that visit so I can go over this in more detail, that would be efine.

## 2018-08-17 NOTE — Telephone Encounter (Signed)
Advised pt of results. Pt understood and nothing further is needed.   Pt will discuss in detail at the [facility].

## 2018-08-21 ENCOUNTER — Ambulatory Visit: Payer: Medicare Other | Admitting: Internal Medicine

## 2018-08-21 DIAGNOSIS — Z0289 Encounter for other administrative examinations: Secondary | ICD-10-CM

## 2018-08-28 ENCOUNTER — Telehealth: Payer: Self-pay | Admitting: Emergency Medicine

## 2018-08-28 NOTE — Telephone Encounter (Signed)
LVM informing pt we have an 11:20. Pt has been scheduled. Advised pt to call back if time does not work for her. Will follow up in the morning to verify that pt will be coming.    Copied from [place] [id number]. Topic: General - Other >> Aug 28, 2018  1:05 PM Windy Kalata wrote: Reason for CRM: Patient believes she has a bladder infection and will be in [facility] tomorrow and would like to do a urine sample while in town, please call to advise  Best call back is [phone] >> Aug 28, 2018  1:34 PM Karren Cobble, CMA wrote: Pt will need to be seen as she is due for a OV anyway's. Held the 1120 slot for patient if she is able to come then. If not she can see someone else.

## 2018-08-29 ENCOUNTER — Encounter: Payer: Self-pay | Admitting: Internal Medicine

## 2018-08-29 ENCOUNTER — Telehealth: Payer: Self-pay | Admitting: Internal Medicine

## 2018-08-29 ENCOUNTER — Ambulatory Visit (INDEPENDENT_AMBULATORY_CARE_PROVIDER_SITE_OTHER): Payer: Medicare Other | Admitting: Internal Medicine

## 2018-08-29 ENCOUNTER — Other Ambulatory Visit (INDEPENDENT_AMBULATORY_CARE_PROVIDER_SITE_OTHER): Payer: Medicare Other

## 2018-08-29 VITALS — BP 152/88 | HR 69 | Temp 97.6°F | Ht 62.0 in | Wt 157.0 lb

## 2018-08-29 DIAGNOSIS — E538 Deficiency of other specified B group vitamins: Secondary | ICD-10-CM

## 2018-08-29 DIAGNOSIS — G4700 Insomnia, unspecified: Secondary | ICD-10-CM | POA: Diagnosis not present

## 2018-08-29 DIAGNOSIS — R300 Dysuria: Secondary | ICD-10-CM

## 2018-08-29 DIAGNOSIS — I10 Essential (primary) hypertension: Secondary | ICD-10-CM | POA: Diagnosis not present

## 2018-08-29 DIAGNOSIS — Z23 Encounter for immunization: Secondary | ICD-10-CM

## 2018-08-29 DIAGNOSIS — R7989 Other specified abnormal findings of blood chemistry: Secondary | ICD-10-CM

## 2018-08-29 LAB — BASIC METABOLIC PANEL
BUN: 22 mg/dL (ref 6–23)
CO2: 25 mEq/L (ref 19–32)
Calcium: 9.9 mg/dL (ref 8.4–10.5)
Chloride: 104 mEq/L (ref 96–112)
Creatinine, Ser: 0.86 mg/dL (ref 0.40–1.20)
GFR: 66.97 mL/min (ref >60.00)
Glucose, Bld: 98 mg/dL (ref 70–99)
Potassium: 4 mEq/L (ref 3.5–5.1)
Sodium: 141 mEq/L (ref 135–145)

## 2018-08-29 LAB — URINALYSIS, ROUTINE W REFLEX MICROSCOPIC
Bilirubin Urine: NEGATIVE
Hgb urine dipstick: NEGATIVE
Ketones, ur: NEGATIVE
Nitrite: POSITIVE — AB
Specific Gravity, Urine: 1.015 (ref 1.000–1.030)
Total Protein, Urine: NEGATIVE
Urine Glucose: NEGATIVE
Urobilinogen, UA: 0.2 (ref 0.0–1.0)
pH: 7 (ref 5.0–8.0)

## 2018-08-29 LAB — VITAMIN B12: Vitamin B-12: 763 pg/mL (ref 211–911)

## 2018-08-29 MED ORDER — CEFUROXIME AXETIL 250 MG PO TABS: 250.0000 mg | ORAL_TABLET | Freq: Two times a day (BID) | ORAL | 0 refills | Status: AC

## 2018-08-29 NOTE — Assessment & Plan Note (Signed)
BP Readings from Last 3 Encounters:  08/29/18 (!) 152/88  05/31/18 130/78  02/15/18 128/84

## 2018-08-29 NOTE — Patient Instructions (Signed)
Weighted blanket 

## 2018-08-29 NOTE — Telephone Encounter (Signed)
Copied from [place] [id number]. Topic: Quick Communication - Rx Refill/Question >> Aug 29, 2018 12:06 PM Windy Kalata wrote: Medication: cefUROXime (CEFTIN) 250 MG tablet  Has the patient contacted their pharmacy? Yes.   (Agent: If no, request that the patient contact the pharmacy for the refill.) (Agent: If yes, when and what did the pharmacy advise?) Please call pharmacy as the dosage does not make sense  Preferred Pharmacy (with phone number or street name): [place], [place] - [address] [phone] (Phone) [phone] (Fax)    Agent: Please be advised that RX refills may take up to 3 business days. We ask that you follow-up with your pharmacy.

## 2018-08-29 NOTE — Telephone Encounter (Signed)
Please review Rx directions and number- will need correction.

## 2018-08-29 NOTE — Progress Notes (Signed)
Subjective:  Patient ID: Danielle [name], female    DOB: [birth year]  Age: 63 y.o. MRN: [id number]  CC: No chief complaint on file.   HPI Diara A Deoliveira presents for urinary sx's x3 weeks F/u HTN - BP ok at home. C/o obesity  Outpatient Medications Prior to Visit  Medication Sig Dispense Refill  . bumetanide (BUMEX) 0.5 MG tablet TAKE 1 TABLET BY MOUTH ONCE DAILY 30 tablet 2  . Cholecalciferol (VITAMIN D3) 2000 units capsule Take 1 capsule (2,000 Units total) by mouth daily. 100 capsule 3  . Cyanocobalamin (VITAMIN B-12) 1000 MCG SUBL Place 1 tablet (1,000 mcg total) under the tongue daily. 100 tablet 3  . famotidine (PEPCID) 40 MG tablet TAKE 1 TABLET BY MOUTH ONCE DAILY AT BEDTIME 90 tablet 3  . fluocinolone (VANOS) 0.01 % cream Use once daily on scalp rash     . ibuprofen (ADVIL,MOTRIN) 800 MG tablet Take 1 tablet (800 mg total) by mouth 2 (two) times daily as needed for headache or moderate pain. 60 tablet 2  . losartan (COZAAR) 100 MG tablet TAKE 1 TABLET BY MOUTH DAILY 90 tablet 1  . mometasone (ELOCON) 0.1 % lotion APPLY   TOPICALLY TO AFFECTED AREA TWICE DAILY 60 mL 1  . rosuvastatin (CRESTOR) 5 MG tablet Take 1 tablet (5 mg total) by mouth daily. 90 tablet 11   No facility-administered medications prior to visit.     ROS: Review of Systems  Constitutional: Negative for activity change, appetite change, chills, fatigue and unexpected weight change.  HENT: Negative for congestion, mouth sores and sinus pressure.   Eyes: Negative for visual disturbance.  Respiratory: Negative for cough and chest tightness.   Gastrointestinal: Negative for abdominal pain and nausea.  Genitourinary: Negative for difficulty urinating, frequency and vaginal pain.  Musculoskeletal: Negative for back pain and gait problem.  Skin: Negative for pallor and rash.  Neurological: Negative for dizziness, tremors, weakness, numbness and headaches.  Psychiatric/Behavioral: Positive for sleep disturbance.  Negative for confusion.    Objective:  BP (!) 152/88 (BP Location: Other (Comment), Patient Position: Sitting, Cuff Size: Normal)   Pulse 69   Temp 97.6 F (36.4 C) (Oral)   Ht 5\' 2"  (1.575 m)   Wt 157 lb (71.2 kg)   SpO2 95%   BMI 28.72 kg/m   BP Readings from Last 3 Encounters:  08/29/18 (!) 152/88  05/31/18 130/78  02/15/18 128/84    Wt Readings from Last 3 Encounters:  08/29/18 157 lb (71.2 kg)  05/31/18 144 lb 9.6 oz (65.6 kg)  02/15/18 155 lb (70.3 kg)    Physical Exam Constitutional:      General: She is not in acute distress.    Appearance: She is well-developed.  HENT:     Head: Normocephalic.     Right Ear: External ear normal.     Left Ear: External ear normal.     Nose: Nose normal.  Eyes:     General:        Right eye: No discharge.        Left eye: No discharge.     Conjunctiva/sclera: Conjunctivae normal.     Pupils: Pupils are equal, round, and reactive to light.  Neck:     Musculoskeletal: Normal range of motion and neck supple.     Thyroid: No thyromegaly.     Vascular: No JVD.     Trachea: No tracheal deviation.  Cardiovascular:     Rate and Rhythm: Normal rate and  regular rhythm.     Heart sounds: Normal heart sounds.  Pulmonary:     Effort: No respiratory distress.     Breath sounds: No stridor. No wheezing.  Abdominal:     General: Bowel sounds are normal. There is no distension.     Palpations: Abdomen is soft. There is no mass.     Tenderness: There is no abdominal tenderness. There is no guarding or rebound.  Musculoskeletal:        General: No tenderness.  Lymphadenopathy:     Cervical: No cervical adenopathy.  Skin:    Findings: No erythema or rash.  Neurological:     Cranial Nerves: No cranial nerve deficit.     Motor: No abnormal muscle tone.     Coordination: Coordination normal.     Deep Tendon Reflexes: Reflexes normal.  Psychiatric:        Behavior: Behavior normal.        Thought Content: Thought content normal.         Judgment: Judgment normal.   obese  Lab Results  Component Value Date   WBC 9.2 02/15/2018   HGB 15.4 (H) 02/15/2018   HCT 44.5 02/15/2018   PLT 354.0 02/15/2018   GLUCOSE 106 (H) 02/15/2018   CHOL 313 (H) 02/15/2018   TRIG 324.0 (H) 02/15/2018   HDL 57.80 02/15/2018   LDLDIRECT 231.0 02/15/2018   LDLCALC 175 (H) 01/25/2017   ALT 17 02/15/2018   AST 14 02/15/2018   NA 140 02/15/2018   K 4.1 02/15/2018   CL 103 02/15/2018   CREATININE 0.90 02/15/2018   BUN 19 02/15/2018   CO2 25 02/15/2018   TSH 1.25 02/15/2018   HGBA1C 5.6 12/12/2016    Mm Digital Screening Bilateral  Result Date: 12/01/2015 CLINICAL DATA:  Screening. EXAM: DIGITAL SCREENING BILATERAL MAMMOGRAM WITH CAD COMPARISON:  Previous exam(s). ACR Breast Density Category b: There are scattered areas of fibroglandular density. FINDINGS: There are no findings suspicious for malignancy. Images were processed with CAD. IMPRESSION: No mammographic evidence of malignancy. A result letter of this screening mammogram will be mailed directly to the patient. RECOMMENDATION: Screening mammogram in one year. (Code:SM-B-01Y) BI-RADS CATEGORY  1: Negative. Electronically Signed   By: Dorise Bullion III M.D   On: 12/01/2015 17:11    Assessment & Plan:   There are no diagnoses linked to this encounter.   No orders of the defined types were placed in this encounter.    Follow-up: No follow-ups on file.  Walker Kehr, MD

## 2018-08-29 NOTE — Assessment & Plan Note (Signed)
UA, Cx Cipro

## 2018-08-29 NOTE — Telephone Encounter (Signed)
Spoke with Danielle [name] and verified sig from Dr Camila Li. Contacted [facility] to inform.

## 2018-08-29 NOTE — Assessment & Plan Note (Signed)
Weighted blanket 

## 2018-08-29 NOTE — Addendum Note (Signed)
Addended by: Karren Cobble on: 08/29/2018 11:59 AM   Modules accepted: Orders

## 2018-08-29 NOTE — Assessment & Plan Note (Signed)
On Vit B12 

## 2018-08-29 NOTE — Telephone Encounter (Signed)
Tried contacting pt to verify that she is able to come in today, no answer.

## 2018-09-02 LAB — CULTURE, URINE COMPREHENSIVE
MICRO NUMBER:: 214999
SPECIMEN QUALITY:: ADEQUATE

## 2018-09-03 ENCOUNTER — Ambulatory Visit (INDEPENDENT_AMBULATORY_CARE_PROVIDER_SITE_OTHER): Payer: Medicare Other | Admitting: Internal Medicine

## 2018-09-03 ENCOUNTER — Encounter: Payer: Self-pay | Admitting: Internal Medicine

## 2018-09-03 VITALS — BP 124/72 | HR 81 | Ht 62.0 in | Wt 160.0 lb

## 2018-09-03 DIAGNOSIS — G4700 Insomnia, unspecified: Secondary | ICD-10-CM | POA: Diagnosis not present

## 2018-09-03 DIAGNOSIS — G4733 Obstructive sleep apnea (adult) (pediatric): Secondary | ICD-10-CM | POA: Diagnosis not present

## 2018-09-03 NOTE — Progress Notes (Signed)
HPI  female former smoker followed for OSA, complicated by allergic rhinitis, HBP, GERD, insomnia HST  06/10/2013-AHI 13.2/hour, desaturation to 66%, body weight 156 pounds HST 07/26/2018-AHI 10.5/hour, desaturation to 79%, body weight 144.9 pound -------------------------------------------------------------------------  05/31/2018-63 year old female former smoker for sleep evaluation. HST  06/10/2013-AHI 13.2/hour, desaturation to 66%, body weight 156 pounds --------had a HST years ago and was diagnosed with mild sleep apnea Medical problem list includes allergic rhinitis, HBP, GERD, insomnia, OSA Epworth Score 5 Husband tells her of witnessed apneas and loud snoring.  She is concerned about 3 episodes when she startled to wake and immediately upright and standing.  No sleepwalking.  Denies daytime sleepiness. Not aware of heart or lung disease.  ENT surgery limited to tonsils and adenoids.  09/03/2018-63 year old female former smoker followed for OSA, complicated by allergic rhinitis, HBP, GERD, insomnia HST 07/26/2018-AHI 10.5/hour, desaturation to 79%, body weight 144.9 pounds Here to go over home sleep test      We reviewed her sleep study showing mild OSA with desaturation and discussed her husband's descriptions of loud snoring and witnessed apnea as well as her physicians concerned about nocturnal desaturation.  I discussed management of mild OSA and treatment options.  She has chosen to try CPAP will be started at auto 5-15.  ROS-see HPI   + = positive Constitutional:    weight loss, night sweats, fevers, chills, fatigue, lassitude. HEENT:    headaches, difficulty swallowing, tooth/dental problems, sore throat,       sneezing, itching, ear ache, nasal congestion, post nasal drip, snoring CV:    chest pain, orthopnea, PND, +swelling in lower extremities, anasarca,                                  dizziness, palpitations Resp:   +shortness of breath with exertion or at rest.    productive cough,   non-productive cough, coughing up of blood.              change in color of mucus.  wheezing.   Skin:    rash or lesions. GI:  No-   heartburn, indigestion, abdominal pain, nausea, vomiting, diarrhea,                 change in bowel habits, loss of appetite GU: dysuria, change in color of urine, no urgency or frequency.   flank pain. MS:   +joint pain, stiffness, decreased range of motion, back pain. Neuro-     nothing unusual Psych:  change in mood or affect.  depression or anxiety.   memory loss.  OBJ- Physical Exam General- Alert, Oriented, Affect-appropriate, Distress- none acute Skin- rash-none, lesions- none, excoriation- none Lymphadenopathy- none Head- atraumatic            Eyes- Gross vision intact, PERRLA, conjunctivae and secretions clear            Ears- Hearing, canals-normal            Nose- Clear, no-Septal dev, mucus, polyps, erosion, perforation             Throat- Mallampati IV , mucosa clear , drainage- none, tonsils- atrophic Neck- flexible , trachea midline, no stridor , thyroid nl, carotid no bruit Chest - symmetrical excursion , unlabored           Heart/CV- RRR , no murmur , no gallop  , no rub, nl s1 s2                           -  JVD- none , edema- none, stasis changes- none, varices- none           Lung- clear to P&A, wheeze- none, cough- none , dullness-none, rub- none           Chest wall-  Abd-  Br/ Gen/ Rectal- Not done, not indicated Extrem- cyanosis- none, clubbing, none, atrophy- none, strength- nl Neuro- grossly intact to observation     

## 2018-09-03 NOTE — Patient Instructions (Signed)
Order- new DME, new CPAP auto 5-15, mask of choice, humidifier, supplies, AirView  Please call as needed

## 2018-09-04 ENCOUNTER — Telehealth: Payer: Self-pay

## 2018-09-04 DIAGNOSIS — R300 Dysuria: Secondary | ICD-10-CM

## 2018-09-04 NOTE — Telephone Encounter (Signed)
Copied from [place] [id number]. Topic: General - Other >> Sep 03, 2018  8:55 AM [name] wrote:  Pt was seen last week for a bladder infection and cal ltoday to say she still has it and is asking if the doctor would like to call her in something else   [place]

## 2018-09-04 NOTE — Assessment & Plan Note (Signed)
We considered options including conservative management of mild OSA.  She has decided to try CPAP. Plan-starting CPAP auto 5-15

## 2018-09-04 NOTE — Assessment & Plan Note (Signed)
Basic sleep hygiene review reinforced.

## 2018-09-05 NOTE — Telephone Encounter (Signed)
Pls repeat UA, Cx  Use AZO, Tylenol prn after submitted the test  Thx

## 2018-09-07 ENCOUNTER — Other Ambulatory Visit: Payer: Self-pay | Admitting: Internal Medicine

## 2018-09-07 MED ORDER — ROSUVASTATIN CALCIUM 5 MG PO TABS: 5.0000 mg | ORAL_TABLET | Freq: Every day | ORAL | 1 refills | Status: DC

## 2018-09-07 NOTE — Telephone Encounter (Signed)
Pt following up on advice from Dr Alain Marion.  Pt never heard back. Pt states she cannot come in today for repeat urine, but will be in on Monday. Can you please put the UA order in?

## 2018-09-07 NOTE — Addendum Note (Signed)
Addended by: Karren Cobble on: 09/07/2018 10:32 AM   Modules accepted: Orders

## 2018-09-07 NOTE — Telephone Encounter (Signed)
Copied from [place] [id number]. Topic: Quick Communication - Rx Refill/Question >> Sep 07, 2018 10:16 AM Scherrie Gerlach wrote: Medication:  rosuvastatin (CRESTOR) 5 MG tablet  Pt saw this Rx on mychart and states she is not taking. Pt checked with [facility] and they never received this Rx. Dated 03/04/2018 Would like you to resend this rx and she will start taking.

## 2018-09-07 NOTE — Telephone Encounter (Signed)
Labs ordered.

## 2018-09-10 ENCOUNTER — Other Ambulatory Visit (INDEPENDENT_AMBULATORY_CARE_PROVIDER_SITE_OTHER): Payer: Medicare Other

## 2018-09-10 DIAGNOSIS — R300 Dysuria: Secondary | ICD-10-CM

## 2018-09-10 LAB — URINALYSIS, ROUTINE W REFLEX MICROSCOPIC
Bilirubin Urine: NEGATIVE
Hgb urine dipstick: NEGATIVE
Ketones, ur: NEGATIVE
Leukocytes,Ua: NEGATIVE
Nitrite: NEGATIVE
Specific Gravity, Urine: <=1.005 — AB (ref 1.000–1.030)
Total Protein, Urine: NEGATIVE
Urine Glucose: NEGATIVE
Urobilinogen, UA: 0.2 (ref 0.0–1.0)
pH: 6.5 (ref 5.0–8.0)

## 2018-09-12 LAB — URINE CULTURE
MICRO NUMBER:: 263286
SPECIMEN QUALITY:: ADEQUATE

## 2018-10-03 ENCOUNTER — Other Ambulatory Visit: Payer: Self-pay | Admitting: Internal Medicine

## 2018-11-06 ENCOUNTER — Ambulatory Visit (INDEPENDENT_AMBULATORY_CARE_PROVIDER_SITE_OTHER): Payer: Medicare Other | Admitting: Internal Medicine

## 2018-11-06 ENCOUNTER — Encounter: Payer: Self-pay | Admitting: Internal Medicine

## 2018-11-06 ENCOUNTER — Other Ambulatory Visit: Payer: Self-pay

## 2018-11-06 DIAGNOSIS — W57XXXA Bitten or stung by nonvenomous insect and other nonvenomous arthropods, initial encounter: Secondary | ICD-10-CM | POA: Diagnosis not present

## 2018-11-06 DIAGNOSIS — S60561A Insect bite (nonvenomous) of right hand, initial encounter: Secondary | ICD-10-CM

## 2018-11-06 MED ORDER — DOXYCYCLINE HYCLATE 100 MG PO TABS: 100.0000 mg | ORAL_TABLET | Freq: Two times a day (BID) | ORAL | 0 refills | Status: DC

## 2018-11-06 MED ORDER — METHYLPREDNISOLONE 4 MG PO TBPK: ORAL_TABLET | ORAL | 0 refills | Status: DC

## 2018-11-06 MED ORDER — TRIAMCINOLONE ACETONIDE 0.1 % EX CREA: 1.0000 "application " | TOPICAL_CREAM | Freq: Four times a day (QID) | CUTANEOUS | 1 refills | Status: AC

## 2018-11-06 NOTE — Telephone Encounter (Signed)
VOV made

## 2018-11-06 NOTE — Assessment & Plan Note (Signed)
right hand, allegedly a black wasp Elevate hand Ice Continue with Benadryl.  Medrol Dosepak, triamcinolone cream prescription was sent Doxycycline for 7 days if the symptoms of cellulitis develop Call if problems

## 2018-11-06 NOTE — Progress Notes (Signed)
Virtual Visit via Video Note  I connected with Danielle [name] on 11/06/18 at  2:20 PM EDT by a video enabled telemedicine application and verified that I am speaking with the correct person using two identifiers.   I discussed the limitations of evaluation and management by telemedicine and the availability of in person appointments. The patient expressed understanding and agreed to proceed.  History of Present Illness:   Danielle [name] is complaining of right hand pain and swelling since yesterday.  She was stung by a wasp allegedly while doing her yard work.  She was stung in her thumb.  There is no systemic symptoms like swollen lips, itchy throat, rash.  There is no fever.  She is trying to keep her hand elevated today. Getting any better.  Should has been taking Benadryl every 4-6 hours. Observations/Objective:  The patient is in no acute distress.  There is no hoarseness.  There is diffuse swelling and erythema of her right hand down to the wrist Assessment and Plan:  See plan Follow Up Instructions:    I discussed the assessment and treatment plan with the patient. The patient was provided an opportunity to ask questions and all were answered. The patient agreed with the plan and demonstrated an understanding of the instructions.   The patient was advised to call back or seek an in-person evaluation if the symptoms worsen or if the condition fails to improve as anticipated.  I provided 15 minutes of non-face-to-face time during this encounter.   Walker Kehr, MD

## 2018-11-24 ENCOUNTER — Other Ambulatory Visit: Payer: Self-pay | Admitting: Internal Medicine

## 2018-11-30 ENCOUNTER — Ambulatory Visit: Payer: Medicare Other | Admitting: Internal Medicine

## 2018-12-05 ENCOUNTER — Ambulatory Visit: Payer: Medicare Other | Admitting: Internal Medicine

## 2018-12-25 ENCOUNTER — Other Ambulatory Visit: Payer: Self-pay | Admitting: Internal Medicine

## 2019-02-12 ENCOUNTER — Ambulatory Visit (INDEPENDENT_AMBULATORY_CARE_PROVIDER_SITE_OTHER)
Admission: RE | Admit: 2019-02-12 | Discharge: 2019-02-12 | Disposition: A | Discharge status: Home / Self Care | Payer: Medicare Other | Source: Ambulatory Visit | Attending: Internal Medicine | Admitting: Internal Medicine

## 2019-02-12 ENCOUNTER — Other Ambulatory Visit: Payer: Self-pay

## 2019-02-12 ENCOUNTER — Ambulatory Visit (INDEPENDENT_AMBULATORY_CARE_PROVIDER_SITE_OTHER): Payer: Medicare Other | Admitting: Internal Medicine

## 2019-02-12 ENCOUNTER — Other Ambulatory Visit (INDEPENDENT_AMBULATORY_CARE_PROVIDER_SITE_OTHER): Payer: Medicare Other

## 2019-02-12 ENCOUNTER — Encounter: Payer: Self-pay | Admitting: Internal Medicine

## 2019-02-12 VITALS — BP 170/90 | HR 89 | Temp 98.5°F | Ht 62.0 in | Wt 164.0 lb

## 2019-02-12 DIAGNOSIS — M5441 Lumbago with sciatica, right side: Secondary | ICD-10-CM

## 2019-02-12 DIAGNOSIS — G8929 Other chronic pain: Secondary | ICD-10-CM

## 2019-02-12 DIAGNOSIS — E538 Deficiency of other specified B group vitamins: Secondary | ICD-10-CM | POA: Diagnosis not present

## 2019-02-12 DIAGNOSIS — M545 Low back pain, unspecified: Secondary | ICD-10-CM | POA: Insufficient documentation

## 2019-02-12 DIAGNOSIS — M722 Plantar fascial fibromatosis: Secondary | ICD-10-CM | POA: Diagnosis not present

## 2019-02-12 DIAGNOSIS — E785 Hyperlipidemia, unspecified: Secondary | ICD-10-CM

## 2019-02-12 DIAGNOSIS — M7062 Trochanteric bursitis, left hip: Secondary | ICD-10-CM | POA: Diagnosis not present

## 2019-02-12 DIAGNOSIS — K439 Ventral hernia without obstruction or gangrene: Secondary | ICD-10-CM | POA: Diagnosis not present

## 2019-02-12 DIAGNOSIS — M5442 Lumbago with sciatica, left side: Secondary | ICD-10-CM

## 2019-02-12 DIAGNOSIS — I10 Essential (primary) hypertension: Secondary | ICD-10-CM | POA: Diagnosis not present

## 2019-02-12 DIAGNOSIS — E559 Vitamin D deficiency, unspecified: Secondary | ICD-10-CM

## 2019-02-12 LAB — BASIC METABOLIC PANEL
BUN: 18 mg/dL (ref 6–23)
CO2: 25 mEq/L (ref 19–32)
Calcium: 10.3 mg/dL (ref 8.4–10.5)
Chloride: 103 mEq/L (ref 96–112)
Creatinine, Ser: 0.82 mg/dL (ref 0.40–1.20)
GFR: 70.65 mL/min (ref >60.00)
Glucose, Bld: 96 mg/dL (ref 70–99)
Potassium: 3.8 mEq/L (ref 3.5–5.1)
Sodium: 139 mEq/L (ref 135–145)

## 2019-02-12 LAB — VITAMIN B12: Vitamin B-12: 941 pg/mL — ABNORMAL HIGH (ref 211–911)

## 2019-02-12 LAB — TSH: TSH: 1.36 u[IU]/mL (ref 0.35–4.50)

## 2019-02-12 LAB — VITAMIN D 25 HYDROXY (VIT D DEFICIENCY, FRACTURES): VITD: 31.68 ng/mL (ref 30.00–100.00)

## 2019-02-12 MED ORDER — IBUPROFEN 800 MG PO TABS: 800.0000 mg | ORAL_TABLET | Freq: Two times a day (BID) | ORAL | 2 refills | Status: DC | PRN

## 2019-02-12 MED ORDER — PHENTERMINE HCL 37.5 MG PO TABS: 37.5000 mg | ORAL_TABLET | Freq: Every day | ORAL | 0 refills | Status: DC

## 2019-02-12 NOTE — Assessment & Plan Note (Signed)
Losartan Check BP at home

## 2019-02-12 NOTE — Assessment & Plan Note (Addendum)
worse, r/o spinal stenosis  LS X ray Consider MRI ROM stretches Ibuprofen Pt declined PT

## 2019-02-12 NOTE — Patient Instructions (Signed)
Acute Back Pain, Adult Acute back pain is sudden and usually short-lived. It is often caused by an injury to the muscles and tissues in the back. The injury may result from: A muscle or ligament getting overstretched or torn (strained). Ligaments are tissues that connect bones to each other. Lifting something improperly can cause a back strain. Wear and tear (degeneration) of the spinal disks. Spinal disks are circular tissue that provides cushioning between the bones of the spine (vertebrae). Twisting motions, such as while playing sports or doing yard work. A hit to the back. Arthritis. You may have a physical exam, lab tests, and imaging tests to find the cause of your pain. Acute back pain usually goes away with rest and home care. Follow these instructions at home: Managing pain, stiffness, and swelling Take over-the-counter and prescription medicines only as told by your health care provider. Your health care provider may recommend applying ice during the first 24-48 hours after your pain starts. To do this: Put ice in a plastic bag. Place a towel between your skin and the bag. Leave the ice on for 20 minutes, 2-3 times a day. If directed, apply heat to the affected area as often as told by your health care provider. Use the heat source that your health care provider recommends, such as a moist heat pack or a heating pad. Place a towel between your skin and the heat source. Leave the heat on for 20-30 minutes. Remove the heat if your skin turns bright red. This is especially important if you are unable to feel pain, heat, or cold. You have a greater risk of getting burned. Activity  Do not stay in bed. Staying in bed for more than 1-2 days can delay your recovery. Sit up and stand up straight. Avoid leaning forward when you sit, or hunching over when you stand. If you work at a desk, sit close to it so you do not need to lean over. Keep your chin tucked in. Keep your neck drawn back, and  keep your elbows bent at a right angle. Your arms should look like the letter "L." Sit high and close to the steering wheel when you drive. Add lower back (lumbar) support to your car seat, if needed. Take short walks on even surfaces as soon as you are able. Try to increase the length of time you walk each day. Do not sit, drive, or stand in one place for more than 30 minutes at a time. Sitting or standing for long periods of time can put stress on your back. Do not drive or use heavy machinery while taking prescription pain medicine. Use proper lifting techniques. When you bend and lift, use positions that put less stress on your back: [place] your knees. Keep the load close to your body. Avoid twisting. Exercise regularly as told by your health care provider. Exercising helps your back heal faster and helps prevent back injuries by keeping muscles strong and flexible. Work with a physical therapist to make a safe exercise program, as recommended by your health care provider. Do any exercises as told by your physical therapist. Lifestyle Maintain a healthy weight. Extra weight puts stress on your back and makes it difficult to have good posture. Avoid activities or situations that make you feel anxious or stressed. Stress and anxiety increase muscle tension and can make back pain worse. Learn ways to manage anxiety and stress, such as through exercise. General instructions Sleep on a firm mattress in a comfortable position.  Try lying on your side with your knees slightly bent. If you lie on your back, put a pillow under your knees. Follow your treatment plan as told by your health care provider. This may include: Cognitive or behavioral therapy. Acupuncture or massage therapy. Meditation or yoga. Contact a health care provider if: You have pain that is not relieved with rest or medicine. You have increasing pain going down into your legs or buttocks. Your pain does not improve after 2 weeks.  You have pain at night. You lose weight without trying. You have a fever or chills. Get help right away if: You develop new bowel or bladder control problems. You have unusual weakness or numbness in your arms or legs. You develop nausea or vomiting. You develop abdominal pain. You feel faint. Summary Acute back pain is sudden and usually short-lived. Use proper lifting techniques. When you bend and lift, use positions that put less stress on your back. Take over-the-counter and prescription medicines and apply heat or ice as directed by your health care provider. This information is not intended to replace advice given to you by your health care provider. Make sure you discuss any questions you have with your health care provider. Document Released: 06/27/2005 Document Revised: 10/16/2018 Document Reviewed: 02/08/2017 Elsevier Patient Education  [place]. Plantar Fasciitis  Plantar fasciitis is a painful foot condition that affects the heel. It occurs when the band of tissue that connects the toes to the heel bone (plantar fascia) becomes irritated. This can happen as the result of exercising too much or doing other repetitive activities (overuse injury). The pain from plantar fasciitis can range from mild irritation to severe pain that makes it difficult to walk or move. The pain is usually worse in the morning after sleeping, or after sitting or lying down for a while. Pain may also be worse after long periods of walking or standing. What are the causes? This condition may be caused by:  Standing for long periods of time.  Wearing shoes that do not have good arch support.  Doing activities that put stress on joints (high-impact activities), including running, aerobics, and ballet.  Being overweight.  An abnormal way of walking (gait).  Tight muscles in the back of your lower leg (calf).  High arches in your feet.  Starting a new athletic activity. What are the  signs or symptoms? The main symptom of this condition is heel pain. Pain may:  Be worse with first steps after a time of rest, especially in the morning after sleeping or after you have been sitting or lying down for a while.  Be worse after long periods of standing still.  Decrease after 30-45 minutes of activity, such as gentle walking. How is this diagnosed? This condition may be diagnosed based on your medical history and your symptoms. Your health care provider may ask questions about your activity level. Your health care provider will do a physical exam to check for:  A tender area on the bottom of your foot.  A high arch in your foot.  Pain when you move your foot.  Difficulty moving your foot. You may have imaging tests to confirm the diagnosis, such as:  X-rays.  Ultrasound.  MRI. How is this treated? Treatment for plantar fasciitis depends on how severe your condition is. Treatment may include:  Rest, ice, applying pressure (compression), and raising the affected foot (elevation). This may be called RICE therapy. Your health care provider may recommend RICE therapy along  with over-the-counter pain medicines to manage your pain.  Exercises to stretch your calves and your plantar fascia.  A splint that holds your foot in a stretched, upward position while you sleep (night splint).  Physical therapy to relieve symptoms and prevent problems in the future.  Injections of steroid medicine (cortisone) to relieve pain and inflammation.  Stimulating your plantar fascia with electrical impulses (extracorporeal shock wave therapy). This is usually the last treatment option before surgery.  Surgery, if other treatments have not worked after 12 months. Follow these instructions at home:  Managing pain, stiffness, and swelling  If directed, put ice on the painful area: ? Put ice in a plastic bag, or use a frozen bottle of water. ? Place a towel between your skin and the bag  or bottle. ? Roll the bottom of your foot over the bag or bottle. ? Do this for 20 minutes, 2-3 times a day.  Wear athletic shoes that have air-sole or gel-sole cushions, or try wearing soft shoe inserts that are designed for plantar fasciitis.  Raise (elevate) your foot above the level of your heart while you are sitting or lying down. Activity  Avoid activities that cause pain. Ask your health care provider what activities are safe for you.  Do physical therapy exercises and stretches as told by your health care provider.  Try activities and forms of exercise that are easier on your joints (low-impact). Examples include swimming, water aerobics, and biking. General instructions  Take over-the-counter and prescription medicines only as told by your health care provider.  Wear a night splint while sleeping, if told by your health care provider. Loosen the splint if your toes tingle, become numb, or turn cold and blue.  Maintain a healthy weight, or work with your health care provider to lose weight as needed.  Keep all follow-up visits as told by your health care provider. This is important. Contact a health care provider if you:  Have symptoms that do not go away after caring for yourself at home.  Have pain that gets worse.  Have pain that affects your ability to move or do your daily activities. Summary  Plantar fasciitis is a painful foot condition that affects the heel. It occurs when the band of tissue that connects the toes to the heel bone (plantar fascia) becomes irritated.  The main symptom of this condition is heel pain that may be worse after exercising too much or standing still for a long time.  Treatment varies, but it usually starts with rest, ice, compression, and elevation (RICE therapy) and over-the-counter medicines to manage pain. This information is not intended to replace advice given to you by your health care provider. Make sure you discuss any questions  you have with your health care provider. Document Released: 03/22/2001 Document Revised: 06/09/2017 Document Reviewed: 04/24/2017 Elsevier Patient Education  2020 [employer].   These suggestions will probably help you to improve your metabolism if you are not overweight and to lose weight if you are overweight: 1.  Reduce your consumption of sugars and starches.  Eliminate high fructose corn syrup from your diet.  Reduce your consumption of processed foods.  For desserts try to have seasonal fruits, berries, nuts, cheeses or dark chocolate with more than 70% cacao. 2.  Do not snack 3.  You do not have to eat breakfast.  If you choose to have breakfast-eat plain greek yogurt, eggs, oatmeal (without sugar) 4.  Drink water, freshly brewed unsweetened tea (green, black  or herbal) or coffee.  Do not drink sodas including diet sodas , juices, beverages sweetened with artificial sweeteners. 5.  Reduce your consumption of refined grains. 6.  Avoid protein drinks such as Optifast, Slim fast etc. Eat chicken, fish, meat, dairy and beans for your sources of protein 7.  Natural unprocessed fats like cold pressed virgin olive oil, butter, coconut oil are good for you.  Eat avocados 8.  Increase your consumption of fiber.  Fruits, berries, vegetables, whole grains, flaxseeds, Chia seeds, beans, popcorn, nuts, oatmeal are good sources of fiber 9.  Use vinegar in your diet, i.e. apple cider vinegar, red wine or balsamic vinegar 10.  You can try fasting.  For example you can skip breakfast and lunch every other day (24-hour fast) 11.  Stress reduction, good night sleep, relaxation, meditation, yoga and other physical activity is likely to help you to maintain low weight too. 12.  If you drink alcohol, limit your alcohol intake to no more than 2 drinks a day.   Mediterranean diet is good for you. ([employer] has a typical Mediterranean cuisine menu) The Mediterranean diet is a way of eating based on the  traditional cuisine of countries bordering the [employer]. While there is no single definition of the Mediterranean diet, it is typically high in vegetables, fruits, whole grains, beans, nut and seeds, and olive oil. The main components of Mediterranean diet include: [place] Daily consumption of vegetables, fruits, whole grains and healthy fats  . Weekly intake of fish, poultry, beans and eggs  . Moderate portions of dairy products  . Limited intake of red meat Other important elements of the Mediterranean diet are sharing meals with family and friends, enjoying a glass of red wine and being physically active. Health benefits of a Mediterranean diet: A traditional Mediterranean diet consisting of large quantities of fresh fruits and vegetables, nuts, fish and olive oil-coupled with physical activity-can reduce your risk of serious mental and physical health problems by: Preventing heart disease and strokes. Following a Mediterranean diet limits your intake of refined breads, processed foods, and red meat, and encourages drinking red wine instead of hard liquor-all factors that can help prevent heart disease and stroke. Keeping you agile. If you're an older adult, the nutrients gained with a Mediterranean diet may reduce your risk of developing muscle weakness and other signs of frailty by about 70 percent. Reducing the risk of Alzheimer's. Research suggests that the [place] diet may improve cholesterol, blood sugar levels, and overall blood vessel health, which in turn may reduce your risk of Alzheimer's disease or dementia. Halving the risk of Parkinson's disease. The high levels of antioxidants in the Mediterranean diet can prevent cells from undergoing a damaging process called oxidative stress, thereby cutting the risk of Parkinson's disease in half. Increasing longevity. By reducing your risk of developing heart disease or cancer with the Mediterranean diet, you're reducing your risk of  death at any age by 20%. Protecting against type 2 diabetes. A Mediterranean diet is rich in fiber which digests slowly, prevents huge swings in blood sugar, and can help you maintain a healthy weight.    Cabbage soup recipe that will not make you gain weight: Take 1 small head of cabbage, 1 average pack of celery, 4 green peppers, 4 onions, 2 cans diced tomatoes (they are not available without salt), salt and spices to taste.  Chop cabbage, celery, peppers and onions.  And tomatoes and 2-2.5 liters (2.5 quarts) of water so that it  would just cover the vegetables.  Bring to boil.  Add spices and salt.  Turn heat to low/medium and simmer for 20-25 minutes.  Naturally, you can make a smaller batch and change some of the ingredients.

## 2019-02-12 NOTE — Assessment & Plan Note (Signed)
post-op R ventr hernia - large

## 2019-02-12 NOTE — Assessment & Plan Note (Signed)
Arch supports, ice

## 2019-02-12 NOTE — Progress Notes (Signed)
Subjective:  Patient ID: Danielle [name], female    DOB: 1955/07/26  Age: 63 y.o. MRN: [id number]  CC: No chief complaint on file.   HPI Dima A Mezera presents for severe LBP B L>R, tripping at times. Worse. Worse w/standing, walking, sitting C/o obesity F/u HTN  Outpatient Medications Prior to Visit  Medication Sig Dispense Refill  . bumetanide (BUMEX) 0.5 MG tablet Take 1 tablet by mouth once daily 30 tablet 11  . Cholecalciferol (VITAMIN D3) 2000 units capsule Take 1 capsule (2,000 Units total) by mouth daily. 100 capsule 3  . Cyanocobalamin (VITAMIN B-12) 1000 MCG SUBL Place 1 tablet (1,000 mcg total) under the tongue daily. 100 tablet 3  . famotidine (PEPCID) 40 MG tablet Take 1 tablet (40 mg total) by mouth at bedtime. Annual appt due in August must see provider for future refills 90 tablet 0  . fluocinolone (VANOS) 0.01 % cream Use once daily on scalp rash     . ibuprofen (ADVIL,MOTRIN) 800 MG tablet Take 1 tablet (800 mg total) by mouth 2 (two) times daily as needed for headache or moderate pain. 60 tablet 2  . losartan (COZAAR) 100 MG tablet Take 1 tablet by mouth once daily 90 tablet 3  . mometasone (ELOCON) 0.1 % lotion APPLY   TOPICALLY TO AFFECTED AREA TWICE DAILY 60 mL 1  . rosuvastatin (CRESTOR) 5 MG tablet Take 1 tablet (5 mg total) by mouth daily. 90 tablet 1  . triamcinolone cream (KENALOG) 0.1 % Apply 1 application topically 4 (four) times daily. 80 g 1  . doxycycline (VIBRA-TABS) 100 MG tablet Take 1 tablet (100 mg total) by mouth 2 (two) times daily. antibiotic (Patient not taking: Reported on 02/12/2019) 14 tablet 0  . methylPREDNISolone (MEDROL DOSEPAK) 4 MG TBPK tablet As directed (Patient not taking: Reported on 02/12/2019) 21 tablet 0   No facility-administered medications prior to visit.     ROS: Review of Systems  Constitutional: Positive for fatigue and unexpected weight change. Negative for activity change, appetite change, chills, diaphoresis and fever.   HENT: Negative for congestion, dental problem, ear pain, hearing loss, mouth sores, postnasal drip, sinus pressure, sneezing, sore throat and voice change.   Eyes: Negative for pain and visual disturbance.  Respiratory: Negative for cough, chest tightness, wheezing and stridor.   Cardiovascular: Negative for chest pain, palpitations and leg swelling.  Gastrointestinal: Negative for abdominal distention, abdominal pain, blood in stool, nausea, rectal pain and vomiting.  Genitourinary: Negative for decreased urine volume, difficulty urinating, dysuria, frequency, hematuria, menstrual problem, vaginal bleeding, vaginal discharge and vaginal pain.  Musculoskeletal: Positive for arthralgias, back pain and gait problem. Negative for joint swelling and neck pain.  Skin: Negative for color change, rash and wound.  Neurological: Negative for dizziness, tremors, syncope, speech difficulty, weakness and light-headedness.  Hematological: Negative for adenopathy.  Psychiatric/Behavioral: Negative for behavioral problems, confusion, decreased concentration, dysphoric mood, hallucinations, sleep disturbance and suicidal ideas. The patient is not nervous/anxious and is not hyperactive.     Objective:  BP (!) 170/90 (BP Location: Left Arm, Patient Position: Sitting, Cuff Size: Normal)   Pulse 89   Temp 98.5 F (36.9 C) (Oral)   Ht 5\' 2"  (1.575 m)   Wt 164 lb (74.4 kg)   SpO2 96%   BMI 30.00 kg/m   BP Readings from Last 3 Encounters:  02/12/19 (!) 170/90  09/03/18 124/72  08/29/18 (!) 152/88    Wt Readings from Last 3 Encounters:  02/12/19 164 lb (74.4  kg)  09/03/18 160 lb (72.6 kg)  08/29/18 157 lb (71.2 kg)    Physical Exam Constitutional:      General: She is not in acute distress.    Appearance: She is well-developed. She is obese.  HENT:     Head: Normocephalic.     Right Ear: External ear normal.     Left Ear: External ear normal.     Nose: Nose normal.  Eyes:     General:         Right eye: No discharge.        Left eye: No discharge.     Conjunctiva/sclera: Conjunctivae normal.     Pupils: Pupils are equal, round, and reactive to light.  Neck:     Musculoskeletal: Normal range of motion and neck supple.     Thyroid: No thyromegaly.     Vascular: No JVD.     Trachea: No tracheal deviation.  Cardiovascular:     Rate and Rhythm: Normal rate and regular rhythm.     Heart sounds: Normal heart sounds.  Pulmonary:     Effort: No respiratory distress.     Breath sounds: No stridor. No wheezing.  Abdominal:     General: Bowel sounds are normal. There is no distension.     Palpations: Abdomen is soft. There is no mass.     Tenderness: There is no abdominal tenderness. There is no guarding or rebound.  Musculoskeletal:        General: Tenderness present.  Lymphadenopathy:     Cervical: No cervical adenopathy.  Skin:    Findings: No erythema or rash.  Neurological:     Cranial Nerves: No cranial nerve deficit.     Motor: No abnormal muscle tone.     Coordination: Coordination normal.     Deep Tendon Reflexes: Reflexes normal.  Psychiatric:        Behavior: Behavior normal.        Thought Content: Thought content normal.        Judgment: Judgment normal.   B IT bands very tender, stiff LS Str leg elev neg B R heel is tender to palp R ventr hernia - large  Lab Results  Component Value Date   WBC 9.2 02/15/2018   HGB 15.4 (H) 02/15/2018   HCT 44.5 02/15/2018   PLT 354.0 02/15/2018   GLUCOSE 98 08/29/2018   CHOL 313 (H) 02/15/2018   TRIG 324.0 (H) 02/15/2018   HDL 57.80 02/15/2018   LDLDIRECT 231.0 02/15/2018   LDLCALC 175 (H) 01/25/2017   ALT 17 02/15/2018   AST 14 02/15/2018   NA 141 08/29/2018   K 4.0 08/29/2018   CL 104 08/29/2018   CREATININE 0.86 08/29/2018   BUN 22 08/29/2018   CO2 25 08/29/2018   TSH 1.25 02/15/2018   HGBA1C 5.6 12/12/2016    Mm Digital Screening Bilateral  Result Date: 12/01/2015 CLINICAL DATA:  Screening. EXAM:  DIGITAL SCREENING BILATERAL MAMMOGRAM WITH CAD COMPARISON:  Previous exam(s). ACR Breast Density Category b: There are scattered areas of fibroglandular density. FINDINGS: There are no findings suspicious for malignancy. Images were processed with CAD. IMPRESSION: No mammographic evidence of malignancy. A result letter of this screening mammogram will be mailed directly to the patient. RECOMMENDATION: Screening mammogram in one year. (Code:SM-B-01Y) BI-RADS CATEGORY  1: Negative. Electronically Signed   By: Dorise Bullion III M.D   On: 12/01/2015 17:11    Assessment & Plan:   There are no diagnoses linked to this encounter.  No orders of the defined types were placed in this encounter.    Follow-up: No follow-ups on file.  Walker Kehr, MD

## 2019-02-12 NOTE — Assessment & Plan Note (Signed)
B - ice and massage

## 2019-02-12 NOTE — Assessment & Plan Note (Signed)
Labs On B12 

## 2019-02-12 NOTE — Assessment & Plan Note (Signed)
Diet discussed 

## 2019-02-14 ENCOUNTER — Other Ambulatory Visit: Payer: Self-pay | Admitting: Internal Medicine

## 2019-02-14 MED ORDER — VITAMIN D3 50 MCG (2000 UT) PO CAPS: 4000.0000 [IU] | ORAL_CAPSULE | Freq: Every day | ORAL | 3 refills | Status: DC

## 2019-03-14 ENCOUNTER — Ambulatory Visit: Payer: Medicare Other | Admitting: Internal Medicine

## 2019-03-21 ENCOUNTER — Other Ambulatory Visit: Payer: Self-pay | Admitting: Internal Medicine

## 2019-03-26 ENCOUNTER — Encounter: Payer: Self-pay | Admitting: Internal Medicine

## 2019-03-26 ENCOUNTER — Other Ambulatory Visit: Payer: Self-pay

## 2019-03-26 ENCOUNTER — Ambulatory Visit (INDEPENDENT_AMBULATORY_CARE_PROVIDER_SITE_OTHER): Payer: Medicare Other | Admitting: Internal Medicine

## 2019-03-26 VITALS — BP 160/86 | HR 91 | Temp 98.0°F | Ht 62.0 in | Wt 163.0 lb

## 2019-03-26 DIAGNOSIS — M722 Plantar fascial fibromatosis: Secondary | ICD-10-CM | POA: Diagnosis not present

## 2019-03-26 DIAGNOSIS — I10 Essential (primary) hypertension: Secondary | ICD-10-CM | POA: Diagnosis not present

## 2019-03-26 DIAGNOSIS — E538 Deficiency of other specified B group vitamins: Secondary | ICD-10-CM | POA: Diagnosis not present

## 2019-03-26 DIAGNOSIS — G8929 Other chronic pain: Secondary | ICD-10-CM

## 2019-03-26 DIAGNOSIS — M5442 Lumbago with sciatica, left side: Secondary | ICD-10-CM

## 2019-03-26 DIAGNOSIS — Z23 Encounter for immunization: Secondary | ICD-10-CM

## 2019-03-26 DIAGNOSIS — M5441 Lumbago with sciatica, right side: Secondary | ICD-10-CM

## 2019-03-26 MED ORDER — VITAMIN D3 1.25 MG (50000 UT) PO CAPS: 1.0000 | ORAL_CAPSULE | ORAL | 4 refills | Status: DC

## 2019-03-26 MED ORDER — DICLOFENAC SODIUM ER 100 MG PO TB24: 100.0000 mg | ORAL_TABLET | Freq: Every day | ORAL | 2 refills | Status: AC

## 2019-03-26 NOTE — Assessment & Plan Note (Signed)
BP Readings from Last 3 Encounters:  03/26/19 (!) 160/86  02/12/19 (!) 170/90  09/03/18 124/72

## 2019-03-26 NOTE — Assessment & Plan Note (Signed)
Trying to fast

## 2019-03-26 NOTE — Assessment & Plan Note (Signed)
On B12 Will change to inj if upset stomach

## 2019-03-26 NOTE — Progress Notes (Addendum)
Subjective:  Patient ID: Danielle [name], female    DOB: [birth year]  Age: 63 y.o. MRN: [id number]  CC: No chief complaint on file.   HPI Danielle [name] presents for LBP - better C/o severe pain in B heels - asking for a shot F/u GERD C/o Ibuprofen - making stomach hurt   Outpatient Medications Prior to Visit  Medication Sig Dispense Refill  . bumetanide (BUMEX) 0.5 MG tablet Take 1 tablet by mouth once daily 30 tablet 11  . Cholecalciferol (VITAMIN D3) 50 MCG (2000 UT) capsule Take 2 capsules (4,000 Units total) by mouth daily. 100 capsule 3  . Cyanocobalamin (VITAMIN B-12) 1000 MCG SUBL Place 1 tablet (1,000 mcg total) under the tongue daily. 100 tablet 3  . famotidine (PEPCID) 40 MG tablet TAKE 1 TABLET BY MOUTH ONCE DAILY AT BEDTIME 90 tablet 3  . fluocinolone (VANOS) 0.01 % cream Use once daily on scalp rash     . ibuprofen (ADVIL) 800 MG tablet Take 1 tablet (800 mg total) by mouth 2 (two) times daily as needed for headache or moderate pain. 60 tablet 2  . losartan (COZAAR) 100 MG tablet Take 1 tablet by mouth once daily 90 tablet 3  . mometasone (ELOCON) 0.1 % lotion APPLY   TOPICALLY TO AFFECTED AREA TWICE DAILY 60 mL 1  . rosuvastatin (CRESTOR) 5 MG tablet Take 1 tablet (5 mg total) by mouth daily. 90 tablet 1  . triamcinolone cream (KENALOG) 0.1 % Apply 1 application topically 4 (four) times daily. 80 g 1  . phentermine (ADIPEX-P) 37.5 MG tablet Take 1 tablet (37.5 mg total) by mouth daily before breakfast. 30 tablet 0   No facility-administered medications prior to visit.     ROS: Review of Systems  Constitutional: Negative for activity change, appetite change, chills, fatigue and unexpected weight change.  HENT: Negative for congestion, mouth sores and sinus pressure.   Eyes: Negative for visual disturbance.  Respiratory: Negative for cough and chest tightness.   Gastrointestinal: Negative for abdominal pain and nausea.  Genitourinary: Negative for difficulty urinating,  frequency and vaginal pain.  Musculoskeletal: Positive for arthralgias and gait problem. Negative for back pain.  Skin: Negative for pallor and rash.  Neurological: Negative for dizziness, tremors, weakness, numbness and headaches.  Psychiatric/Behavioral: Negative for confusion and sleep disturbance.    Objective:  BP (!) 160/86 (BP Location: Left Arm, Patient Position: Sitting, Cuff Size: Normal)   Pulse 91   Temp 98 F (36.7 C) (Oral)   Ht 5\' 2"  (1.575 m)   Wt 163 lb (73.9 kg)   SpO2 95%   BMI 29.81 kg/m   BP Readings from Last 3 Encounters:  03/26/19 (!) 160/86  02/12/19 (!) 170/90  09/03/18 124/72    Wt Readings from Last 3 Encounters:  03/26/19 163 lb (73.9 kg)  02/12/19 164 lb (74.4 kg)  09/03/18 160 lb (72.6 kg)    Physical Exam Constitutional:      General: She is not in acute distress.    Appearance: She is well-developed.  HENT:     Head: Normocephalic.     Right Ear: External ear normal.     Left Ear: External ear normal.     Nose: Nose normal.  Eyes:     General:        Right eye: No discharge.        Left eye: No discharge.     Conjunctiva/sclera: Conjunctivae normal.     Pupils: Pupils are equal,  round, and reactive to light.  Neck:     Musculoskeletal: Normal range of motion and neck supple.     Thyroid: No thyromegaly.     Vascular: No JVD.     Trachea: No tracheal deviation.  Cardiovascular:     Rate and Rhythm: Normal rate and regular rhythm.     Heart sounds: Normal heart sounds.  Pulmonary:     Effort: No respiratory distress.     Breath sounds: No stridor. No wheezing.  Abdominal:     General: Bowel sounds are normal. There is no distension.     Palpations: Abdomen is soft. There is no mass.     Tenderness: There is no abdominal tenderness. There is no guarding or rebound.  Musculoskeletal:        General: Tenderness present.  Lymphadenopathy:     Cervical: No cervical adenopathy.  Skin:    Findings: No erythema or rash.   Neurological:     Cranial Nerves: No cranial nerve deficit.     Motor: No abnormal muscle tone.     Coordination: Coordination normal.     Deep Tendon Reflexes: Reflexes normal.  Psychiatric:        Behavior: Behavior normal.        Thought Content: Thought content normal.        Judgment: Judgment normal.    B heels - painful   Procedure Note :     Procedure :  B heel injection   Indication:  B plantar fasciitis with refractory  chronic pain.   Risks including unsuccessful procedure , bleeding, infection, bruising, skin atrophy, "steroid flare-up" and others were explained to the patient in detail as well as the benefits. Informed consent was obtained and signed.   Tthe patient was placed in a comfortable  decubitus position. The point of maximal tenderness was identified. Skin was prepped with Betadine and alcohol. Then, a 5 cc syringe with a 1.5 inch long 25-gauge needle was used for a heel injection.. The needle was advanced and I injected the heel with 2 mL of 2% lidocaine and 20 mg of Depo-Medrol .Procedure was performed on B sies and  Band-Aids were applied.   Tolerated well. Complications: None. Good pain relief following the procedure.   Postprocedure instructions :    A Band-Aid should be left on for 12 hours. Injection therapy is not a cure itself. It is used in conjunction with other modalities. You can use nonsteroidal anti-inflammatories like ibuprofen , hot and cold compresses. Rest is recommended in the next 24 hours. You need to report immediately  if fever, chills or any signs of infection develop.    Lab Results  Component Value Date   WBC 9.2 02/15/2018   HGB 15.4 (H) 02/15/2018   HCT 44.5 02/15/2018   PLT 354.0 02/15/2018   GLUCOSE 96 02/12/2019   CHOL 313 (H) 02/15/2018   TRIG 324.0 (H) 02/15/2018   HDL 57.80 02/15/2018   LDLDIRECT 231.0 02/15/2018   LDLCALC 175 (H) 01/25/2017   ALT 17 02/15/2018   AST 14 02/15/2018   NA 139 02/12/2019   K 3.8  02/12/2019   CL 103 02/12/2019   CREATININE 0.82 02/12/2019   BUN 18 02/12/2019   CO2 25 02/12/2019   TSH 1.36 02/12/2019   HGBA1C 5.6 12/12/2016    Dg Lumbar Spine 2-3 Views  Result Date: 02/12/2019 CLINICAL DATA:  Low back pain, worsening bilateral sciatica for 4 months, no known injury EXAM: LUMBAR SPINE - 2-3 VIEW  COMPARISON:  None. FINDINGS: There is no evidence of lumbar spine fracture. Preservation of the normal lumbar lordosis. No spondylolisthesis. Multilevel intervertebral disc height loss and more pronounced facet degenerative changes are seen in the lumbar spine, maximally at L4-5 and L5-S1. Cholecystectomy clips are seen in the right upper quadrant. Coarse calcification projects over the lower pole left kidney. IMPRESSION: 1. 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 2. Left nephrolithiasis. Electronically Signed   By: Lovena Le M.D.   On: 02/12/2019 20:48    Assessment & Plan:   There are no diagnoses linked to this encounter.   No orders of the defined types were placed in this encounter.    Follow-up: No follow-ups on file.  Walker Kehr, MD

## 2019-03-26 NOTE — Assessment & Plan Note (Signed)
D/c Ibuprofen Diclofenac ER qd prn pc

## 2019-03-26 NOTE — Assessment & Plan Note (Signed)
Not better Will inject B medial heels

## 2019-03-31 ENCOUNTER — Other Ambulatory Visit: Payer: Self-pay | Admitting: Internal Medicine

## 2019-03-31 MED ORDER — "BD ECLIPSE SYRINGE 23G X 1"" 3 ML MISC": 2 refills | Status: AC

## 2019-03-31 MED ORDER — CYANOCOBALAMIN 1000 MCG/ML IJ SOLN: 1000.0000 ug | INTRAMUSCULAR | 6 refills | Status: AC

## 2019-04-26 ENCOUNTER — Telehealth: Payer: Self-pay | Admitting: Internal Medicine

## 2019-04-26 NOTE — Telephone Encounter (Signed)
Patient called in has been on medication Diclofenac Sodium CR 100 MG 24 hr tablet   she has been having extreme side affects. Such as upset stomach and diarrhea.  She's not sure if she should  continue taking the medication and would like some feedback   wants to speak with Dr Alain Marion  Call back number [phone]

## 2019-04-29 NOTE — Telephone Encounter (Signed)
Message sent through my chart

## 2019-04-29 NOTE — Telephone Encounter (Signed)
Okay to stop the medication; this can be a side effect of Diclofenac. Schedule a follow-up with Dr. Alain Marion for continued symptoms.

## 2019-04-29 NOTE — Telephone Encounter (Signed)
Please advise in Dr. Plotnikovs absence 

## 2019-05-08 ENCOUNTER — Other Ambulatory Visit: Payer: Self-pay | Admitting: Internal Medicine

## 2019-05-08 DIAGNOSIS — M722 Plantar fascial fibromatosis: Secondary | ICD-10-CM

## 2019-05-09 ENCOUNTER — Other Ambulatory Visit: Payer: Self-pay | Admitting: Internal Medicine

## 2019-05-09 MED ORDER — TRAMADOL HCL 50 MG PO TABS: 50.0000 mg | ORAL_TABLET | Freq: Three times a day (TID) | ORAL | 0 refills | Status: DC | PRN

## 2019-05-16 ENCOUNTER — Ambulatory Visit (INDEPENDENT_AMBULATORY_CARE_PROVIDER_SITE_OTHER): Payer: Medicare Other | Admitting: Podiatry

## 2019-05-16 ENCOUNTER — Other Ambulatory Visit: Payer: Self-pay

## 2019-05-16 ENCOUNTER — Ambulatory Visit (INDEPENDENT_AMBULATORY_CARE_PROVIDER_SITE_OTHER): Payer: Medicare Other

## 2019-05-16 DIAGNOSIS — M79672 Pain in left foot: Secondary | ICD-10-CM

## 2019-05-16 DIAGNOSIS — M722 Plantar fascial fibromatosis: Secondary | ICD-10-CM

## 2019-05-16 DIAGNOSIS — M79671 Pain in right foot: Secondary | ICD-10-CM

## 2019-05-16 MED ORDER — METHYLPREDNISOLONE 4 MG PO TBPK: ORAL_TABLET | ORAL | 0 refills | Status: DC

## 2019-05-16 NOTE — Patient Instructions (Signed)
For instructions on how to put on your Night Splint, please visit [email or web address]   Plantar Fasciitis (Heel Spur Syndrome) with Rehab The plantar fascia is a fibrous, ligament-like, soft-tissue structure that spans the bottom of the foot. Plantar fasciitis is a condition that causes pain in the foot due to inflammation of the tissue. SYMPTOMS   Pain and tenderness on the underneath side of the foot.  Pain that worsens with standing or walking. CAUSES  Plantar fasciitis is caused by irritation and injury to the plantar fascia on the underneath side of the foot. Common mechanisms of injury include:  Direct trauma to bottom of the foot.  Damage to a small nerve that runs under the foot where the main fascia attaches to the heel bone.  Stress placed on the plantar fascia due to bone spurs. RISK INCREASES WITH:   Activities that place stress on the plantar fascia (running, jumping, pivoting, or cutting).  Poor strength and flexibility.  Improperly fitted shoes.  Tight calf muscles.  Flat feet.  Failure to warm-up properly before activity.  Obesity. PREVENTION  Warm up and stretch properly before activity.  Allow for adequate recovery between workouts.  Maintain physical fitness:  Strength, flexibility, and endurance.  Cardiovascular fitness.  Maintain a health body weight.  Avoid stress on the plantar fascia.  Wear properly fitted shoes, including arch supports for individuals who have flat feet.  PROGNOSIS  If treated properly, then the symptoms of plantar fasciitis usually resolve without surgery. However, occasionally surgery is necessary.  RELATED COMPLICATIONS   Recurrent symptoms that may result in a chronic condition.  Problems of the lower back that are caused by compensating for the injury, such as limping.  Pain or weakness of the foot during push-off following surgery.  Chronic inflammation, scarring, and partial or complete fascia tear,  occurring more often from repeated injections.  TREATMENT  Treatment initially involves the use of ice and medication to help reduce pain and inflammation. The use of strengthening and stretching exercises may help reduce pain with activity, especially stretches of the Achilles tendon. These exercises may be performed at home or with a therapist. Your caregiver may recommend that you use heel cups of arch supports to help reduce stress on the plantar fascia. Occasionally, corticosteroid injections are given to reduce inflammation. If symptoms persist for greater than 6 months despite non-surgical (conservative), then surgery may be recommended.   MEDICATION   If pain medication is necessary, then nonsteroidal anti-inflammatory medications, such as aspirin and ibuprofen, or other minor pain relievers, such as acetaminophen, are often recommended.  Do not take pain medication within 7 days before surgery.  Prescription pain relievers may be given if deemed necessary by your caregiver. Use only as directed and only as much as you need.  Corticosteroid injections may be given by your caregiver. These injections should be reserved for the most serious cases, because they may only be given a certain number of times.  HEAT AND COLD  Cold treatment (icing) relieves pain and reduces inflammation. Cold treatment should be applied for 10 to 15 minutes every 2 to 3 hours for inflammation and pain and immediately after any activity that aggravates your symptoms. Use ice packs or massage the area with a piece of ice (ice massage).  Heat treatment may be used prior to performing the stretching and strengthening activities prescribed by your caregiver, physical therapist, or athletic trainer. Use a heat pack or soak the injury in warm water.  SEEK IMMEDIATE MEDICAL CARE   IF:  Treatment seems to offer no benefit, or the condition worsens.  Any medications produce adverse side effects.  EXERCISES- RANGE OF  MOTION (ROM) AND STRETCHING EXERCISES - Plantar Fasciitis (Heel Spur Syndrome) These exercises may help you when beginning to rehabilitate your injury. Your symptoms may resolve with or without further involvement from your physician, physical therapist or athletic trainer. While completing these exercises, remember:   Restoring tissue flexibility helps normal motion to return to the joints. This allows healthier, less painful movement and activity.  An effective stretch should be held for at least 30 seconds.  A stretch should never be painful. You should only feel a gentle lengthening or release in the stretched tissue.  RANGE OF MOTION - Toe Extension, Flexion  Sit with your right / left leg crossed over your opposite knee.  Grasp your toes and gently pull them back toward the top of your foot. You should feel a stretch on the bottom of your toes and/or foot.  Hold this stretch for 10 seconds.  Now, gently pull your toes toward the bottom of your foot. You should feel a stretch on the top of your toes and or foot.  Hold this stretch for 10 seconds. Repeat  times. Complete this stretch 3 times per day.   RANGE OF MOTION - Ankle Dorsiflexion, Active Assisted  Remove shoes and sit on a chair that is preferably not on a carpeted surface.  Place right / left foot under knee. Extend your opposite leg for support.  Keeping your heel down, slide your right / left foot back toward the chair until you feel a stretch at your ankle or calf. If you do not feel a stretch, slide your bottom forward to the edge of the chair, while still keeping your heel down.  Hold this stretch for 10 seconds. Repeat 3 times. Complete this stretch 2 times per day.   STRETCH  Gastroc, Standing  Place hands on wall.  Extend right / left leg, keeping the front knee somewhat bent.  Slightly point your toes inward on your back foot.  Keeping your right / left heel on the floor and your knee straight, shift  your weight toward the wall, not allowing your back to arch.  You should feel a gentle stretch in the right / left calf. Hold this position for 10 seconds. Repeat 3 times. Complete this stretch 2 times per day.  STRETCH  Soleus, Standing  Place hands on wall.  Extend right / left leg, keeping the other knee somewhat bent.  Slightly point your toes inward on your back foot.  Keep your right / left heel on the floor, bend your back knee, and slightly shift your weight over the back leg so that you feel a gentle stretch deep in your back calf.  Hold this position for 10 seconds. Repeat 3 times. Complete this stretch 2 times per day.  STRETCH  Gastrocsoleus, Standing  Note: This exercise can place a lot of stress on your foot and ankle. Please complete this exercise only if specifically instructed by your caregiver.   Place the ball of your right / left foot on a step, keeping your other foot firmly on the same step.  Hold on to the wall or a rail for balance.  Slowly lift your other foot, allowing your body weight to press your heel down over the edge of the step.  You should feel a stretch in your right / left calf.  Hold this position   for 10 seconds.  Repeat this exercise with a slight bend in your right / left knee. Repeat 3 times. Complete this stretch 2 times per day.   STRENGTHENING EXERCISES - Plantar Fasciitis (Heel Spur Syndrome)  These exercises may help you when beginning to rehabilitate your injury. They may resolve your symptoms with or without further involvement from your physician, physical therapist or athletic trainer. While completing these exercises, remember:   Muscles can gain both the endurance and the strength needed for everyday activities through controlled exercises.  Complete these exercises as instructed by your physician, physical therapist or athletic trainer. Progress the resistance and repetitions only as guided.  STRENGTH - Towel Curls  Sit in  a chair positioned on a non-carpeted surface.  Place your foot on a towel, keeping your heel on the floor.  Pull the towel toward your heel by only curling your toes. Keep your heel on the floor. Repeat 3 times. Complete this exercise 2 times per day.  STRENGTH - Ankle Inversion  Secure one end of a rubber exercise band/tubing to a fixed object (table, pole). Loop the other end around your foot just before your toes.  Place your fists between your knees. This will focus your strengthening at your ankle.  Slowly, pull your big toe up and in, making sure the band/tubing is positioned to resist the entire motion.  Hold this position for 10 seconds.  Have your muscles resist the band/tubing as it slowly pulls your foot back to the starting position. Repeat 3 times. Complete this exercises 2 times per day.  Document Released: 06/27/2005 Document Revised: 09/19/2011 Document Reviewed: 10/09/2008 [employer] Patient Information 2014 [employer].  

## 2019-05-17 ENCOUNTER — Other Ambulatory Visit: Payer: Self-pay | Admitting: Podiatry

## 2019-05-17 DIAGNOSIS — M722 Plantar fascial fibromatosis: Secondary | ICD-10-CM

## 2019-05-27 NOTE — Progress Notes (Signed)
Subjective:   Patient ID: Danielle [name], female   DOB: 63 y.o.   MRN: [id number]   HPI 63 year old female presents the office today for concerns about her foot pain.  She said that she was diagnosed with plantar fasciitis and April 2020 on the right side in June 2020 and the left side.  She states that she gets pain in the morning after is been on her feet for some time.  She denies any recent injury.    She has had steroid injections previously which were helpful.  No radiating pain or weakness.  No other concerns.   Review of Systems  All other systems reviewed and are negative.  Past Medical History:  Diagnosis Date  . Acute sinusitis, unspecified 10/24/2008  . ALLERGIC RHINITIS 10/24/2008  . BRONCHITIS, ACUTE 06/13/2008  . Cough 06/13/2008  . DYSPNEA 05/07/2010  . FOOT PAIN 04/02/2010  . GERD 02/19/2007  . 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 HYPERTENSION 02/15/2008  . INSOMNIA, PERSISTENT 02/15/2008  . INTRAABDOMINAL ABSCESS 02/19/2007  . MOOD DISORDER 02/20/2009  . OBESITY 02/20/2009  . TOBACCO USE, QUIT 09/10/2009    Past Surgical History:  Procedure Laterality Date  . ABDOMINAL HYSTERECTOMY    . CHOLECYSTECTOMY    . ERCP       Current Outpatient Medications:  .  bumetanide (BUMEX) 0.5 MG tablet, Take 1 tablet by mouth once daily, Disp: 30 tablet, Rfl: 11 .  Cholecalciferol (VITAMIN D3) 1.25 MG (50000 UT) CAPS, Take 1 capsule by mouth every 30 (thirty) days., Disp: 3 capsule, Rfl: 4 .  cyanocobalamin (,VITAMIN B-12,) 1000 MCG/ML injection, Inject 1 mL (1,000 mcg total) into the skin every 30 (thirty) days., Disp: 10 mL, Rfl: 6 .  Diclofenac Sodium CR 100 MG 24 hr tablet, Take 1 tablet (100 mg total) by mouth daily. Take with food, Disp: 30 tablet, Rfl: 2 .  famotidine (PEPCID) 40 MG tablet, TAKE 1 TABLET BY MOUTH ONCE DAILY AT BEDTIME, Disp: 90 tablet, Rfl: 3 .  fluocinolone (VANOS) 0.01 % cream, Use once daily on scalp rash , Disp: , Rfl:  .  losartan (COZAAR)  100 MG tablet, Take 1 tablet by mouth once daily, Disp: 90 tablet, Rfl: 3 .  mometasone (ELOCON) 0.1 % lotion, APPLY   TOPICALLY TO AFFECTED AREA TWICE DAILY, Disp: 60 mL, Rfl: 1 .  rosuvastatin (CRESTOR) 5 MG tablet, Take 1 tablet (5 mg total) by mouth daily., Disp: 90 tablet, Rfl: 1 .  SYRINGE-NEEDLE, DISP, 3 ML (BD ECLIPSE SYRINGE) 23G X 1" 3 ML MISC, Use sq as directed, Disp: 50 each, Rfl: 2 .  triamcinolone cream (KENALOG) 0.1 %, Apply 1 application topically 4 (four) times daily., Disp: 80 g, Rfl: 1 .  methylPREDNISolone (MEDROL DOSEPAK) 4 MG TBPK tablet, Take as directed, Disp: 21 tablet, Rfl: 0 .  phentermine (ADIPEX-P) 37.5 MG tablet, Take 1 tablet (37.5 mg total) by mouth daily before breakfast., Disp: 30 tablet, Rfl: 0  Allergies  Allergen Reactions  . Amlodipine Besylate     REACTION: depressed and teary  . Amoxicillin     Upset stomach  . Benazepril Hcl     REACTION: cough  . Carvedilol     REACTION: fatigue  . Dexilant [Dexlansoprazole]     Dry mouth   . Hydrochlorothiazide   . Olmesartan Medoxomil     REACTION: sinus congestion  . Omeprazole     Sore tongue  . Pepcid [Famotidine]   . Phentermine  Angry, constipation  . Spironolactone     REACTION: abd pain  . Sulfonamide Derivatives   . Verapamil     Stiff, achy         Objective:  Physical Exam  General: AAO x3, NAD  Dermatological: Skin is warm, dry and supple bilateral. Nails x 10 are well manicured; remaining integument appears unremarkable at this time. There are no open sores, no preulcerative lesions, no rash or signs of infection present.  Vascular: Dorsalis Pedis artery and Posterior Tibial artery pedal pulses are 2/4 bilateral with immedate capillary fill time. Pedal hair growth present. No varicosities and no lower extremity edema present bilateral. There is no pain with calf compression, swelling, warmth, erythema.   Neruologic: Grossly intact via light touch bilateral. Vibratory intact via  tuning fork bilateral. Protective threshold with Semmes Wienstein monofilament intact to all pedal sites bilateral.  Negative Tinel sign.  Musculoskeletal: Tenderness to palpation along the plantar medial tubercle of the calcaneus at the insertion of plantar fascia on the left and right foot. There is no pain along the course of the plantar fascia within the arch of the foot. Plantar fascia appears to be intact. There is no pain with lateral compression of the calcaneus or pain with vibratory sensation. There is no pain along the course or insertion of the achilles tendon. No other areas of tenderness to bilateral lower extremities.Muscular strength 5/5 in all groups tested bilateral.  Gait: Unassisted, Nonantalgic.       Assessment:   Bilateral heel pain, plantar fasciitis     Plan:  -Treatment options discussed including all alternatives, risks, and complications -Etiology of symptoms were discussed -X-rays were obtained and reviewed with the patient.  No evidence of acute fracture or stress fracture. -Discussed steroid injections. See procedure note below.  -Medrol dose pack -Stretching, icing daily. -Discussion modifications and orthotics -Night splint  Trula Slade DPM

## 2019-06-10 ENCOUNTER — Ambulatory Visit: Payer: Medicare Other | Admitting: Podiatry

## 2019-06-25 ENCOUNTER — Ambulatory Visit (INDEPENDENT_AMBULATORY_CARE_PROVIDER_SITE_OTHER): Payer: Medicare Other | Admitting: Podiatry

## 2019-06-25 ENCOUNTER — Other Ambulatory Visit: Payer: Self-pay

## 2019-06-25 DIAGNOSIS — M722 Plantar fascial fibromatosis: Secondary | ICD-10-CM | POA: Diagnosis not present

## 2019-06-25 DIAGNOSIS — M79671 Pain in right foot: Secondary | ICD-10-CM

## 2019-06-25 DIAGNOSIS — M25471 Effusion, right ankle: Secondary | ICD-10-CM

## 2019-06-25 MED ORDER — DICLOFENAC SODIUM 1 % EX GEL: 2.0000 g | Freq: Four times a day (QID) | CUTANEOUS | 2 refills | Status: AC

## 2019-06-25 NOTE — Patient Instructions (Signed)

## 2019-06-26 ENCOUNTER — Ambulatory Visit: Payer: Medicare Other | Admitting: Internal Medicine

## 2019-06-26 NOTE — Progress Notes (Signed)
Subjective: 63 year old female presents the office today for follow evaluation of plantar fasciitis, heel pain.  She said the left foot is been doing much better but she states the right foot feels the same.  She did get relief after the injection as well as the medication.  The Was helping at first but not as much now.  She states that she thought it was getting better but now it is back to where it was.  She feels that the pain is moved and she points more to the plantar lateral aspect of the heel.  She also states that she does get some swelling to the ankle but she denies any pain to the ankle. Denies any systemic complaints such as fevers, chills, nausea, vomiting. No acute changes since last appointment, and no other complaints at this time.   Objective: AAO x3, NAD DP/PT pulses palpable bilaterally, CRT less than 3 seconds Majority of tenderness is the plantar lateral aspect of the right calcaneus at the insertion of plantar fascia.  There is no pain with lateral compression of calcaneus.  There is no swelling to the plantar heel.  Minimal swelling on the medial ankle and the flexor tendons but there is no pain to this area.  Flexor, extensor tendons appear to be intact.  Achilles tendon intact.  Negative Tinel sign.  No significant discomfort in the left side.  No open lesions or pre-ulcerative lesions.  No pain with calf compression, swelling, warmth, erythema  Assessment: Right foot plantar fasciitis  Plan: -All treatment options discussed with the patient including all alternatives, risks, complications.  -Steroid injection performed.  See procedure note below. -Fracture walker dispensed. -Also dispensed power steps that she can start to wear as she comes out of the cam boot.  Discussed break-in instructions.  Also discussed shoe modifications.  She is wearing a very flexible shoe in the arch without any support.  But continue with a night splint as well particularly in the left side to  help with reoccurrence. -If no improvement in the right side will order MRI. -Patient encouraged to call the office with any questions, concerns, change in symptoms.   Procedure: Injection Tendon/Ligament Discussed alternatives, risks, complications and verbal consent was obtained.  Location: Right plantar fascia at the glabrous junction; LATERAL approach. Skin Prep: Alcohol. Injectate: 0.5cc 0.5% marcaine plain, 0.5 cc 2% lidocaine plain and, 1 cc kenalog 10. Disposition: Patient tolerated procedure well. Injection site dressed with a band-aid.  Post-injection care was discussed and return precautions discussed.   Return in about 4 weeks (around 07/23/2019).  Trula Slade DPM

## 2019-07-23 ENCOUNTER — Ambulatory Visit (INDEPENDENT_AMBULATORY_CARE_PROVIDER_SITE_OTHER): Payer: Medicare Other | Admitting: Podiatry

## 2019-07-23 ENCOUNTER — Other Ambulatory Visit: Payer: Self-pay

## 2019-07-23 DIAGNOSIS — M722 Plantar fascial fibromatosis: Secondary | ICD-10-CM | POA: Diagnosis not present

## 2019-07-23 NOTE — Patient Instructions (Signed)

## 2019-07-31 NOTE — Progress Notes (Signed)
Subjective: 64 year old female presents the office today for follow-up evaluation of heel pain.  She states that she is making improvement but he still has some discomfort in that pain is 6/10 on the right side.  She states that she wears the boot whenever she is not driving.  She also has been using the the night splint which have been helpful.  Injection was also helpful last time.  She denies any recent injury. Denies any systemic complaints such as fevers, chills, nausea, vomiting. No acute changes since last appointment, and no other complaints at this time.   Objective: AAO x3, NAD DP/PT pulses palpable bilaterally, CRT less than 3 seconds There is decreased but continued tenderness palpation of the plantar lateral tubercle of the calcaneus at the insertion of plantar fashion on the right side.  There is no pain with compression of calcaneus.  No pain with Achilles tendon. No open lesions or pre-ulcerative lesions.  No pain with calf compression, swelling, warmth, erythema  Assessment: Plantar fasciitis with improvement  Plan: -All treatment options discussed with the patient including all alternatives, risks, complications.  -She reports that she has significant progress.  Prescribed continue with stretching, icing daily.  I would like for her to come out of the cam boot and start to wear a regular shoe.  Discussed with mother steroid injection today. -Patient encouraged to call the office with any questions, concerns, change in symptoms.   Trula Slade DPM

## 2019-09-03 ENCOUNTER — Ambulatory Visit: Payer: Medicare Other | Admitting: Podiatry

## 2019-09-27 ENCOUNTER — Other Ambulatory Visit: Payer: Self-pay | Admitting: Internal Medicine

## 2019-10-27 DIAGNOSIS — S86811A Strain of other muscle(s) and tendon(s) at lower leg level, right leg, initial encounter: Secondary | ICD-10-CM | POA: Diagnosis not present

## 2019-10-27 DIAGNOSIS — J309 Allergic rhinitis, unspecified: Secondary | ICD-10-CM | POA: Diagnosis not present

## 2019-10-27 DIAGNOSIS — S2020XA Contusion of thorax, unspecified, initial encounter: Secondary | ICD-10-CM | POA: Diagnosis not present

## 2019-10-27 DIAGNOSIS — R42 Dizziness and giddiness: Secondary | ICD-10-CM | POA: Diagnosis not present

## 2019-11-06 ENCOUNTER — Other Ambulatory Visit: Payer: Self-pay | Admitting: Internal Medicine

## 2019-11-07 ENCOUNTER — Other Ambulatory Visit: Payer: Self-pay

## 2019-11-07 ENCOUNTER — Encounter: Payer: Self-pay | Admitting: Internal Medicine

## 2019-11-07 ENCOUNTER — Ambulatory Visit (INDEPENDENT_AMBULATORY_CARE_PROVIDER_SITE_OTHER): Payer: Medicare Other | Admitting: Internal Medicine

## 2019-11-07 ENCOUNTER — Ambulatory Visit (INDEPENDENT_AMBULATORY_CARE_PROVIDER_SITE_OTHER): Payer: Medicare Other

## 2019-11-07 VITALS — BP 226/122 | HR 76 | Temp 98.4°F | Ht 62.0 in | Wt 166.0 lb

## 2019-11-07 DIAGNOSIS — R0789 Other chest pain: Secondary | ICD-10-CM | POA: Diagnosis not present

## 2019-11-07 DIAGNOSIS — M25561 Pain in right knee: Secondary | ICD-10-CM

## 2019-11-07 DIAGNOSIS — I10 Essential (primary) hypertension: Secondary | ICD-10-CM

## 2019-11-07 DIAGNOSIS — S8991XA Unspecified injury of right lower leg, initial encounter: Secondary | ICD-10-CM | POA: Diagnosis not present

## 2019-11-07 LAB — BASIC METABOLIC PANEL
BUN: 14 mg/dL (ref 6–23)
CO2: 28 mEq/L (ref 19–32)
Calcium: 9.8 mg/dL (ref 8.4–10.5)
Chloride: 103 mEq/L (ref 96–112)
Creatinine, Ser: 0.92 mg/dL (ref 0.40–1.20)
GFR: 61.71 mL/min (ref >60.00)
Glucose, Bld: 100 mg/dL — ABNORMAL HIGH (ref 70–99)
Potassium: 3.9 mEq/L (ref 3.5–5.1)
Sodium: 140 mEq/L (ref 135–145)

## 2019-11-07 MED ORDER — BUMETANIDE 0.5 MG PO TABS: 0.5000 mg | ORAL_TABLET | Freq: Every day | ORAL | 3 refills | Status: AC

## 2019-11-07 MED ORDER — LOSARTAN POTASSIUM 100 MG PO TABS: 100.0000 mg | ORAL_TABLET | Freq: Every day | ORAL | 3 refills | Status: AC

## 2019-11-07 MED ORDER — MELOXICAM 7.5 MG PO TABS: 7.5000 mg | ORAL_TABLET | Freq: Every day | ORAL | 1 refills | Status: AC | PRN

## 2019-11-07 MED ORDER — LORATADINE 10 MG PO TABS: 10.0000 mg | ORAL_TABLET | Freq: Every day | ORAL | 3 refills | Status: AC

## 2019-11-07 NOTE — Patient Instructions (Signed)
Sleeve brace Meloxicam when BP is ok

## 2019-11-07 NOTE — Progress Notes (Signed)
Subjective:  Patient ID: Danielle [name], female    DOB: April 15, 1956  Age: 64 y.o. MRN: [id number]  CC: No chief complaint on file.   HPI [name] presents for a fall 10 d ago, she lost balance carrying her dog - went sideways on the R knee - hurts and broke ribs on the L Pt went to [facility] - Meloxicam helped. No X ray.  Fell 4 wks ago at dtr's - tripped and fell - hit head, no LOC  F/u on BP meds - ran out  Outpatient Medications Prior to Visit  Medication Sig Dispense Refill  . bumetanide (BUMEX) 0.5 MG tablet Take 1 tablet (0.5 mg total) by mouth daily. Annual appt due in August must see provider for future refills 30 tablet 3  . Cholecalciferol (VITAMIN D3) 1.25 MG (50000 UT) CAPS Take 1 capsule by mouth every 30 (thirty) days. 3 capsule 4  . cyanocobalamin (,VITAMIN B-12,) 1000 MCG/ML injection Inject 1 mL (1,000 mcg total) into the skin every 30 (thirty) days. 10 mL 6  . diclofenac Sodium (VOLTAREN) 1 % GEL Apply 2 g topically 4 (four) times daily. Rub into affected area of foot 2 to 4 times daily 100 g 2  . Diclofenac Sodium CR 100 MG 24 hr tablet Take 1 tablet (100 mg total) by mouth daily. Take with food 30 tablet 2  . famotidine (PEPCID) 40 MG tablet TAKE 1 TABLET BY MOUTH ONCE DAILY AT BEDTIME 90 tablet 3  . fluocinolone (VANOS) 0.01 % cream Use once daily on scalp rash     . losartan (COZAAR) 100 MG tablet Take 1 tablet by mouth once daily 90 tablet 3  . meloxicam (MOBIC) 7.5 MG tablet     . mometasone (ELOCON) 0.1 % lotion APPLY   TOPICALLY TO AFFECTED AREA TWICE DAILY 60 mL 1  . rosuvastatin (CRESTOR) 5 MG tablet Take 1 tablet (5 mg total) by mouth daily. 90 tablet 1  . SYRINGE-NEEDLE, DISP, 3 ML (BD ECLIPSE SYRINGE) 23G X 1" 3 ML MISC Use sq as directed 50 each 2  . traMADol (ULTRAM) 50 MG tablet TAKE 1 TABLET BY MOUTH EVERY 8 HOURS AS NEEDED FOR SEVERE PAIN 60 tablet 1  . triamcinolone cream (KENALOG) 0.1 % Apply 1 application topically 4 (four) times daily. 80 g 1  .  phentermine (ADIPEX-P) 37.5 MG tablet Take 1 tablet (37.5 mg total) by mouth daily before breakfast. 30 tablet 0  . methylPREDNISolone (MEDROL DOSEPAK) 4 MG TBPK tablet Take as directed 21 tablet 0   No facility-administered medications prior to visit.    ROS: Review of Systems  Constitutional: Negative for activity change, appetite change, chills, fatigue and unexpected weight change.  HENT: Negative for congestion, mouth sores and sinus pressure.   Eyes: Negative for visual disturbance.  Respiratory: Negative for cough and chest tightness.   Cardiovascular: Positive for chest pain.  Gastrointestinal: Negative for abdominal pain and nausea.  Genitourinary: Negative for difficulty urinating, frequency and vaginal pain.  Musculoskeletal: Positive for arthralgias and gait problem. Negative for back pain.  Skin: Negative for pallor and rash.  Neurological: Negative for dizziness, tremors, weakness, numbness and headaches.  Psychiatric/Behavioral: Negative for confusion and sleep disturbance.    Objective:  BP (!) 226/122 (BP Location: Left Arm, Patient Position: Sitting, Cuff Size: Normal)   Pulse 76   Temp 98.4 F (36.9 C) (Oral)   Ht 5\' 2"  (1.575 m)   Wt 166 lb (75.3 kg)   SpO2  96%   BMI 30.36 kg/m   BP Readings from Last 3 Encounters:  11/07/19 (!) 226/122  03/26/19 (!) 160/86  02/12/19 (!) 170/90    Wt Readings from Last 3 Encounters:  11/07/19 166 lb (75.3 kg)  03/26/19 163 lb (73.9 kg)  02/12/19 164 lb (74.4 kg)    Physical Exam Constitutional:      General: She is not in acute distress.    Appearance: She is well-developed.  HENT:     Head: Normocephalic.     Right Ear: External ear normal.     Left Ear: External ear normal.     Nose: Nose normal.  Eyes:     General:        Right eye: No discharge.        Left eye: No discharge.     Conjunctiva/sclera: Conjunctivae normal.     Pupils: Pupils are equal, round, and reactive to light.  Neck:     Thyroid:  No thyromegaly.     Vascular: No JVD.     Trachea: No tracheal deviation.  Cardiovascular:     Rate and Rhythm: Normal rate and regular rhythm.     Heart sounds: Normal heart sounds.  Pulmonary:     Effort: No respiratory distress.     Breath sounds: No stridor. No wheezing.  Chest:     Chest wall: Tenderness present.  Abdominal:     General: Bowel sounds are normal. There is no distension.     Palpations: Abdomen is soft. There is no mass.     Tenderness: There is no abdominal tenderness. There is no guarding or rebound.  Musculoskeletal:        General: Tenderness present.     Cervical back: Normal range of motion and neck supple.  Lymphadenopathy:     Cervical: No cervical adenopathy.  Skin:    Findings: No erythema or rash.  Neurological:     Cranial Nerves: No cranial nerve deficit.     Motor: No abnormal muscle tone.     Coordination: Coordination normal.     Deep Tendon Reflexes: Reflexes normal.  Psychiatric:        Behavior: Behavior normal.        Thought Content: Thought content normal.        Judgment: Judgment normal.    R knee hurts laterally L ribs - sensitive   Lab Results  Component Value Date   WBC 9.2 02/15/2018   HGB 15.4 (H) 02/15/2018   HCT 44.5 02/15/2018   PLT 354.0 02/15/2018   GLUCOSE 96 02/12/2019   CHOL 313 (H) 02/15/2018   TRIG 324.0 (H) 02/15/2018   HDL 57.80 02/15/2018   LDLDIRECT 231.0 02/15/2018   LDLCALC 175 (H) 01/25/2017   ALT 17 02/15/2018   AST 14 02/15/2018   NA 139 02/12/2019   K 3.8 02/12/2019   CL 103 02/12/2019   CREATININE 0.82 02/12/2019   BUN 18 02/12/2019   CO2 25 02/12/2019   TSH 1.36 02/12/2019   HGBA1C 5.6 12/12/2016    DG Lumbar Spine 2-3 Views  Result Date: 02/12/2019 CLINICAL DATA:  Low back pain, worsening bilateral sciatica for 4 months, no known injury EXAM: LUMBAR SPINE - 2-3 VIEW COMPARISON:  None. FINDINGS: There is no evidence of lumbar spine fracture. Preservation of the normal lumbar lordosis.  No spondylolisthesis. Multilevel intervertebral disc height loss and more pronounced facet degenerative changes are seen in the lumbar spine, maximally at L4-5 and L5-S1. Cholecystectomy clips are seen  in the right upper quadrant. Coarse calcification projects over the lower pole left kidney. IMPRESSION: 1. 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 2. Left nephrolithiasis. Electronically Signed   By: Lovena Le M.D.   On: 02/12/2019 20:48    Assessment & Plan:   There are no diagnoses linked to this encounter.   No orders of the defined types were placed in this encounter.    Follow-up: No follow-ups on file.  Walker Kehr, MD

## 2019-11-07 NOTE — Assessment & Plan Note (Signed)
X ray Sleeve brace Meloxicam when BP is ok

## 2019-11-11 NOTE — Assessment & Plan Note (Signed)
Contusion Rib X ray  - pt declined

## 2019-11-14 ENCOUNTER — Telehealth: Payer: Self-pay | Admitting: Internal Medicine

## 2019-11-14 NOTE — Telephone Encounter (Signed)
   Patient states for 1 month she has had episodes of chills, cold sweats, nausea, and pain in kidney area. Patient wants to know if she had a kidney stone that might be trying to pass, based on last ov conversation. Declined appointment. Seeking advice only

## 2019-11-15 NOTE — Telephone Encounter (Signed)
    Patient states she is feeling a little better today, but still is concerned about fatigue and episodes of pain Appointment scheduled for 5/10

## 2019-11-18 ENCOUNTER — Encounter: Payer: Self-pay | Admitting: Internal Medicine

## 2019-11-18 ENCOUNTER — Other Ambulatory Visit (INDEPENDENT_AMBULATORY_CARE_PROVIDER_SITE_OTHER): Payer: Medicare HMO

## 2019-11-18 ENCOUNTER — Other Ambulatory Visit: Payer: Self-pay

## 2019-11-18 ENCOUNTER — Ambulatory Visit (INDEPENDENT_AMBULATORY_CARE_PROVIDER_SITE_OTHER): Payer: Medicare HMO | Admitting: Internal Medicine

## 2019-11-18 DIAGNOSIS — R109 Unspecified abdominal pain: Secondary | ICD-10-CM | POA: Insufficient documentation

## 2019-11-18 DIAGNOSIS — R300 Dysuria: Secondary | ICD-10-CM | POA: Diagnosis not present

## 2019-11-18 DIAGNOSIS — R6883 Chills (without fever): Secondary | ICD-10-CM

## 2019-11-18 DIAGNOSIS — R3989 Other symptoms and signs involving the genitourinary system: Secondary | ICD-10-CM | POA: Diagnosis not present

## 2019-11-18 LAB — CBC WITH DIFFERENTIAL/PLATELET
Basophils Absolute: 0.1 10*3/uL (ref 0.0–0.1)
Basophils Relative: 1.2 % (ref 0.0–3.0)
Eosinophils Absolute: 0.3 10*3/uL (ref 0.0–0.7)
Eosinophils Relative: 3.5 % (ref 0.0–5.0)
HCT: 37.4 % (ref 36.0–46.0)
Hemoglobin: 12.9 g/dL (ref 12.0–15.0)
Lymphocytes Relative: 29.8 % (ref 12.0–46.0)
Lymphs Abs: 2.8 10*3/uL (ref 0.7–4.0)
MCHC: 34.6 g/dL (ref 30.0–36.0)
MCV: 89.7 fl (ref 78.0–100.0)
Monocytes Absolute: 0.7 10*3/uL (ref 0.1–1.0)
Monocytes Relative: 7.3 % (ref 3.0–12.0)
Neutro Abs: 5.5 10*3/uL (ref 1.4–7.7)
Neutrophils Relative %: 58.2 % (ref 43.0–77.0)
Platelets: 445 10*3/uL — ABNORMAL HIGH (ref 150.0–400.0)
RBC: 4.17 Mil/uL (ref 3.87–5.11)
RDW: 13.7 % (ref 11.5–15.5)
WBC: 9.5 10*3/uL (ref 4.0–10.5)

## 2019-11-18 NOTE — Progress Notes (Signed)
Subjective:  Patient ID: Danielle [name], female    DOB: 22-Feb-1956  Age: 64 y.o. MRN: [id number]  CC: No chief complaint on file.   HPI Danielle [name] presents for not feeling well since Jan 2021 C/o LBP C/o shaking chills and L flank pain once a week x 1 month: last attack on Thur last week H/o kidney stones  C/o nausea   Outpatient Medications Prior to Visit  Medication Sig Dispense Refill  . bumetanide (BUMEX) 0.5 MG tablet Take 1 tablet (0.5 mg total) by mouth daily. Annual appt due in August must see provider for future refills 90 tablet 3  . Cholecalciferol (VITAMIN D3) 1.25 MG (50000 UT) CAPS Take 1 capsule by mouth every 30 (thirty) days. 3 capsule 4  . cyanocobalamin (,VITAMIN B-12,) 1000 MCG/ML injection Inject 1 mL (1,000 mcg total) into the skin every 30 (thirty) days. 10 mL 6  . diclofenac Sodium (VOLTAREN) 1 % GEL Apply 2 g topically 4 (four) times daily. Rub into affected area of foot 2 to 4 times daily 100 g 2  . Diclofenac Sodium CR 100 MG 24 hr tablet Take 1 tablet (100 mg total) by mouth daily. Take with food 30 tablet 2  . famotidine (PEPCID) 40 MG tablet TAKE 1 TABLET BY MOUTH ONCE DAILY AT BEDTIME 90 tablet 3  . fluocinolone (VANOS) 0.01 % cream Use once daily on scalp rash     . loratadine (CLARITIN) 10 MG tablet Take 1 tablet (10 mg total) by mouth daily. 100 tablet 3  . losartan (COZAAR) 100 MG tablet Take 1 tablet (100 mg total) by mouth daily. 90 tablet 3  . meloxicam (MOBIC) 7.5 MG tablet Take 1 tablet (7.5 mg total) by mouth daily as needed for pain. 30 tablet 1  . mometasone (ELOCON) 0.1 % lotion APPLY   TOPICALLY TO AFFECTED AREA TWICE DAILY 60 mL 1  . rosuvastatin (CRESTOR) 5 MG tablet Take 1 tablet (5 mg total) by mouth daily. 90 tablet 1  . SYRINGE-NEEDLE, DISP, 3 ML (BD ECLIPSE SYRINGE) 23G X 1" 3 ML MISC Use sq as directed 50 each 2  . traMADol (ULTRAM) 50 MG tablet TAKE 1 TABLET BY MOUTH EVERY 8 HOURS AS NEEDED FOR SEVERE PAIN 60 tablet 1  .  triamcinolone cream (KENALOG) 0.1 % Apply 1 application topically 4 (four) times daily. 80 g 1  . phentermine (ADIPEX-P) 37.5 MG tablet Take 1 tablet (37.5 mg total) by mouth daily before breakfast. 30 tablet 0   No facility-administered medications prior to visit.    ROS: Review of Systems  Constitutional: Positive for chills and fatigue. Negative for activity change, appetite change and unexpected weight change.  HENT: Negative for congestion, mouth sores and sinus pressure.   Eyes: Negative for visual disturbance.  Respiratory: Negative for cough and chest tightness.   Gastrointestinal: Negative for abdominal pain and nausea.  Genitourinary: Negative for difficulty urinating, frequency and vaginal pain.  Musculoskeletal: Positive for arthralgias and back pain. Negative for gait problem.  Skin: Negative for pallor and rash.  Neurological: Negative for dizziness, tremors, weakness, numbness and headaches.  Psychiatric/Behavioral: Negative for confusion, sleep disturbance and suicidal ideas.    Objective:  BP (!) 176/98 (BP Location: Left Arm, Patient Position: Sitting, Cuff Size: Normal)   Pulse 90   Temp 98 F (36.7 C) (Oral)   Ht 5\' 2"  (1.575 m)   Wt 165 lb (74.8 kg)   SpO2 95%   BMI 30.18 kg/m  BP Readings from Last 3 Encounters:  11/18/19 (!) 176/98  11/07/19 (!) 226/122  03/26/19 (!) 160/86    Wt Readings from Last 3 Encounters:  11/18/19 165 lb (74.8 kg)  11/07/19 166 lb (75.3 kg)  03/26/19 163 lb (73.9 kg)    Physical Exam Constitutional:      General: She is not in acute distress.    Appearance: She is well-developed. She is obese.  HENT:     Head: Normocephalic.     Right Ear: External ear normal.     Left Ear: External ear normal.     Nose: Nose normal.  Eyes:     General:        Right eye: No discharge.        Left eye: No discharge.     Conjunctiva/sclera: Conjunctivae normal.     Pupils: Pupils are equal, round, and reactive to light.  Neck:      Thyroid: No thyromegaly.     Vascular: No JVD.     Trachea: No tracheal deviation.  Cardiovascular:     Rate and Rhythm: Normal rate and regular rhythm.     Heart sounds: Normal heart sounds.  Pulmonary:     Effort: No respiratory distress.     Breath sounds: No stridor. No wheezing.  Abdominal:     General: Bowel sounds are normal. There is no distension.     Palpations: Abdomen is soft. There is no mass.     Tenderness: There is no abdominal tenderness. There is no guarding or rebound.  Musculoskeletal:        General: No tenderness.     Cervical back: Normal range of motion and neck supple.  Lymphadenopathy:     Cervical: No cervical adenopathy.  Skin:    Findings: No erythema or rash.  Neurological:     Cranial Nerves: No cranial nerve deficit.     Motor: No abnormal muscle tone.     Coordination: Coordination normal.     Deep Tendon Reflexes: Reflexes normal.  Psychiatric:        Behavior: Behavior normal.        Thought Content: Thought content normal.        Judgment: Judgment normal.     Lab Results  Component Value Date   WBC 9.2 02/15/2018   HGB 15.4 (H) 02/15/2018   HCT 44.5 02/15/2018   PLT 354.0 02/15/2018   GLUCOSE 100 (H) 11/07/2019   CHOL 313 (H) 02/15/2018   TRIG 324.0 (H) 02/15/2018   HDL 57.80 02/15/2018   LDLDIRECT 231.0 02/15/2018   LDLCALC 175 (H) 01/25/2017   ALT 17 02/15/2018   AST 14 02/15/2018   NA 140 11/07/2019   K 3.9 11/07/2019   CL 103 11/07/2019   CREATININE 0.92 11/07/2019   BUN 14 11/07/2019   CO2 28 11/07/2019   TSH 1.36 02/12/2019   HGBA1C 5.6 12/12/2016    DG Lumbar Spine 2-3 Views  Result Date: 02/12/2019 CLINICAL DATA:  Low back pain, worsening bilateral sciatica for 4 months, no known injury EXAM: LUMBAR SPINE - 2-3 VIEW COMPARISON:  None. FINDINGS: There is no evidence of lumbar spine fracture. Preservation of the normal lumbar lordosis. No spondylolisthesis. Multilevel intervertebral disc height loss and more  pronounced facet degenerative changes are seen in the lumbar spine, maximally at L4-5 and L5-S1. Cholecystectomy clips are seen in the right upper quadrant. Coarse calcification projects over the lower pole left kidney. IMPRESSION: 1. Multilevel degenerative disc disease throughout the lumbar  spine, worst at L4-5 and L5-S1. 2. Left nephrolithiasis. Electronically Signed   By: Lovena Le M.D.   On: 02/12/2019 20:48    Assessment & Plan:   There are no diagnoses linked to this encounter.   No orders of the defined types were placed in this encounter.    Follow-up: No follow-ups on file.  Walker Kehr, MD

## 2019-11-18 NOTE — Assessment & Plan Note (Signed)
Labs Renal CT

## 2019-11-18 NOTE — Assessment & Plan Note (Signed)
?  fistula vs stone Labs CT GYN appt Urol f/u

## 2019-11-18 NOTE — Assessment & Plan Note (Signed)
Check temp Labs

## 2019-11-19 LAB — URINALYSIS, ROUTINE W REFLEX MICROSCOPIC
Bilirubin Urine: NEGATIVE
Ketones, ur: NEGATIVE
Nitrite: POSITIVE — AB
Specific Gravity, Urine: 1.02 (ref 1.000–1.030)
Urine Glucose: NEGATIVE
Urobilinogen, UA: 0.2 (ref 0.0–1.0)
pH: 6.5 (ref 5.0–8.0)

## 2019-11-19 LAB — HEPATIC FUNCTION PANEL
ALT: 15 U/L (ref 0–35)
AST: 16 U/L (ref 0–37)
Albumin: 4.4 g/dL (ref 3.5–5.2)
Alkaline Phosphatase: 117 U/L (ref 39–117)
Bilirubin, Direct: 0 mg/dL (ref 0.0–0.3)
Total Bilirubin: 0.2 mg/dL (ref 0.2–1.2)
Total Protein: 8 g/dL (ref 6.0–8.3)

## 2019-11-19 LAB — BASIC METABOLIC PANEL
BUN: 20 mg/dL (ref 6–23)
CO2: 27 mEq/L (ref 19–32)
Calcium: 10.3 mg/dL (ref 8.4–10.5)
Chloride: 101 mEq/L (ref 96–112)
Creatinine, Ser: 1.14 mg/dL (ref 0.40–1.20)
GFR: 48.18 mL/min — ABNORMAL LOW (ref >60.00)
Glucose, Bld: 108 mg/dL — ABNORMAL HIGH (ref 70–99)
Potassium: 3.7 mEq/L (ref 3.5–5.1)
Sodium: 140 mEq/L (ref 135–145)

## 2019-11-19 LAB — TSH: TSH: 1.19 u[IU]/mL (ref 0.35–4.50)

## 2019-11-20 LAB — CULTURE, URINE COMPREHENSIVE
MICRO NUMBER:: 10458513
SPECIMEN QUALITY:: ADEQUATE

## 2019-11-20 NOTE — Addendum Note (Signed)
Addended by: Karren Cobble on: 11/20/2019 01:29 PM   Modules accepted: Orders

## 2019-11-21 ENCOUNTER — Encounter: Payer: Self-pay | Admitting: Obstetrics and Gynecology

## 2019-11-21 ENCOUNTER — Other Ambulatory Visit: Payer: Self-pay

## 2019-11-21 ENCOUNTER — Other Ambulatory Visit: Payer: Self-pay | Admitting: Internal Medicine

## 2019-11-21 ENCOUNTER — Ambulatory Visit
Admission: RE | Admit: 2019-11-21 | Discharge: 2019-11-21 | Disposition: A | Discharge status: Home / Self Care | Payer: Medicare HMO | Source: Ambulatory Visit | Attending: Internal Medicine | Admitting: Internal Medicine

## 2019-11-21 DIAGNOSIS — N132 Hydronephrosis with renal and ureteral calculous obstruction: Secondary | ICD-10-CM | POA: Diagnosis not present

## 2019-11-21 DIAGNOSIS — R6883 Chills (without fever): Secondary | ICD-10-CM

## 2019-11-21 DIAGNOSIS — R109 Unspecified abdominal pain: Secondary | ICD-10-CM

## 2019-11-21 DIAGNOSIS — R300 Dysuria: Secondary | ICD-10-CM

## 2019-11-21 MED ORDER — CEPHALEXIN 500 MG PO CAPS
500.0000 mg | ORAL_CAPSULE | Freq: Four times a day (QID) | ORAL | 0 refills | Status: DC
Start: 2019-11-21 — End: 2019-12-19

## 2019-11-22 DIAGNOSIS — K5730 Diverticulosis of large intestine without perforation or abscess without bleeding: Secondary | ICD-10-CM | POA: Diagnosis not present

## 2019-11-22 DIAGNOSIS — M542 Cervicalgia: Secondary | ICD-10-CM | POA: Diagnosis not present

## 2019-11-22 DIAGNOSIS — Z466 Encounter for fitting and adjustment of urinary device: Secondary | ICD-10-CM | POA: Diagnosis not present

## 2019-11-22 DIAGNOSIS — R519 Headache, unspecified: Secondary | ICD-10-CM | POA: Diagnosis not present

## 2019-11-22 DIAGNOSIS — R6883 Chills (without fever): Secondary | ICD-10-CM | POA: Diagnosis not present

## 2019-11-22 DIAGNOSIS — R197 Diarrhea, unspecified: Secondary | ICD-10-CM | POA: Diagnosis not present

## 2019-11-22 DIAGNOSIS — Z20822 Contact with and (suspected) exposure to covid-19: Secondary | ICD-10-CM | POA: Diagnosis not present

## 2019-11-22 DIAGNOSIS — R9431 Abnormal electrocardiogram [ECG] [EKG]: Secondary | ICD-10-CM | POA: Diagnosis not present

## 2019-11-22 DIAGNOSIS — R202 Paresthesia of skin: Secondary | ICD-10-CM | POA: Diagnosis not present

## 2019-11-22 DIAGNOSIS — K439 Ventral hernia without obstruction or gangrene: Secondary | ICD-10-CM | POA: Diagnosis not present

## 2019-11-22 DIAGNOSIS — N132 Hydronephrosis with renal and ureteral calculous obstruction: Secondary | ICD-10-CM | POA: Diagnosis not present

## 2019-11-22 DIAGNOSIS — Z792 Long term (current) use of antibiotics: Secondary | ICD-10-CM | POA: Diagnosis not present

## 2019-11-22 DIAGNOSIS — I10 Essential (primary) hypertension: Secondary | ICD-10-CM | POA: Diagnosis not present

## 2019-11-22 DIAGNOSIS — M549 Dorsalgia, unspecified: Secondary | ICD-10-CM | POA: Diagnosis not present

## 2019-11-22 DIAGNOSIS — T465X5A Adverse effect of other antihypertensive drugs, initial encounter: Secondary | ICD-10-CM | POA: Diagnosis not present

## 2019-11-22 DIAGNOSIS — N136 Pyonephrosis: Secondary | ICD-10-CM | POA: Diagnosis not present

## 2019-11-22 DIAGNOSIS — E876 Hypokalemia: Secondary | ICD-10-CM | POA: Diagnosis not present

## 2019-11-22 DIAGNOSIS — R110 Nausea: Secondary | ICD-10-CM | POA: Diagnosis not present

## 2019-11-22 DIAGNOSIS — N200 Calculus of kidney: Secondary | ICD-10-CM | POA: Diagnosis not present

## 2019-11-22 DIAGNOSIS — Z9049 Acquired absence of other specified parts of digestive tract: Secondary | ICD-10-CM | POA: Diagnosis not present

## 2019-11-22 DIAGNOSIS — N12 Tubulo-interstitial nephritis, not specified as acute or chronic: Secondary | ICD-10-CM | POA: Diagnosis not present

## 2019-11-22 DIAGNOSIS — N10 Acute tubulo-interstitial nephritis: Secondary | ICD-10-CM | POA: Diagnosis not present

## 2019-11-22 DIAGNOSIS — M25519 Pain in unspecified shoulder: Secondary | ICD-10-CM | POA: Diagnosis not present

## 2019-11-23 DIAGNOSIS — Z466 Encounter for fitting and adjustment of urinary device: Secondary | ICD-10-CM | POA: Diagnosis not present

## 2019-11-23 DIAGNOSIS — N200 Calculus of kidney: Secondary | ICD-10-CM | POA: Diagnosis not present

## 2019-11-24 MED ORDER — FAMOTIDINE 20 MG PO TABS
40.00 | ORAL_TABLET | ORAL | Status: DC
Start: ? — End: 2019-11-24

## 2019-11-24 MED ORDER — MORPHINE SULFATE 4 MG/ML IJ SOLN
1.00 | INTRAMUSCULAR | Status: DC
Start: ? — End: 2019-11-24

## 2019-11-24 MED ORDER — BUMETANIDE 0.5 MG PO TABS
0.50 | ORAL_TABLET | ORAL | Status: DC
Start: 2019-11-25 — End: 2019-11-24

## 2019-11-24 MED ORDER — SENNOSIDES 8.6 MG PO TABS
2.00 | ORAL_TABLET | ORAL | Status: DC
Start: 2019-11-25 — End: 2019-11-24

## 2019-11-24 MED ORDER — LACTULOSE 10 GM/15ML PO SOLN
20.00 | ORAL | Status: DC
Start: ? — End: 2019-11-24

## 2019-11-24 MED ORDER — DSS 100 MG PO CAPS
100.00 | ORAL_CAPSULE | ORAL | Status: DC
Start: 2019-11-25 — End: 2019-11-24

## 2019-11-24 MED ORDER — ACETAMINOPHEN 325 MG PO TABS
650.00 | ORAL_TABLET | ORAL | Status: DC
Start: ? — End: 2019-11-24

## 2019-11-24 MED ORDER — OXYCODONE-ACETAMINOPHEN 5-325 MG PO TABS
1.00 | ORAL_TABLET | ORAL | Status: DC
Start: ? — End: 2019-11-24

## 2019-11-24 MED ORDER — GENERIC EXTERNAL MEDICATION
500.00 | Status: DC
Start: 2019-11-25 — End: 2019-11-24

## 2019-11-24 MED ORDER — LACTATED RINGERS IV SOLN
125.00 | INTRAVENOUS | Status: DC
Start: ? — End: 2019-11-24

## 2019-11-24 MED ORDER — LOSARTAN POTASSIUM 100 MG PO TABS
100.00 | ORAL_TABLET | ORAL | Status: DC
Start: 2019-11-25 — End: 2019-11-24

## 2019-11-24 MED ORDER — ONDANSETRON HCL 4 MG/2ML IJ SOLN
4.00 | INTRAMUSCULAR | Status: DC
Start: ? — End: 2019-11-24

## 2019-11-29 DIAGNOSIS — Z0100 Encounter for examination of eyes and vision without abnormal findings: Secondary | ICD-10-CM | POA: Diagnosis not present

## 2019-11-29 DIAGNOSIS — H5203 Hypermetropia, bilateral: Secondary | ICD-10-CM | POA: Diagnosis not present

## 2019-12-02 ENCOUNTER — Ambulatory Visit: Payer: Medicare HMO | Admitting: Internal Medicine

## 2019-12-03 DIAGNOSIS — N132 Hydronephrosis with renal and ureteral calculous obstruction: Secondary | ICD-10-CM | POA: Diagnosis not present

## 2019-12-03 DIAGNOSIS — Z792 Long term (current) use of antibiotics: Secondary | ICD-10-CM | POA: Diagnosis not present

## 2019-12-03 DIAGNOSIS — N390 Urinary tract infection, site not specified: Secondary | ICD-10-CM | POA: Diagnosis not present

## 2019-12-04 ENCOUNTER — Inpatient Hospital Stay: Payer: Medicare HMO | Admitting: Internal Medicine

## 2019-12-04 DIAGNOSIS — T8144XA Sepsis following a procedure, initial encounter: Secondary | ICD-10-CM | POA: Diagnosis not present

## 2019-12-04 DIAGNOSIS — N132 Hydronephrosis with renal and ureteral calculous obstruction: Secondary | ICD-10-CM | POA: Diagnosis not present

## 2019-12-04 DIAGNOSIS — T83123A Displacement of other urinary stents, initial encounter: Secondary | ICD-10-CM | POA: Diagnosis not present

## 2019-12-04 DIAGNOSIS — L409 Psoriasis, unspecified: Secondary | ICD-10-CM | POA: Diagnosis not present

## 2019-12-04 DIAGNOSIS — A419 Sepsis, unspecified organism: Secondary | ICD-10-CM | POA: Diagnosis not present

## 2019-12-04 DIAGNOSIS — J9811 Atelectasis: Secondary | ICD-10-CM | POA: Diagnosis not present

## 2019-12-04 DIAGNOSIS — R0602 Shortness of breath: Secondary | ICD-10-CM | POA: Diagnosis not present

## 2019-12-04 DIAGNOSIS — Z20822 Contact with and (suspected) exposure to covid-19: Secondary | ICD-10-CM | POA: Diagnosis not present

## 2019-12-04 DIAGNOSIS — E669 Obesity, unspecified: Secondary | ICD-10-CM | POA: Diagnosis not present

## 2019-12-04 DIAGNOSIS — N10 Acute tubulo-interstitial nephritis: Secondary | ICD-10-CM | POA: Diagnosis not present

## 2019-12-04 DIAGNOSIS — R7402 Elevation of levels of lactic acid dehydrogenase (LDH): Secondary | ICD-10-CM | POA: Diagnosis not present

## 2019-12-04 DIAGNOSIS — J90 Pleural effusion, not elsewhere classified: Secondary | ICD-10-CM | POA: Diagnosis not present

## 2019-12-04 DIAGNOSIS — N136 Pyonephrosis: Secondary | ICD-10-CM | POA: Diagnosis not present

## 2019-12-04 DIAGNOSIS — Z960 Presence of urogenital implants: Secondary | ICD-10-CM | POA: Diagnosis not present

## 2019-12-04 DIAGNOSIS — I10 Essential (primary) hypertension: Secondary | ICD-10-CM | POA: Diagnosis not present

## 2019-12-04 DIAGNOSIS — N390 Urinary tract infection, site not specified: Secondary | ICD-10-CM | POA: Diagnosis not present

## 2019-12-04 DIAGNOSIS — N200 Calculus of kidney: Secondary | ICD-10-CM | POA: Diagnosis not present

## 2019-12-04 DIAGNOSIS — E872 Acidosis: Secondary | ICD-10-CM | POA: Diagnosis not present

## 2019-12-05 ENCOUNTER — Inpatient Hospital Stay: Payer: Medicare HMO | Admitting: Internal Medicine

## 2019-12-05 DIAGNOSIS — N200 Calculus of kidney: Secondary | ICD-10-CM | POA: Diagnosis not present

## 2019-12-06 DIAGNOSIS — N132 Hydronephrosis with renal and ureteral calculous obstruction: Secondary | ICD-10-CM | POA: Diagnosis not present

## 2019-12-06 DIAGNOSIS — Z960 Presence of urogenital implants: Secondary | ICD-10-CM | POA: Diagnosis not present

## 2019-12-06 DIAGNOSIS — A419 Sepsis, unspecified organism: Secondary | ICD-10-CM | POA: Diagnosis not present

## 2019-12-06 DIAGNOSIS — R0602 Shortness of breath: Secondary | ICD-10-CM | POA: Diagnosis not present

## 2019-12-06 DIAGNOSIS — N200 Calculus of kidney: Secondary | ICD-10-CM | POA: Diagnosis not present

## 2019-12-06 DIAGNOSIS — N10 Acute tubulo-interstitial nephritis: Secondary | ICD-10-CM | POA: Diagnosis not present

## 2019-12-10 ENCOUNTER — Telehealth: Payer: Self-pay

## 2019-12-10 DIAGNOSIS — Z882 Allergy status to sulfonamides status: Secondary | ICD-10-CM | POA: Diagnosis not present

## 2019-12-10 DIAGNOSIS — Z79899 Other long term (current) drug therapy: Secondary | ICD-10-CM | POA: Diagnosis not present

## 2019-12-10 DIAGNOSIS — I10 Essential (primary) hypertension: Secondary | ICD-10-CM | POA: Diagnosis not present

## 2019-12-10 DIAGNOSIS — Z9049 Acquired absence of other specified parts of digestive tract: Secondary | ICD-10-CM | POA: Diagnosis not present

## 2019-12-10 DIAGNOSIS — R519 Headache, unspecified: Secondary | ICD-10-CM | POA: Diagnosis not present

## 2019-12-10 NOTE — Telephone Encounter (Signed)
Patient called and spoke with [facility] on 12/10/2019 at 12"20pm. States has bp 201/120- 197/120- both reading came up, just got out of hospital for kidney stone. Pt had sepsis in the hospital. Has nasuea and Headache.   states she was just discharged from hospital on Sunday was in for kidney stone and sepsis. Is on Cefdinir 300mg . Has a headache and nausea. Headache is not improving or going away- constant throbbing headache. BP 201/120 and 197/120, HR 80bpm. Is on Losartan. Had stent placed in kidney within last 2 weeks. No fever.  Advised to go to ED now.

## 2019-12-11 ENCOUNTER — Telehealth: Payer: Self-pay

## 2019-12-11 NOTE — Telephone Encounter (Signed)
New message    The patient wants to know can Dr. Alain Marion called in medication for nausea.   [place], [place] - [id number] [address]

## 2019-12-11 NOTE — Telephone Encounter (Signed)
Agree. Thanks

## 2019-12-12 ENCOUNTER — Ambulatory Visit: Payer: Medicare HMO | Admitting: Obstetrics and Gynecology

## 2019-12-12 DIAGNOSIS — Z960 Presence of urogenital implants: Secondary | ICD-10-CM | POA: Diagnosis not present

## 2019-12-12 DIAGNOSIS — Z87442 Personal history of urinary calculi: Secondary | ICD-10-CM | POA: Diagnosis not present

## 2019-12-12 DIAGNOSIS — R197 Diarrhea, unspecified: Secondary | ICD-10-CM | POA: Diagnosis not present

## 2019-12-12 DIAGNOSIS — K219 Gastro-esophageal reflux disease without esophagitis: Secondary | ICD-10-CM | POA: Diagnosis not present

## 2019-12-12 DIAGNOSIS — R110 Nausea: Secondary | ICD-10-CM | POA: Diagnosis not present

## 2019-12-12 DIAGNOSIS — R519 Headache, unspecified: Secondary | ICD-10-CM | POA: Diagnosis not present

## 2019-12-12 DIAGNOSIS — N23 Unspecified renal colic: Secondary | ICD-10-CM | POA: Diagnosis not present

## 2019-12-12 DIAGNOSIS — E785 Hyperlipidemia, unspecified: Secondary | ICD-10-CM | POA: Diagnosis not present

## 2019-12-12 DIAGNOSIS — I10 Essential (primary) hypertension: Secondary | ICD-10-CM | POA: Diagnosis not present

## 2019-12-12 MED ORDER — ONDANSETRON HCL 4 MG PO TABS: 4.0000 mg | ORAL_TABLET | Freq: Three times a day (TID) | ORAL | 0 refills | Status: AC | PRN

## 2019-12-12 NOTE — Telephone Encounter (Signed)
I emailed a prescription for Zofran.  Thanks

## 2019-12-13 NOTE — Telephone Encounter (Signed)
Patient informed. 

## 2019-12-19 ENCOUNTER — Other Ambulatory Visit: Payer: Self-pay

## 2019-12-19 ENCOUNTER — Encounter: Payer: Self-pay | Admitting: Internal Medicine

## 2019-12-19 ENCOUNTER — Ambulatory Visit (INDEPENDENT_AMBULATORY_CARE_PROVIDER_SITE_OTHER): Payer: Medicare HMO | Admitting: Internal Medicine

## 2019-12-19 DIAGNOSIS — R6883 Chills (without fever): Secondary | ICD-10-CM

## 2019-12-19 DIAGNOSIS — R3989 Other symptoms and signs involving the genitourinary system: Secondary | ICD-10-CM | POA: Diagnosis not present

## 2019-12-19 DIAGNOSIS — R109 Unspecified abdominal pain: Secondary | ICD-10-CM

## 2019-12-19 DIAGNOSIS — N200 Calculus of kidney: Secondary | ICD-10-CM | POA: Diagnosis not present

## 2019-12-19 DIAGNOSIS — I10 Essential (primary) hypertension: Secondary | ICD-10-CM

## 2019-12-19 LAB — URINALYSIS
Bilirubin Urine: NEGATIVE
Hgb urine dipstick: NEGATIVE
Ketones, ur: NEGATIVE
Leukocytes,Ua: NEGATIVE
Nitrite: NEGATIVE
Specific Gravity, Urine: <=1.005 — AB (ref 1.000–1.030)
Total Protein, Urine: NEGATIVE
Urine Glucose: NEGATIVE
Urobilinogen, UA: 0.2 (ref 0.0–1.0)
pH: 6 (ref 5.0–8.0)

## 2019-12-19 MED ORDER — CLONIDINE HCL 0.1 MG PO TABS
0.1000 mg | ORAL_TABLET | Freq: Two times a day (BID) | ORAL | 3 refills | Status: AC | PRN
Start: 2019-12-19 — End: ?

## 2019-12-19 NOTE — Progress Notes (Signed)
Subjective:  Patient ID: Danielle [name], female    DOB: 07/13/1955  Age: 64 y.o. MRN: [id number]  CC: No chief complaint on file.   HPI Danielle [name] presents for HTN, recent pyelo, B12 def f/u C/o side effects w/chlorthalidone   Outpatient Medications Prior to Visit  Medication Sig Dispense Refill  . bumetanide (BUMEX) 0.5 MG tablet Take 1 tablet (0.5 mg total) by mouth daily. Annual appt due in August must see provider for future refills 90 tablet 3  . Cholecalciferol (VITAMIN D3) 1.25 MG (50000 UT) CAPS Take 1 capsule by mouth every 30 (thirty) days. 3 capsule 4  . cyanocobalamin (,VITAMIN B-12,) 1000 MCG/ML injection Inject 1 mL (1,000 mcg total) into the skin every 30 (thirty) days. 10 mL 6  . famotidine (PEPCID) 40 MG tablet TAKE 1 TABLET BY MOUTH ONCE DAILY AT BEDTIME 90 tablet 3  . losartan (COZAAR) 100 MG tablet Take 1 tablet (100 mg total) by mouth daily. 90 tablet 3  . cephALEXin (KEFLEX) 500 MG capsule Take 1 capsule (500 mg total) by mouth 4 (four) times daily. 28 capsule 0  . diclofenac Sodium (VOLTAREN) 1 % GEL Apply 2 g topically 4 (four) times daily. Rub into affected area of foot 2 to 4 times daily (Patient not taking: Reported on 12/19/2019) 100 g 2  . Diclofenac Sodium CR 100 MG 24 hr tablet Take 1 tablet (100 mg total) by mouth daily. Take with food (Patient not taking: Reported on 12/19/2019) 30 tablet 2  . fluocinolone (VANOS) 0.01 % cream Use once daily on scalp rash  (Patient not taking: Reported on 12/19/2019)    . loratadine (CLARITIN) 10 MG tablet Take 1 tablet (10 mg total) by mouth daily. (Patient not taking: Reported on 12/19/2019) 100 tablet 3  . meloxicam (MOBIC) 7.5 MG tablet Take 1 tablet (7.5 mg total) by mouth daily as needed for pain. (Patient not taking: Reported on 12/19/2019) 30 tablet 1  . mometasone (ELOCON) 0.1 % lotion APPLY   TOPICALLY TO AFFECTED AREA TWICE DAILY (Patient not taking: Reported on 12/19/2019) 60 mL 1  . ondansetron (ZOFRAN) 4 MG tablet  Take 1 tablet (4 mg total) by mouth every 8 (eight) hours as needed for nausea or vomiting. (Patient not taking: Reported on 12/19/2019) 20 tablet 0  . phentermine (ADIPEX-P) 37.5 MG tablet Take 1 tablet (37.5 mg total) by mouth daily before breakfast. 30 tablet 0  . rosuvastatin (CRESTOR) 5 MG tablet Take 1 tablet (5 mg total) by mouth daily. (Patient not taking: Reported on 12/19/2019) 90 tablet 1  . SYRINGE-NEEDLE, DISP, 3 ML (BD ECLIPSE SYRINGE) 23G X 1" 3 ML MISC Use sq as directed (Patient not taking: Reported on 12/19/2019) 50 each 2  . traMADol (ULTRAM) 50 MG tablet TAKE 1 TABLET BY MOUTH EVERY 8 HOURS AS NEEDED FOR SEVERE PAIN (Patient not taking: Reported on 12/19/2019) 60 tablet 1  . triamcinolone cream (KENALOG) 0.1 % Apply 1 application topically 4 (four) times daily. (Patient not taking: Reported on 12/19/2019) 80 g 1   No facility-administered medications prior to visit.    ROS: Review of Systems  Constitutional: Positive for fatigue and unexpected weight change. Negative for activity change, appetite change and chills.  HENT: Negative for congestion, mouth sores and sinus pressure.   Eyes: Negative for visual disturbance.  Respiratory: Negative for cough and chest tightness.   Gastrointestinal: Negative for abdominal pain and nausea.  Genitourinary: Negative for difficulty urinating, frequency and vaginal pain.  Musculoskeletal: Negative for back pain and gait problem.  Skin: Negative for pallor and rash.  Neurological: Positive for weakness. Negative for dizziness, tremors, numbness and headaches.  Psychiatric/Behavioral: Negative for confusion, sleep disturbance and suicidal ideas.    Objective:  BP 130/90 (BP Location: Left Arm, Patient Position: Sitting, Cuff Size: Normal)   Pulse 88   Temp 98.5 F (36.9 C) (Oral)   Ht 5\' 2"  (1.575 m)   Wt 155 lb (70.3 kg)   SpO2 97%   BMI 28.35 kg/m   BP Readings from Last 3 Encounters:  12/19/19 130/90  11/18/19 (!) 176/98   11/07/19 (!) 226/122    Wt Readings from Last 3 Encounters:  12/19/19 155 lb (70.3 kg)  11/18/19 165 lb (74.8 kg)  11/07/19 166 lb (75.3 kg)    Physical Exam Constitutional:      General: She is not in acute distress.    Appearance: She is well-developed. She is not ill-appearing or diaphoretic.  HENT:     Head: Normocephalic.     Right Ear: External ear normal.     Left Ear: External ear normal.     Nose: Nose normal.  Eyes:     General:        Right eye: No discharge.        Left eye: No discharge.     Conjunctiva/sclera: Conjunctivae normal.     Pupils: Pupils are equal, round, and reactive to light.  Neck:     Thyroid: No thyromegaly.     Vascular: No JVD.     Trachea: No tracheal deviation.  Cardiovascular:     Rate and Rhythm: Normal rate and regular rhythm.     Heart sounds: Normal heart sounds.  Pulmonary:     Effort: No respiratory distress.     Breath sounds: No stridor. No wheezing.  Abdominal:     General: Bowel sounds are normal. There is no distension.     Palpations: Abdomen is soft. There is no mass.     Tenderness: There is no abdominal tenderness. There is no guarding or rebound.  Musculoskeletal:        General: No tenderness.     Cervical back: Normal range of motion and neck supple.  Lymphadenopathy:     Cervical: No cervical adenopathy.  Skin:    Findings: No erythema or rash.  Neurological:     Cranial Nerves: No cranial nerve deficit.     Motor: No abnormal muscle tone.     Coordination: Coordination normal.     Deep Tendon Reflexes: Reflexes normal.  Psychiatric:        Behavior: Behavior normal.        Thought Content: Thought content normal.        Judgment: Judgment normal.    A complex case.  Hospital records reviewed. Lab Results  Component Value Date   WBC 9.5 11/18/2019   HGB 12.9 11/18/2019   HCT 37.4 11/18/2019   PLT 445.0 (H) 11/18/2019   GLUCOSE 108 (H) 11/18/2019   CHOL 313 (H) 02/15/2018   TRIG 324.0 (H)  02/15/2018   HDL 57.80 02/15/2018   LDLDIRECT 231.0 02/15/2018   LDLCALC 175 (H) 01/25/2017   ALT 15 11/18/2019   AST 16 11/18/2019   NA 140 11/18/2019   K 3.7 11/18/2019   CL 101 11/18/2019   CREATININE 1.14 11/18/2019   BUN 20 11/18/2019   CO2 27 11/18/2019   TSH 1.19 11/18/2019   HGBA1C 5.6 12/12/2016  CT RENAL STONE STUDY  Result Date: 11/21/2019 CLINICAL DATA:  Left-sided flank pain with chills, initial encounter EXAM: CT ABDOMEN AND PELVIS WITHOUT CONTRAST TECHNIQUE: Multidetector CT imaging of the abdomen and pelvis was performed following the standard protocol without IV contrast. COMPARISON:  12/07/2005 FINDINGS: Lower chest: Lung bases are free of acute infiltrate or sizable effusion. Hepatobiliary: Gallbladder has been surgically removed. Pneumobilia is seen. This is stable from the prior exam. No focal mass is noted. Pancreas: Unremarkable. No pancreatic ductal dilatation or surrounding inflammatory changes. Spleen: Normal in size without focal abnormality. Adrenals/Urinary Tract: Adrenal glands are within normal limits. The right kidney shows no obstructive changes or renal calculi. The left kidney demonstrates significant increased perinephric stranding as well as hydronephrosis and a 9 mm central renal pelvic stone. Considerable air is noted within the collecting system in the kidney consistent with emphysematous pyelonephritis. The more distal ureter is mildly prominent with scattered foci of air. No other calculi are seen. The bladder is partially distended. Some air is noted within the bladder consistent with the emphysematous changes in the kidney. Stomach/Bowel: Diverticular change of the colon is noted without evidence of diverticulitis. Anterior abdominal wall hernia is noted containing small bowel loops as well as a loop of transverse colon. The appendix is within normal limits. Small bowel shows no obstructive changes although multiple loops are noted within anterior  abdominal hernia. Vascular/Lymphatic: Aortic atherosclerosis. No enlarged abdominal or pelvic lymph nodes. Reproductive: Uterus and bilateral adnexa are unremarkable. Other: No free fluid is noted. Large anterior abdominal wall hernia is noted with a neck of approximately 7.7 cm. Omental fat as well as small and large bowel are herniated into the defect without incarceration. Musculoskeletal: Degenerative changes of lumbar spine are noted. 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 Mild obstructive changes are noted as well. Chronic changes as described above to include diverticulosis and abdominal wall hernia. These results will be called to the ordering clinician or representative by the Radiologist Assistant, and communication documented in the PACS or [employer]. As this study is being interpreted after hours, attempts are being made to reach the physician or physician's service to recommend antibiotic therapy. Electronically Signed   By: Inez Catalina M.D.   On: 11/21/2019 21:01    Assessment & Plan:   There are no diagnoses linked to this encounter.   No orders of the defined types were placed in this encounter.    Follow-up: No follow-ups on file.  Walker Kehr, MD

## 2019-12-19 NOTE — Assessment & Plan Note (Signed)
s/p sepsis (admitted on 5/27-5/30/2021), nephrolithiasis s/p cystoscopy, retrograde pyelogram, LEFT ureteroscopy/laser lithotripsy and LEFT ureteral stent exchange (all performed on 12/05/2019

## 2019-12-19 NOTE — Assessment & Plan Note (Signed)
S/p sepsis (admitted on 5/27-5/30/2021), nephrolithiasis s/p cystoscopy, retrograde pyelogram, LEFT ureteroscopy/laser lithotripsy and LEFT ureteral stent exchange (all performed on 12/05/2019

## 2019-12-19 NOTE — Assessment & Plan Note (Signed)
Better  

## 2019-12-19 NOTE — Assessment & Plan Note (Addendum)
6/21 S/p ER visit - given chlorthalidone 25 mg/d - making her tired  - d/c  On Losartan, Bumex

## 2020-01-09 ENCOUNTER — Telehealth: Payer: Self-pay

## 2020-01-09 DIAGNOSIS — I700 Atherosclerosis of aorta: Secondary | ICD-10-CM | POA: Diagnosis not present

## 2020-01-09 DIAGNOSIS — Z9049 Acquired absence of other specified parts of digestive tract: Secondary | ICD-10-CM | POA: Diagnosis not present

## 2020-01-09 DIAGNOSIS — K760 Fatty (change of) liver, not elsewhere classified: Secondary | ICD-10-CM | POA: Diagnosis not present

## 2020-01-09 DIAGNOSIS — N390 Urinary tract infection, site not specified: Secondary | ICD-10-CM | POA: Diagnosis not present

## 2020-01-09 DIAGNOSIS — K469 Unspecified abdominal hernia without obstruction or gangrene: Secondary | ICD-10-CM | POA: Diagnosis not present

## 2020-01-09 DIAGNOSIS — K5730 Diverticulosis of large intestine without perforation or abscess without bleeding: Secondary | ICD-10-CM | POA: Diagnosis not present

## 2020-01-09 DIAGNOSIS — N1330 Unspecified hydronephrosis: Secondary | ICD-10-CM | POA: Diagnosis not present

## 2020-01-09 DIAGNOSIS — I10 Essential (primary) hypertension: Secondary | ICD-10-CM | POA: Diagnosis not present

## 2020-01-09 DIAGNOSIS — D72829 Elevated white blood cell count, unspecified: Secondary | ICD-10-CM | POA: Diagnosis not present

## 2020-01-09 DIAGNOSIS — E785 Hyperlipidemia, unspecified: Secondary | ICD-10-CM | POA: Diagnosis not present

## 2020-01-09 DIAGNOSIS — K439 Ventral hernia without obstruction or gangrene: Secondary | ICD-10-CM | POA: Diagnosis not present

## 2020-01-09 DIAGNOSIS — R319 Hematuria, unspecified: Secondary | ICD-10-CM | POA: Diagnosis not present

## 2020-01-09 NOTE — Telephone Encounter (Signed)
Per [facility] on 01/09/2020 10:59:54 AM, Caller states she had a 71mm stone that was busted about a month ago. Caller states she never got all her strength back, but this week she has not felt well. She has started getting nauseous and having pretty severe kidney pain. She took 3 Tylenol and lied on a heating pad, but it still hurts and also hurts her back around to her abdomen to breathe.  Stent has been removed. Caller states she never got all her strength back, but this week she has not felt well since tuesday. She has started getting nauseous yesterday and having pretty severe kidney pain. Last urine was this morning and it was a good stream. Pain is 6/10. Drinking good but has no appetite. No fever.  Advised to see PCP within 24 hours.

## 2020-01-09 NOTE — Telephone Encounter (Signed)
Pt already been to ER.

## 2020-01-10 DIAGNOSIS — N200 Calculus of kidney: Secondary | ICD-10-CM | POA: Diagnosis not present

## 2020-01-16 ENCOUNTER — Other Ambulatory Visit: Payer: Self-pay | Admitting: Internal Medicine

## 2020-01-16 DIAGNOSIS — M2550 Pain in unspecified joint: Secondary | ICD-10-CM

## 2020-01-17 ENCOUNTER — Telehealth: Payer: Self-pay | Admitting: Internal Medicine

## 2020-01-17 DIAGNOSIS — N132 Hydronephrosis with renal and ureteral calculous obstruction: Secondary | ICD-10-CM | POA: Diagnosis not present

## 2020-01-17 DIAGNOSIS — A419 Sepsis, unspecified organism: Secondary | ICD-10-CM | POA: Diagnosis not present

## 2020-01-17 DIAGNOSIS — N2889 Other specified disorders of kidney and ureter: Secondary | ICD-10-CM | POA: Diagnosis not present

## 2020-01-17 DIAGNOSIS — N390 Urinary tract infection, site not specified: Secondary | ICD-10-CM | POA: Diagnosis not present

## 2020-01-17 DIAGNOSIS — J439 Emphysema, unspecified: Secondary | ICD-10-CM | POA: Diagnosis not present

## 2020-01-17 NOTE — Telephone Encounter (Signed)
New message:   LVM for patient to call back and schedule an appt to see Dr. Camila Li for referral to be made.

## 2020-02-07 ENCOUNTER — Other Ambulatory Visit: Payer: Self-pay | Admitting: Internal Medicine

## 2020-02-17 ENCOUNTER — Other Ambulatory Visit: Payer: Self-pay | Admitting: Internal Medicine

## 2020-02-24 ENCOUNTER — Ambulatory Visit: Payer: Medicare HMO | Admitting: Obstetrics and Gynecology

## 2020-03-09 ENCOUNTER — Inpatient Hospital Stay: Payer: Medicare HMO | Admitting: Internal Medicine

## 2020-03-11 DIAGNOSIS — R300 Dysuria: Secondary | ICD-10-CM | POA: Diagnosis not present

## 2020-03-17 ENCOUNTER — Ambulatory Visit (INDEPENDENT_AMBULATORY_CARE_PROVIDER_SITE_OTHER): Payer: Medicare HMO

## 2020-03-17 ENCOUNTER — Encounter: Payer: Self-pay | Admitting: Internal Medicine

## 2020-03-17 ENCOUNTER — Ambulatory Visit (INDEPENDENT_AMBULATORY_CARE_PROVIDER_SITE_OTHER): Payer: Medicare HMO | Admitting: Internal Medicine

## 2020-03-17 ENCOUNTER — Other Ambulatory Visit: Payer: Self-pay

## 2020-03-17 VITALS — BP 160/92 | HR 86 | Temp 98.3°F | Ht 62.0 in | Wt 151.0 lb

## 2020-03-17 DIAGNOSIS — N390 Urinary tract infection, site not specified: Secondary | ICD-10-CM | POA: Insufficient documentation

## 2020-03-17 DIAGNOSIS — R7989 Other specified abnormal findings of blood chemistry: Secondary | ICD-10-CM

## 2020-03-17 DIAGNOSIS — N10 Acute tubulo-interstitial nephritis: Secondary | ICD-10-CM

## 2020-03-17 DIAGNOSIS — E785 Hyperlipidemia, unspecified: Secondary | ICD-10-CM | POA: Diagnosis not present

## 2020-03-17 DIAGNOSIS — N200 Calculus of kidney: Secondary | ICD-10-CM | POA: Diagnosis not present

## 2020-03-17 DIAGNOSIS — E538 Deficiency of other specified B group vitamins: Secondary | ICD-10-CM

## 2020-03-17 DIAGNOSIS — R0600 Dyspnea, unspecified: Secondary | ICD-10-CM

## 2020-03-17 DIAGNOSIS — R0602 Shortness of breath: Secondary | ICD-10-CM | POA: Diagnosis not present

## 2020-03-17 DIAGNOSIS — I10 Essential (primary) hypertension: Secondary | ICD-10-CM

## 2020-03-17 DIAGNOSIS — R0609 Other forms of dyspnea: Secondary | ICD-10-CM

## 2020-03-17 MED ORDER — CIPROFLOXACIN HCL 500 MG PO TABS
500.0000 mg | ORAL_TABLET | Freq: Two times a day (BID) | ORAL | 0 refills | Status: AC
Start: 2020-03-17 — End: ?

## 2020-03-17 NOTE — Assessment & Plan Note (Signed)
On B12 

## 2020-03-17 NOTE — Assessment & Plan Note (Signed)
BP Readings from Last 3 Encounters:  03/17/20 (!) 160/92  12/19/19 130/90  11/18/19 (!) 176/98

## 2020-03-17 NOTE — Assessment & Plan Note (Signed)
Pt thinks she just passed a stone

## 2020-03-17 NOTE — Progress Notes (Signed)
Subjective:  Patient ID: Danielle [name], female    DOB: August 17, 1955  Age: 64 y.o. MRN: [id number]  CC: No chief complaint on file.   HPI Ros[place] A Alvarenga presents for a HTN, B12 def, LBP f/u C/o urinary burning today - on Cipro now C/o occasional SOB  Outpatient Medications Prior to Visit  Medication Sig Dispense Refill  . bumetanide (BUMEX) 0.5 MG tablet Take 1 tablet (0.5 mg total) by mouth daily. Annual appt due in August must see provider for future refills 90 tablet 3  . Cholecalciferol (VITAMIN D3) 1.25 MG (50000 UT) CAPS Take 1 capsule by mouth every 30 (thirty) days. 3 capsule 4  . cloNIDine (CATAPRES) 0.1 MG tablet Take 1 tablet (0.1 mg total) by mouth 2 (two) times daily as needed. For BP>180/100 60 tablet 3  . cyanocobalamin (,VITAMIN B-12,) 1000 MCG/ML injection Inject 1 mL (1,000 mcg total) into the skin every 30 (thirty) days. 10 mL 6  . diclofenac Sodium (VOLTAREN) 1 % GEL Apply 2 g topically 4 (four) times daily. Rub into affected area of foot 2 to 4 times daily 100 g 2  . Diclofenac Sodium CR 100 MG 24 hr tablet Take 1 tablet (100 mg total) by mouth daily. Take with food 30 tablet 2  . famotidine (PEPCID) 40 MG tablet TAKE 1 TABLET BY MOUTH ONCE DAILY AT BEDTIME 90 tablet 3  . fluocinolone (VANOS) 0.01 % cream Use once daily on scalp rash     . loratadine (CLARITIN) 10 MG tablet Take 1 tablet (10 mg total) by mouth daily. 100 tablet 3  . losartan (COZAAR) 100 MG tablet Take 1 tablet (100 mg total) by mouth daily. 90 tablet 3  . meloxicam (MOBIC) 7.5 MG tablet Take 1 tablet (7.5 mg total) by mouth daily as needed for pain. 30 tablet 1  . mometasone (ELOCON) 0.1 % lotion APPLY  LOTION TOPICALLY TWICE DAILY TO AFFECTED AREA 60 mL 0  . ondansetron (ZOFRAN) 4 MG tablet Take 1 tablet (4 mg total) by mouth every 8 (eight) hours as needed for nausea or vomiting. 20 tablet 0  . rosuvastatin (CRESTOR) 5 MG tablet Take 1 tablet (5 mg total) by mouth daily. 90 tablet 1  . SYRINGE-NEEDLE,  DISP, 3 ML (BD ECLIPSE SYRINGE) 23G X 1" 3 ML MISC Use sq as directed 50 each 2  . traMADol (ULTRAM) 50 MG tablet TAKE 1 TABLET BY MOUTH EVERY 8 HOURS AS NEEDED FOR SEVERE PAIN 60 tablet 1  . triamcinolone cream (KENALOG) 0.1 % Apply 1 application topically 4 (four) times daily. 80 g 1  . phentermine (ADIPEX-P) 37.5 MG tablet Take 1 tablet (37.5 mg total) by mouth daily before breakfast. 30 tablet 0   No facility-administered medications prior to visit.    ROS: Review of Systems  Constitutional: Negative for activity change, appetite change, chills, fatigue and unexpected weight change.  HENT: Negative for congestion, mouth sores and sinus pressure.   Eyes: Negative for visual disturbance.  Respiratory: Negative for cough and chest tightness.   Gastrointestinal: Negative for abdominal pain and nausea.  Genitourinary: Positive for dysuria. Negative for difficulty urinating, frequency and vaginal pain.  Musculoskeletal: Positive for back pain. Negative for gait problem.  Skin: Negative for pallor and rash.  Neurological: Negative for dizziness, tremors, weakness, numbness and headaches.  Psychiatric/Behavioral: Negative for confusion and sleep disturbance.    Objective:  BP (!) 160/92 (BP Location: Left Arm, Patient Position: Sitting, Cuff Size: Large)   Pulse 86  Temp 98.3 F (36.8 C) (Oral)   Ht 5\' 2"  (1.575 m)   Wt 151 lb (68.5 kg)   SpO2 95%   BMI 27.62 kg/m   BP Readings from Last 3 Encounters:  03/17/20 (!) 160/92  12/19/19 130/90  11/18/19 (!) 176/98    Wt Readings from Last 3 Encounters:  03/17/20 151 lb (68.5 kg)  12/19/19 155 lb (70.3 kg)  11/18/19 165 lb (74.8 kg)    Physical Exam Constitutional:      General: She is not in acute distress.    Appearance: She is well-developed.  HENT:     Head: Normocephalic.     Right Ear: External ear normal.     Left Ear: External ear normal.     Nose: Nose normal.  Eyes:     General:        Right eye: No discharge.         Left eye: No discharge.     Conjunctiva/sclera: Conjunctivae normal.     Pupils: Pupils are equal, round, and reactive to light.  Neck:     Thyroid: No thyromegaly.     Vascular: No JVD.     Trachea: No tracheal deviation.  Cardiovascular:     Rate and Rhythm: Normal rate and regular rhythm.     Heart sounds: Normal heart sounds.  Pulmonary:     Effort: No respiratory distress.     Breath sounds: No stridor. No wheezing.  Abdominal:     General: Bowel sounds are normal. There is no distension.     Palpations: Abdomen is soft. There is no mass.     Tenderness: There is no abdominal tenderness. There is no guarding or rebound.  Musculoskeletal:        General: No tenderness.     Cervical back: Normal range of motion and neck supple.  Lymphadenopathy:     Cervical: No cervical adenopathy.  Skin:    Findings: No erythema or rash.  Neurological:     Mental Status: She is oriented to person, place, and time.     Cranial Nerves: No cranial nerve deficit.     Motor: No abnormal muscle tone.     Coordination: Coordination normal.     Deep Tendon Reflexes: Reflexes normal.  Psychiatric:        Behavior: Behavior normal.        Thought Content: Thought content normal.        Judgment: Judgment normal.     Lab Results  Component Value Date   WBC 9.5 11/18/2019   HGB 12.9 11/18/2019   HCT 37.4 11/18/2019   PLT 445.0 (H) 11/18/2019   GLUCOSE 108 (H) 11/18/2019   CHOL 313 (H) 02/15/2018   TRIG 324.0 (H) 02/15/2018   HDL 57.80 02/15/2018   LDLDIRECT 231.0 02/15/2018   LDLCALC 175 (H) 01/25/2017   ALT 15 11/18/2019   AST 16 11/18/2019   NA 140 11/18/2019   K 3.7 11/18/2019   CL 101 11/18/2019   CREATININE 1.14 11/18/2019   BUN 20 11/18/2019   CO2 27 11/18/2019   TSH 1.19 11/18/2019   HGBA1C 5.6 12/12/2016    CT RENAL STONE STUDY  Result Date: 11/21/2019 CLINICAL DATA:  Left-sided flank pain with chills, initial encounter EXAM: CT ABDOMEN AND PELVIS WITHOUT  CONTRAST TECHNIQUE: Multidetector CT imaging of the abdomen and pelvis was performed following the standard protocol without IV contrast. COMPARISON:  12/07/2005 FINDINGS: Lower chest: Lung bases are free of acute infiltrate or sizable  effusion. Hepatobiliary: Gallbladder has been surgically removed. Pneumobilia is seen. This is stable from the prior exam. No focal mass is noted. Pancreas: Unremarkable. No pancreatic ductal dilatation or surrounding inflammatory changes. Spleen: Normal in size without focal abnormality. Adrenals/Urinary Tract: Adrenal glands are within normal limits. The right kidney shows no obstructive changes or renal calculi. The left kidney demonstrates significant increased perinephric stranding as well as hydronephrosis and a 9 mm central renal pelvic stone. Considerable air is noted within the collecting system in the kidney consistent with emphysematous pyelonephritis. The more distal ureter is mildly prominent with scattered foci of air. No other calculi are seen. The bladder is partially distended. Some air is noted within the bladder consistent with the emphysematous changes in the kidney. Stomach/Bowel: Diverticular change of the colon is noted without evidence of diverticulitis. Anterior abdominal wall hernia is noted containing small bowel loops as well as a loop of transverse colon. The appendix is within normal limits. Small bowel shows no obstructive changes although multiple loops are noted within anterior abdominal hernia. Vascular/Lymphatic: Aortic atherosclerosis. No enlarged abdominal or pelvic lymph nodes. Reproductive: Uterus and bilateral adnexa are unremarkable. Other: No free fluid is noted. Large anterior abdominal wall hernia is noted with a neck of approximately 7.7 cm. Omental fat as well as small and large bowel are herniated into the defect without incarceration. Musculoskeletal: Degenerative changes of lumbar spine are noted. IMPRESSION: Changes consistent with  emphysematous pyelonephritis within the left kidney with associated 9 mm renal pelvic stone. Mild obstructive changes are noted as well. Chronic changes as described above to include diverticulosis and abdominal wall hernia. These results will be called to the ordering clinician or representative by the Radiologist Assistant, and communication documented in the PACS or [employer]. As this study is being interpreted after hours, attempts are being made to reach the physician or physician's service to recommend antibiotic therapy. Electronically Signed   By: Inez Catalina M.D.   On: 11/21/2019 21:01    Assessment & Plan:    Walker Kehr, MD

## 2020-03-17 NOTE — Assessment & Plan Note (Signed)
Serratia. Pt thinks she just passed a stone. Refilled Cipro

## 2020-03-17 NOTE — Assessment & Plan Note (Signed)
Pt declined Crestor

## 2020-03-17 NOTE — Addendum Note (Signed)
Addended by: Hazle Quant on: 03/17/2020 03:57 PM   Modules accepted: Orders

## 2020-03-18 DIAGNOSIS — N200 Calculus of kidney: Secondary | ICD-10-CM | POA: Diagnosis not present

## 2020-03-18 DIAGNOSIS — N12 Tubulo-interstitial nephritis, not specified as acute or chronic: Secondary | ICD-10-CM | POA: Diagnosis not present

## 2020-03-18 LAB — URINALYSIS
Bilirubin Urine: NEGATIVE
Glucose, UA: NEGATIVE
Hgb urine dipstick: NEGATIVE
Ketones, ur: NEGATIVE
Leukocytes,Ua: NEGATIVE
Nitrite: NEGATIVE
Protein, ur: NEGATIVE
Specific Gravity, Urine: 1.007 (ref 1.001–1.03)
pH: 6.5 (ref 5.0–8.0)

## 2020-03-19 LAB — CULTURE, URINE COMPREHENSIVE: RESULT:: NO GROWTH

## 2020-03-21 ENCOUNTER — Other Ambulatory Visit: Payer: Self-pay | Admitting: Internal Medicine

## 2020-03-24 ENCOUNTER — Ambulatory Visit: Payer: Medicare HMO | Admitting: Obstetrics and Gynecology

## 2020-03-25 DIAGNOSIS — N200 Calculus of kidney: Secondary | ICD-10-CM | POA: Diagnosis not present

## 2020-03-25 DIAGNOSIS — K439 Ventral hernia without obstruction or gangrene: Secondary | ICD-10-CM | POA: Diagnosis not present

## 2020-03-25 DIAGNOSIS — N12 Tubulo-interstitial nephritis, not specified as acute or chronic: Secondary | ICD-10-CM | POA: Diagnosis not present

## 2020-03-25 DIAGNOSIS — N132 Hydronephrosis with renal and ureteral calculous obstruction: Secondary | ICD-10-CM | POA: Diagnosis not present

## 2020-03-25 DIAGNOSIS — K5730 Diverticulosis of large intestine without perforation or abscess without bleeding: Secondary | ICD-10-CM | POA: Diagnosis not present

## 2020-05-16 ENCOUNTER — Other Ambulatory Visit: Payer: Self-pay | Admitting: Internal Medicine

## 2020-06-17 ENCOUNTER — Ambulatory Visit: Payer: Medicare HMO | Admitting: Internal Medicine

## 2020-06-17 DIAGNOSIS — Z0289 Encounter for other administrative examinations: Secondary | ICD-10-CM

## 2020-06-21 ENCOUNTER — Other Ambulatory Visit: Payer: Self-pay | Admitting: Internal Medicine

## 2020-07-28 ENCOUNTER — Ambulatory Visit: Payer: Medicare HMO | Admitting: Internal Medicine

## 2020-08-31 ENCOUNTER — Ambulatory Visit: Payer: Medicare HMO | Admitting: Internal Medicine

## 2020-10-07 DIAGNOSIS — H2513 Age-related nuclear cataract, bilateral: Secondary | ICD-10-CM | POA: Diagnosis not present

## 2020-10-17 ENCOUNTER — Other Ambulatory Visit: Payer: Self-pay | Admitting: Internal Medicine

## 2020-10-19 NOTE — Telephone Encounter (Signed)
Check [place] registry last filled 05/16/2020.Marland KitchenJohny Chess

## 2020-11-12 DIAGNOSIS — R1084 Generalized abdominal pain: Secondary | ICD-10-CM | POA: Diagnosis not present

## 2020-11-12 DIAGNOSIS — D519 Vitamin B12 deficiency anemia, unspecified: Secondary | ICD-10-CM | POA: Diagnosis not present

## 2020-11-12 DIAGNOSIS — Z131 Encounter for screening for diabetes mellitus: Secondary | ICD-10-CM | POA: Diagnosis not present

## 2020-11-12 DIAGNOSIS — Z1389 Encounter for screening for other disorder: Secondary | ICD-10-CM | POA: Diagnosis not present

## 2020-11-12 DIAGNOSIS — I10 Essential (primary) hypertension: Secondary | ICD-10-CM | POA: Diagnosis not present

## 2020-11-12 DIAGNOSIS — Z1321 Encounter for screening for nutritional disorder: Secondary | ICD-10-CM | POA: Diagnosis not present

## 2020-11-12 DIAGNOSIS — M2550 Pain in unspecified joint: Secondary | ICD-10-CM | POA: Diagnosis not present

## 2020-11-12 DIAGNOSIS — T8144XA Sepsis following a procedure, initial encounter: Secondary | ICD-10-CM | POA: Diagnosis not present

## 2020-11-12 DIAGNOSIS — G894 Chronic pain syndrome: Secondary | ICD-10-CM | POA: Diagnosis not present

## 2020-12-22 DIAGNOSIS — H2513 Age-related nuclear cataract, bilateral: Secondary | ICD-10-CM | POA: Diagnosis not present

## 2021-01-12 ENCOUNTER — Telehealth: Payer: Self-pay | Admitting: Internal Medicine

## 2021-01-12 NOTE — Telephone Encounter (Signed)
LVM for pt to rtn my call to schedule AWV with [facility]. Please schedule this appt if pt calls the office.  °

## 2021-01-25 ENCOUNTER — Ambulatory Visit: Payer: Medicare HMO | Admitting: Internal Medicine

## 2021-01-25 DIAGNOSIS — R42 Dizziness and giddiness: Secondary | ICD-10-CM | POA: Diagnosis not present

## 2021-01-25 DIAGNOSIS — Z1389 Encounter for screening for other disorder: Secondary | ICD-10-CM | POA: Diagnosis not present

## 2021-01-25 DIAGNOSIS — I10 Essential (primary) hypertension: Secondary | ICD-10-CM | POA: Diagnosis not present

## 2021-01-25 DIAGNOSIS — Z712 Person consulting for explanation of examination or test findings: Secondary | ICD-10-CM | POA: Diagnosis not present

## 2021-01-25 DIAGNOSIS — H269 Unspecified cataract: Secondary | ICD-10-CM | POA: Diagnosis not present

## 2021-01-25 DIAGNOSIS — R350 Frequency of micturition: Secondary | ICD-10-CM | POA: Diagnosis not present

## 2021-01-25 DIAGNOSIS — R7303 Prediabetes: Secondary | ICD-10-CM | POA: Diagnosis not present

## 2021-01-25 DIAGNOSIS — G894 Chronic pain syndrome: Secondary | ICD-10-CM | POA: Diagnosis not present

## 2021-01-28 DIAGNOSIS — M7062 Trochanteric bursitis, left hip: Secondary | ICD-10-CM | POA: Diagnosis not present

## 2021-01-28 DIAGNOSIS — M19072 Primary osteoarthritis, left ankle and foot: Secondary | ICD-10-CM | POA: Diagnosis not present

## 2021-01-28 DIAGNOSIS — K219 Gastro-esophageal reflux disease without esophagitis: Secondary | ICD-10-CM | POA: Diagnosis not present

## 2021-01-28 DIAGNOSIS — M79642 Pain in left hand: Secondary | ICD-10-CM | POA: Diagnosis not present

## 2021-01-28 DIAGNOSIS — M5136 Other intervertebral disc degeneration, lumbar region: Secondary | ICD-10-CM | POA: Diagnosis not present

## 2021-01-28 DIAGNOSIS — M8949 Other hypertrophic osteoarthropathy, multiple sites: Secondary | ICD-10-CM | POA: Diagnosis not present

## 2021-01-28 DIAGNOSIS — M160 Bilateral primary osteoarthritis of hip: Secondary | ICD-10-CM | POA: Diagnosis not present

## 2021-01-28 DIAGNOSIS — M533 Sacrococcygeal disorders, not elsewhere classified: Secondary | ICD-10-CM | POA: Diagnosis not present

## 2021-01-28 DIAGNOSIS — M25551 Pain in right hip: Secondary | ICD-10-CM | POA: Diagnosis not present

## 2021-01-28 DIAGNOSIS — L409 Psoriasis, unspecified: Secondary | ICD-10-CM | POA: Diagnosis not present

## 2021-01-28 DIAGNOSIS — M4802 Spinal stenosis, cervical region: Secondary | ICD-10-CM | POA: Diagnosis not present

## 2021-01-28 DIAGNOSIS — M2578 Osteophyte, vertebrae: Secondary | ICD-10-CM | POA: Diagnosis not present

## 2021-01-28 DIAGNOSIS — M7061 Trochanteric bursitis, right hip: Secondary | ICD-10-CM | POA: Diagnosis not present

## 2021-01-28 DIAGNOSIS — I10 Essential (primary) hypertension: Secondary | ICD-10-CM | POA: Diagnosis not present

## 2021-01-28 DIAGNOSIS — M25562 Pain in left knee: Secondary | ICD-10-CM | POA: Diagnosis not present

## 2021-01-28 DIAGNOSIS — M25561 Pain in right knee: Secondary | ICD-10-CM | POA: Diagnosis not present

## 2021-01-28 DIAGNOSIS — M47812 Spondylosis without myelopathy or radiculopathy, cervical region: Secondary | ICD-10-CM | POA: Diagnosis not present

## 2021-01-28 DIAGNOSIS — M25552 Pain in left hip: Secondary | ICD-10-CM | POA: Diagnosis not present

## 2021-01-28 DIAGNOSIS — M542 Cervicalgia: Secondary | ICD-10-CM | POA: Diagnosis not present

## 2021-01-28 DIAGNOSIS — M5442 Lumbago with sciatica, left side: Secondary | ICD-10-CM | POA: Diagnosis not present

## 2021-01-29 DIAGNOSIS — Z01818 Encounter for other preprocedural examination: Secondary | ICD-10-CM | POA: Diagnosis not present

## 2021-02-01 DIAGNOSIS — I10 Essential (primary) hypertension: Secondary | ICD-10-CM | POA: Diagnosis not present

## 2021-02-01 DIAGNOSIS — K219 Gastro-esophageal reflux disease without esophagitis: Secondary | ICD-10-CM | POA: Diagnosis not present

## 2021-02-01 DIAGNOSIS — Z882 Allergy status to sulfonamides status: Secondary | ICD-10-CM | POA: Diagnosis not present

## 2021-02-01 DIAGNOSIS — H2511 Age-related nuclear cataract, right eye: Secondary | ICD-10-CM | POA: Diagnosis not present

## 2021-02-01 DIAGNOSIS — Z888 Allergy status to other drugs, medicaments and biological substances status: Secondary | ICD-10-CM | POA: Diagnosis not present

## 2021-02-01 DIAGNOSIS — Z87891 Personal history of nicotine dependence: Secondary | ICD-10-CM | POA: Diagnosis not present

## 2021-02-01 DIAGNOSIS — Z880 Allergy status to penicillin: Secondary | ICD-10-CM | POA: Diagnosis not present

## 2021-02-01 DIAGNOSIS — Z79899 Other long term (current) drug therapy: Secondary | ICD-10-CM | POA: Diagnosis not present

## 2021-02-01 DIAGNOSIS — E669 Obesity, unspecified: Secondary | ICD-10-CM | POA: Diagnosis not present

## 2021-03-12 DIAGNOSIS — M542 Cervicalgia: Secondary | ICD-10-CM | POA: Diagnosis not present

## 2021-03-12 DIAGNOSIS — M47816 Spondylosis without myelopathy or radiculopathy, lumbar region: Secondary | ICD-10-CM | POA: Diagnosis not present

## 2021-03-12 DIAGNOSIS — M5412 Radiculopathy, cervical region: Secondary | ICD-10-CM | POA: Diagnosis not present

## 2021-04-02 DIAGNOSIS — Z0100 Encounter for examination of eyes and vision without abnormal findings: Secondary | ICD-10-CM | POA: Diagnosis not present

## 2021-04-06 DIAGNOSIS — M5412 Radiculopathy, cervical region: Secondary | ICD-10-CM | POA: Diagnosis not present

## 2021-04-06 DIAGNOSIS — M542 Cervicalgia: Secondary | ICD-10-CM | POA: Diagnosis not present

## 2021-04-06 DIAGNOSIS — M4802 Spinal stenosis, cervical region: Secondary | ICD-10-CM | POA: Diagnosis not present

## 2021-04-06 DIAGNOSIS — M4722 Other spondylosis with radiculopathy, cervical region: Secondary | ICD-10-CM | POA: Diagnosis not present

## 2021-04-06 DIAGNOSIS — M47812 Spondylosis without myelopathy or radiculopathy, cervical region: Secondary | ICD-10-CM | POA: Diagnosis not present

## 2021-04-08 DIAGNOSIS — E782 Mixed hyperlipidemia: Secondary | ICD-10-CM | POA: Diagnosis not present

## 2021-04-08 DIAGNOSIS — Z1231 Encounter for screening mammogram for malignant neoplasm of breast: Secondary | ICD-10-CM | POA: Diagnosis not present

## 2021-04-08 DIAGNOSIS — I10 Essential (primary) hypertension: Secondary | ICD-10-CM | POA: Diagnosis not present

## 2021-04-08 DIAGNOSIS — M4802 Spinal stenosis, cervical region: Secondary | ICD-10-CM | POA: Diagnosis not present

## 2021-04-08 DIAGNOSIS — R7303 Prediabetes: Secondary | ICD-10-CM | POA: Diagnosis not present

## 2021-04-08 DIAGNOSIS — G894 Chronic pain syndrome: Secondary | ICD-10-CM | POA: Diagnosis not present

## 2021-04-08 DIAGNOSIS — Z0000 Encounter for general adult medical examination without abnormal findings: Secondary | ICD-10-CM | POA: Diagnosis not present

## 2021-04-13 DIAGNOSIS — M5412 Radiculopathy, cervical region: Secondary | ICD-10-CM | POA: Diagnosis not present

## 2021-04-13 DIAGNOSIS — M4802 Spinal stenosis, cervical region: Secondary | ICD-10-CM | POA: Diagnosis not present

## 2021-04-15 DIAGNOSIS — I700 Atherosclerosis of aorta: Secondary | ICD-10-CM | POA: Diagnosis not present

## 2021-04-15 DIAGNOSIS — R197 Diarrhea, unspecified: Secondary | ICD-10-CM | POA: Diagnosis not present

## 2021-04-15 DIAGNOSIS — K529 Noninfective gastroenteritis and colitis, unspecified: Secondary | ICD-10-CM | POA: Diagnosis not present

## 2021-04-15 DIAGNOSIS — R1084 Generalized abdominal pain: Secondary | ICD-10-CM | POA: Diagnosis not present

## 2021-09-06 DIAGNOSIS — U071 COVID-19: Secondary | ICD-10-CM | POA: Diagnosis not present

## 2021-10-22 DIAGNOSIS — E782 Mixed hyperlipidemia: Secondary | ICD-10-CM | POA: Diagnosis not present

## 2021-10-22 DIAGNOSIS — D519 Vitamin B12 deficiency anemia, unspecified: Secondary | ICD-10-CM | POA: Diagnosis not present

## 2021-10-22 DIAGNOSIS — F800 Phonological disorder: Secondary | ICD-10-CM | POA: Diagnosis not present

## 2021-10-22 DIAGNOSIS — G894 Chronic pain syndrome: Secondary | ICD-10-CM | POA: Diagnosis not present

## 2021-10-22 DIAGNOSIS — I10 Essential (primary) hypertension: Secondary | ICD-10-CM | POA: Diagnosis not present

## 2021-10-22 DIAGNOSIS — Z0130 Encounter for examination of blood pressure without abnormal findings: Secondary | ICD-10-CM | POA: Diagnosis not present

## 2021-10-22 DIAGNOSIS — R7303 Prediabetes: Secondary | ICD-10-CM | POA: Diagnosis not present

## 2021-10-22 DIAGNOSIS — M4802 Spinal stenosis, cervical region: Secondary | ICD-10-CM | POA: Diagnosis not present

## 2021-10-22 DIAGNOSIS — Z0000 Encounter for general adult medical examination without abnormal findings: Secondary | ICD-10-CM | POA: Diagnosis not present

## 2021-11-01 DIAGNOSIS — R197 Diarrhea, unspecified: Secondary | ICD-10-CM | POA: Diagnosis not present

## 2021-11-01 DIAGNOSIS — R1013 Epigastric pain: Secondary | ICD-10-CM | POA: Diagnosis not present

## 2021-11-01 DIAGNOSIS — K449 Diaphragmatic hernia without obstruction or gangrene: Secondary | ICD-10-CM | POA: Diagnosis not present

## 2021-11-01 DIAGNOSIS — E669 Obesity, unspecified: Secondary | ICD-10-CM | POA: Diagnosis not present

## 2021-11-01 DIAGNOSIS — Z6831 Body mass index (BMI) 31.0-31.9, adult: Secondary | ICD-10-CM | POA: Diagnosis not present

## 2021-11-01 DIAGNOSIS — R1084 Generalized abdominal pain: Secondary | ICD-10-CM | POA: Diagnosis not present

## 2021-11-01 DIAGNOSIS — K635 Polyp of colon: Secondary | ICD-10-CM | POA: Diagnosis not present

## 2021-11-01 DIAGNOSIS — K529 Noninfective gastroenteritis and colitis, unspecified: Secondary | ICD-10-CM | POA: Diagnosis not present

## 2021-11-01 DIAGNOSIS — K2950 Unspecified chronic gastritis without bleeding: Secondary | ICD-10-CM | POA: Diagnosis not present

## 2021-11-01 DIAGNOSIS — I10 Essential (primary) hypertension: Secondary | ICD-10-CM | POA: Diagnosis not present

## 2021-11-01 DIAGNOSIS — K31A0 Gastric intestinal metaplasia, unspecified: Secondary | ICD-10-CM | POA: Diagnosis not present

## 2021-11-01 DIAGNOSIS — D123 Benign neoplasm of transverse colon: Secondary | ICD-10-CM | POA: Diagnosis not present

## 2021-11-01 DIAGNOSIS — Z1211 Encounter for screening for malignant neoplasm of colon: Secondary | ICD-10-CM | POA: Diagnosis not present

## 2021-11-01 DIAGNOSIS — K5730 Diverticulosis of large intestine without perforation or abscess without bleeding: Secondary | ICD-10-CM | POA: Diagnosis not present

## 2021-11-01 DIAGNOSIS — K3189 Other diseases of stomach and duodenum: Secondary | ICD-10-CM | POA: Diagnosis not present

## 2021-11-20 DIAGNOSIS — G4730 Sleep apnea, unspecified: Secondary | ICD-10-CM | POA: Diagnosis not present

## 2021-11-20 DIAGNOSIS — Z888 Allergy status to other drugs, medicaments and biological substances status: Secondary | ICD-10-CM | POA: Diagnosis not present

## 2021-11-20 DIAGNOSIS — Z20822 Contact with and (suspected) exposure to covid-19: Secondary | ICD-10-CM | POA: Diagnosis not present

## 2021-11-20 DIAGNOSIS — M545 Low back pain, unspecified: Secondary | ICD-10-CM | POA: Diagnosis not present

## 2021-11-20 DIAGNOSIS — G8929 Other chronic pain: Secondary | ICD-10-CM | POA: Diagnosis not present

## 2021-11-20 DIAGNOSIS — N200 Calculus of kidney: Secondary | ICD-10-CM | POA: Diagnosis not present

## 2021-11-20 DIAGNOSIS — Z882 Allergy status to sulfonamides status: Secondary | ICD-10-CM | POA: Diagnosis not present

## 2021-11-20 DIAGNOSIS — E876 Hypokalemia: Secondary | ICD-10-CM | POA: Diagnosis not present

## 2021-11-20 DIAGNOSIS — M159 Polyosteoarthritis, unspecified: Secondary | ICD-10-CM | POA: Diagnosis not present

## 2021-11-20 DIAGNOSIS — N202 Calculus of kidney with calculus of ureter: Secondary | ICD-10-CM | POA: Diagnosis not present

## 2021-11-20 DIAGNOSIS — K5730 Diverticulosis of large intestine without perforation or abscess without bleeding: Secondary | ICD-10-CM | POA: Diagnosis not present

## 2021-11-20 DIAGNOSIS — R519 Headache, unspecified: Secondary | ICD-10-CM | POA: Diagnosis not present

## 2021-11-20 DIAGNOSIS — Z936 Other artificial openings of urinary tract status: Secondary | ICD-10-CM | POA: Diagnosis not present

## 2021-11-20 DIAGNOSIS — Z79899 Other long term (current) drug therapy: Secondary | ICD-10-CM | POA: Diagnosis not present

## 2021-11-20 DIAGNOSIS — Z880 Allergy status to penicillin: Secondary | ICD-10-CM | POA: Diagnosis not present

## 2021-11-20 DIAGNOSIS — R110 Nausea: Secondary | ICD-10-CM | POA: Diagnosis not present

## 2021-11-20 DIAGNOSIS — N136 Pyonephrosis: Secondary | ICD-10-CM | POA: Diagnosis not present

## 2021-11-20 DIAGNOSIS — Z87891 Personal history of nicotine dependence: Secondary | ICD-10-CM | POA: Diagnosis not present

## 2021-11-20 DIAGNOSIS — G894 Chronic pain syndrome: Secondary | ICD-10-CM | POA: Diagnosis not present

## 2021-11-20 DIAGNOSIS — I10 Essential (primary) hypertension: Secondary | ICD-10-CM | POA: Diagnosis not present

## 2021-11-20 DIAGNOSIS — N211 Calculus in urethra: Secondary | ICD-10-CM | POA: Diagnosis not present

## 2021-11-20 DIAGNOSIS — N132 Hydronephrosis with renal and ureteral calculous obstruction: Secondary | ICD-10-CM | POA: Diagnosis not present

## 2021-11-21 DIAGNOSIS — Z936 Other artificial openings of urinary tract status: Secondary | ICD-10-CM | POA: Diagnosis not present

## 2021-11-21 DIAGNOSIS — N200 Calculus of kidney: Secondary | ICD-10-CM | POA: Diagnosis not present

## 2021-12-08 DIAGNOSIS — N200 Calculus of kidney: Secondary | ICD-10-CM | POA: Diagnosis not present

## 2021-12-16 DIAGNOSIS — Z882 Allergy status to sulfonamides status: Secondary | ICD-10-CM | POA: Diagnosis not present

## 2021-12-16 DIAGNOSIS — N202 Calculus of kidney with calculus of ureter: Secondary | ICD-10-CM | POA: Diagnosis not present

## 2021-12-16 DIAGNOSIS — G4730 Sleep apnea, unspecified: Secondary | ICD-10-CM | POA: Diagnosis not present

## 2021-12-16 DIAGNOSIS — Z936 Other artificial openings of urinary tract status: Secondary | ICD-10-CM | POA: Diagnosis not present

## 2021-12-16 DIAGNOSIS — I10 Essential (primary) hypertension: Secondary | ICD-10-CM | POA: Diagnosis not present

## 2021-12-16 DIAGNOSIS — E669 Obesity, unspecified: Secondary | ICD-10-CM | POA: Diagnosis not present

## 2021-12-16 DIAGNOSIS — Z880 Allergy status to penicillin: Secondary | ICD-10-CM | POA: Diagnosis not present

## 2021-12-16 DIAGNOSIS — Z6831 Body mass index (BMI) 31.0-31.9, adult: Secondary | ICD-10-CM | POA: Diagnosis not present

## 2021-12-16 DIAGNOSIS — N2882 Megaloureter: Secondary | ICD-10-CM | POA: Diagnosis not present

## 2021-12-16 DIAGNOSIS — G8918 Other acute postprocedural pain: Secondary | ICD-10-CM | POA: Diagnosis not present

## 2021-12-16 DIAGNOSIS — N200 Calculus of kidney: Secondary | ICD-10-CM | POA: Diagnosis not present

## 2021-12-17 DIAGNOSIS — Z6831 Body mass index (BMI) 31.0-31.9, adult: Secondary | ICD-10-CM | POA: Diagnosis not present

## 2021-12-17 DIAGNOSIS — Z880 Allergy status to penicillin: Secondary | ICD-10-CM | POA: Diagnosis not present

## 2021-12-17 DIAGNOSIS — I10 Essential (primary) hypertension: Secondary | ICD-10-CM | POA: Diagnosis not present

## 2021-12-17 DIAGNOSIS — N2882 Megaloureter: Secondary | ICD-10-CM | POA: Diagnosis not present

## 2021-12-17 DIAGNOSIS — Z882 Allergy status to sulfonamides status: Secondary | ICD-10-CM | POA: Diagnosis not present

## 2021-12-17 DIAGNOSIS — N200 Calculus of kidney: Secondary | ICD-10-CM | POA: Diagnosis not present

## 2021-12-17 DIAGNOSIS — Z9889 Other specified postprocedural states: Secondary | ICD-10-CM | POA: Diagnosis not present

## 2021-12-17 DIAGNOSIS — Z960 Presence of urogenital implants: Secondary | ICD-10-CM | POA: Diagnosis not present

## 2021-12-17 DIAGNOSIS — Z936 Other artificial openings of urinary tract status: Secondary | ICD-10-CM | POA: Diagnosis not present

## 2021-12-17 DIAGNOSIS — E669 Obesity, unspecified: Secondary | ICD-10-CM | POA: Diagnosis not present

## 2021-12-17 DIAGNOSIS — G4730 Sleep apnea, unspecified: Secondary | ICD-10-CM | POA: Diagnosis not present

## 2021-12-17 DIAGNOSIS — N202 Calculus of kidney with calculus of ureter: Secondary | ICD-10-CM | POA: Diagnosis not present

## 2022-01-07 DIAGNOSIS — I10 Essential (primary) hypertension: Secondary | ICD-10-CM | POA: Diagnosis not present

## 2022-01-07 DIAGNOSIS — R9431 Abnormal electrocardiogram [ECG] [EKG]: Secondary | ICD-10-CM | POA: Diagnosis not present

## 2022-02-03 DIAGNOSIS — N200 Calculus of kidney: Secondary | ICD-10-CM | POA: Diagnosis not present

## 2022-02-03 DIAGNOSIS — N2889 Other specified disorders of kidney and ureter: Secondary | ICD-10-CM | POA: Diagnosis not present

## 2022-02-08 DIAGNOSIS — Z09 Encounter for follow-up examination after completed treatment for conditions other than malignant neoplasm: Secondary | ICD-10-CM | POA: Diagnosis not present

## 2022-02-08 DIAGNOSIS — N200 Calculus of kidney: Secondary | ICD-10-CM | POA: Diagnosis not present

## 2022-02-08 DIAGNOSIS — N2589 Other disorders resulting from impaired renal tubular function: Secondary | ICD-10-CM | POA: Diagnosis not present

## 2022-03-21 ENCOUNTER — Telehealth: Payer: Self-pay | Admitting: Licensed Clinical Social Worker

## 2022-03-21 ENCOUNTER — Ambulatory Visit: Payer: Self-pay | Admitting: Licensed Clinical Social Worker

## 2022-03-21 NOTE — Patient Outreach (Signed)
A user error has taken place: encounter opened in error, closed for administrative reasons.

## 2022-03-21 NOTE — Patient Outreach (Signed)
  Care Coordination   03/21/2022 Name: NGOZI ALVIDREZ MRN: [id number] DOB: 24-Dec-1955   Care Coordination Outreach Attempts:  An unsuccessful telephone outreach was attempted today to offer the patient information about available care coordination services as a benefit of their health plan.   Follow Up Plan:  Additional outreach attempts will be made to offer the patient care coordination information and services.   Encounter Outcome:  No Answer  Care Coordination Interventions Activated:  No   Care Coordination Interventions:  No, not indicated    Casimer Lanius, [place] [phone]

## 2022-04-01 ENCOUNTER — Telehealth: Payer: Self-pay | Admitting: *Deleted

## 2022-04-01 ENCOUNTER — Encounter: Payer: Self-pay | Admitting: *Deleted

## 2022-04-01 NOTE — Patient Outreach (Signed)
  Care Coordination   04/01/2022 Name: Danielle [name] MRN: [id number] DOB: 09-Feb-1956   Care Coordination Outreach Attempts:  A second unsuccessful outreach was attempted today to offer the patient with information about available care coordination services as a benefit of their health plan.     Follow Up Plan:  Additional outreach attempts will be made to offer the patient care coordination information and services.   Encounter Outcome:  No Answer left voice message requesting call back  Care Coordination Interventions Activated:  No   Care Coordination Interventions:  No, not indicated unsuccessful outreach attempt # 2   Oneta Rack, RN, BSN, CCRN Alumnus RN CM Care Coordination/ Transition of [place] [facility] [phone]: direct office

## 2022-04-04 ENCOUNTER — Encounter: Payer: Self-pay | Admitting: *Deleted

## 2022-04-04 ENCOUNTER — Telehealth: Payer: Self-pay | Admitting: *Deleted

## 2022-04-04 NOTE — Patient Outreach (Signed)
  Care Coordination   04/04/2022 Name: Danielle [name] MRN: [id number] DOB: [birth year]   Care Coordination Outreach Attempts:  A third unsuccessful outreach was attempted today to offer the patient with information about available care coordination services as a benefit of their health plan.   Follow Up Plan:  No further outreach attempts will be made at this time. We have been unable to contact the patient to offer or enroll patient in care coordination services  Encounter Outcome:  No Answer left voice message requesting call back  Care Coordination Interventions Activated:  No   Care Coordination Interventions:  No, not indicated unsuccessful consecutive outreach attempt # [place], RN, BSN, CCRN Alumnus RN CM Care Coordination/ Transition of [place] [facility] [phone]: direct office

## 2022-04-06 DIAGNOSIS — N200 Calculus of kidney: Secondary | ICD-10-CM | POA: Diagnosis not present

## 2022-04-06 DIAGNOSIS — N2589 Other disorders resulting from impaired renal tubular function: Secondary | ICD-10-CM | POA: Diagnosis not present

## 2022-05-10 DIAGNOSIS — I10 Essential (primary) hypertension: Secondary | ICD-10-CM | POA: Diagnosis not present

## 2022-05-16 DIAGNOSIS — Z0000 Encounter for general adult medical examination without abnormal findings: Secondary | ICD-10-CM | POA: Diagnosis not present

## 2022-05-16 DIAGNOSIS — Z1389 Encounter for screening for other disorder: Secondary | ICD-10-CM | POA: Diagnosis not present

## 2022-05-16 DIAGNOSIS — Z0131 Encounter for examination of blood pressure with abnormal findings: Secondary | ICD-10-CM | POA: Diagnosis not present

## 2022-05-16 DIAGNOSIS — Z23 Encounter for immunization: Secondary | ICD-10-CM | POA: Diagnosis not present

## 2022-05-16 DIAGNOSIS — Z1331 Encounter for screening for depression: Secondary | ICD-10-CM | POA: Diagnosis not present

## 2022-05-24 DIAGNOSIS — R109 Unspecified abdominal pain: Secondary | ICD-10-CM | POA: Diagnosis not present

## 2022-05-24 DIAGNOSIS — G8929 Other chronic pain: Secondary | ICD-10-CM | POA: Diagnosis not present

## 2022-05-26 DIAGNOSIS — I10 Essential (primary) hypertension: Secondary | ICD-10-CM | POA: Diagnosis not present

## 2022-08-26 DIAGNOSIS — M85852 Other specified disorders of bone density and structure, left thigh: Secondary | ICD-10-CM | POA: Diagnosis not present

## 2022-08-26 DIAGNOSIS — Z1231 Encounter for screening mammogram for malignant neoplasm of breast: Secondary | ICD-10-CM | POA: Diagnosis not present

## 2022-08-26 DIAGNOSIS — M85832 Other specified disorders of bone density and structure, left forearm: Secondary | ICD-10-CM | POA: Diagnosis not present

## 2022-08-26 DIAGNOSIS — Z780 Asymptomatic menopausal state: Secondary | ICD-10-CM | POA: Diagnosis not present

## 2022-09-24 DIAGNOSIS — R519 Headache, unspecified: Secondary | ICD-10-CM | POA: Diagnosis not present

## 2022-09-24 DIAGNOSIS — R109 Unspecified abdominal pain: Secondary | ICD-10-CM | POA: Diagnosis not present

## 2022-09-24 DIAGNOSIS — R202 Paresthesia of skin: Secondary | ICD-10-CM | POA: Diagnosis not present

## 2022-09-24 DIAGNOSIS — R079 Chest pain, unspecified: Secondary | ICD-10-CM | POA: Diagnosis not present

## 2022-09-24 DIAGNOSIS — E785 Hyperlipidemia, unspecified: Secondary | ICD-10-CM | POA: Diagnosis not present

## 2022-09-24 DIAGNOSIS — M25522 Pain in left elbow: Secondary | ICD-10-CM | POA: Diagnosis not present

## 2022-09-24 DIAGNOSIS — E876 Hypokalemia: Secondary | ICD-10-CM | POA: Diagnosis not present

## 2022-09-24 DIAGNOSIS — M25511 Pain in right shoulder: Secondary | ICD-10-CM | POA: Diagnosis not present

## 2022-09-24 DIAGNOSIS — H9313 Tinnitus, bilateral: Secondary | ICD-10-CM | POA: Diagnosis not present

## 2022-09-24 DIAGNOSIS — H9319 Tinnitus, unspecified ear: Secondary | ICD-10-CM | POA: Diagnosis not present

## 2022-09-24 DIAGNOSIS — R0600 Dyspnea, unspecified: Secondary | ICD-10-CM | POA: Diagnosis not present

## 2022-09-24 DIAGNOSIS — Z20822 Contact with and (suspected) exposure to covid-19: Secondary | ICD-10-CM | POA: Diagnosis not present

## 2022-09-24 DIAGNOSIS — R0602 Shortness of breath: Secondary | ICD-10-CM | POA: Diagnosis not present

## 2022-09-25 DIAGNOSIS — H9319 Tinnitus, unspecified ear: Secondary | ICD-10-CM | POA: Diagnosis not present

## 2022-09-28 DIAGNOSIS — E876 Hypokalemia: Secondary | ICD-10-CM | POA: Diagnosis not present

## 2022-09-28 DIAGNOSIS — Z1389 Encounter for screening for other disorder: Secondary | ICD-10-CM | POA: Diagnosis not present

## 2022-09-28 DIAGNOSIS — Z130 Encounter for screening for diseases of the blood and blood-forming organs and certain disorders involving the immune mechanism: Secondary | ICD-10-CM | POA: Diagnosis not present

## 2022-09-28 DIAGNOSIS — H9313 Tinnitus, bilateral: Secondary | ICD-10-CM | POA: Diagnosis not present

## 2022-09-28 DIAGNOSIS — Z0130 Encounter for examination of blood pressure without abnormal findings: Secondary | ICD-10-CM | POA: Diagnosis not present

## 2022-09-28 DIAGNOSIS — R0602 Shortness of breath: Secondary | ICD-10-CM | POA: Diagnosis not present

## 2022-09-28 DIAGNOSIS — Z0131 Encounter for examination of blood pressure with abnormal findings: Secondary | ICD-10-CM | POA: Diagnosis not present

## 2022-10-18 DIAGNOSIS — N200 Calculus of kidney: Secondary | ICD-10-CM | POA: Diagnosis not present

## 2022-10-18 DIAGNOSIS — N390 Urinary tract infection, site not specified: Secondary | ICD-10-CM | POA: Diagnosis not present

## 2022-10-18 DIAGNOSIS — N281 Cyst of kidney, acquired: Secondary | ICD-10-CM | POA: Diagnosis not present

## 2022-10-18 DIAGNOSIS — N261 Atrophy of kidney (terminal): Secondary | ICD-10-CM | POA: Diagnosis not present

## 2022-10-18 DIAGNOSIS — N2589 Other disorders resulting from impaired renal tubular function: Secondary | ICD-10-CM | POA: Diagnosis not present

## 2022-11-01 DIAGNOSIS — R0602 Shortness of breath: Secondary | ICD-10-CM | POA: Diagnosis not present

## 2022-11-01 DIAGNOSIS — H9313 Tinnitus, bilateral: Secondary | ICD-10-CM | POA: Diagnosis not present

## 2022-11-01 DIAGNOSIS — Z1389 Encounter for screening for other disorder: Secondary | ICD-10-CM | POA: Diagnosis not present

## 2022-11-01 DIAGNOSIS — E669 Obesity, unspecified: Secondary | ICD-10-CM | POA: Diagnosis not present

## 2022-11-01 DIAGNOSIS — K432 Incisional hernia without obstruction or gangrene: Secondary | ICD-10-CM | POA: Diagnosis not present

## 2022-11-01 DIAGNOSIS — Z0131 Encounter for examination of blood pressure with abnormal findings: Secondary | ICD-10-CM | POA: Diagnosis not present

## 2022-11-01 DIAGNOSIS — I10 Essential (primary) hypertension: Secondary | ICD-10-CM | POA: Diagnosis not present

## 2022-11-02 DIAGNOSIS — N200 Calculus of kidney: Secondary | ICD-10-CM | POA: Diagnosis not present

## 2022-11-02 DIAGNOSIS — N2589 Other disorders resulting from impaired renal tubular function: Secondary | ICD-10-CM | POA: Diagnosis not present

## 2022-11-16 DIAGNOSIS — R0602 Shortness of breath: Secondary | ICD-10-CM | POA: Diagnosis not present

## 2022-11-23 DIAGNOSIS — I10 Essential (primary) hypertension: Secondary | ICD-10-CM | POA: Diagnosis not present

## 2022-11-23 DIAGNOSIS — G894 Chronic pain syndrome: Secondary | ICD-10-CM | POA: Diagnosis not present

## 2022-11-23 DIAGNOSIS — Z0131 Encounter for examination of blood pressure with abnormal findings: Secondary | ICD-10-CM | POA: Diagnosis not present

## 2022-11-23 DIAGNOSIS — L570 Actinic keratosis: Secondary | ICD-10-CM | POA: Diagnosis not present

## 2022-11-23 DIAGNOSIS — K429 Umbilical hernia without obstruction or gangrene: Secondary | ICD-10-CM | POA: Diagnosis not present

## 2022-11-23 DIAGNOSIS — Z1389 Encounter for screening for other disorder: Secondary | ICD-10-CM | POA: Diagnosis not present

## 2022-11-23 DIAGNOSIS — L209 Atopic dermatitis, unspecified: Secondary | ICD-10-CM | POA: Diagnosis not present

## 2023-01-30 DIAGNOSIS — K432 Incisional hernia without obstruction or gangrene: Secondary | ICD-10-CM | POA: Diagnosis not present

## 2023-01-31 DIAGNOSIS — Z1389 Encounter for screening for other disorder: Secondary | ICD-10-CM | POA: Diagnosis not present

## 2023-01-31 DIAGNOSIS — K432 Incisional hernia without obstruction or gangrene: Secondary | ICD-10-CM | POA: Diagnosis not present

## 2023-01-31 DIAGNOSIS — R110 Nausea: Secondary | ICD-10-CM | POA: Diagnosis not present

## 2023-01-31 DIAGNOSIS — E669 Obesity, unspecified: Secondary | ICD-10-CM | POA: Diagnosis not present

## 2023-01-31 DIAGNOSIS — Z0131 Encounter for examination of blood pressure with abnormal findings: Secondary | ICD-10-CM | POA: Diagnosis not present

## 2023-01-31 DIAGNOSIS — R7303 Prediabetes: Secondary | ICD-10-CM | POA: Diagnosis not present

## 2023-02-09 DIAGNOSIS — K432 Incisional hernia without obstruction or gangrene: Secondary | ICD-10-CM | POA: Diagnosis not present
